# Patient Record
Sex: Female | Born: 1950
Health system: Southern US, Community
[De-identification: ages and names within clinical notes are randomized; demographics above are authoritative.]

## PROBLEM LIST (undated history)

## (undated) DIAGNOSIS — N6310 Unspecified lump in the right breast, unspecified quadrant: Secondary | ICD-10-CM

## (undated) DIAGNOSIS — E785 Hyperlipidemia, unspecified: Secondary | ICD-10-CM

## (undated) DIAGNOSIS — E559 Vitamin D deficiency, unspecified: Secondary | ICD-10-CM

## (undated) DIAGNOSIS — M810 Age-related osteoporosis without current pathological fracture: Secondary | ICD-10-CM

## (undated) HISTORY — DX: Hyperlipidemia, unspecified: E78.5

## (undated) HISTORY — PX: HERNIA REPAIR: SHX51

## (undated) HISTORY — DX: Age-related osteoporosis without current pathological fracture: M81.0

## (undated) HISTORY — PX: BREAST SURGERY: SHX581

## (undated) HISTORY — PX: COLONOSCOPY: SHX174

## (undated) HISTORY — PX: BREAST EXCISIONAL BIOPSY: SUR124

## (undated) HISTORY — DX: Vitamin D deficiency, unspecified: E55.9

---

## 1998-03-20 ENCOUNTER — Other Ambulatory Visit: Admission: RE | Admit: 1998-03-20 | Discharge: 1998-03-20 | Payer: Self-pay | Admitting: Obstetrics and Gynecology

## 1999-05-19 ENCOUNTER — Other Ambulatory Visit: Admission: RE | Admit: 1999-05-19 | Discharge: 1999-05-19 | Payer: Self-pay | Admitting: Obstetrics and Gynecology

## 2000-06-09 ENCOUNTER — Other Ambulatory Visit: Admission: RE | Admit: 2000-06-09 | Discharge: 2000-06-09 | Payer: Self-pay | Admitting: Obstetrics and Gynecology

## 2002-01-23 ENCOUNTER — Other Ambulatory Visit: Admission: RE | Admit: 2002-01-23 | Discharge: 2002-01-23 | Payer: Self-pay | Admitting: Obstetrics and Gynecology

## 2002-12-02 ENCOUNTER — Emergency Department (HOSPITAL_COMMUNITY): Admission: EM | Admit: 2002-12-02 | Discharge: 2002-12-02 | Payer: Self-pay | Admitting: *Deleted

## 2003-02-07 ENCOUNTER — Other Ambulatory Visit: Admission: RE | Admit: 2003-02-07 | Discharge: 2003-02-07 | Payer: Self-pay | Admitting: Obstetrics and Gynecology

## 2003-06-11 ENCOUNTER — Ambulatory Visit (HOSPITAL_COMMUNITY): Admission: RE | Admit: 2003-06-11 | Discharge: 2003-06-11 | Payer: Self-pay | Admitting: Gastroenterology

## 2003-06-11 ENCOUNTER — Encounter (INDEPENDENT_AMBULATORY_CARE_PROVIDER_SITE_OTHER): Payer: Self-pay | Admitting: *Deleted

## 2004-02-18 ENCOUNTER — Other Ambulatory Visit: Admission: RE | Admit: 2004-02-18 | Discharge: 2004-02-18 | Payer: Self-pay | Admitting: Obstetrics and Gynecology

## 2005-03-05 ENCOUNTER — Other Ambulatory Visit: Admission: RE | Admit: 2005-03-05 | Discharge: 2005-03-05 | Payer: Self-pay | Admitting: Obstetrics and Gynecology

## 2006-08-25 ENCOUNTER — Other Ambulatory Visit: Admission: RE | Admit: 2006-08-25 | Discharge: 2006-08-25 | Payer: Self-pay | Admitting: Obstetrics and Gynecology

## 2009-02-06 LAB — HM DEXA SCAN

## 2010-06-30 ENCOUNTER — Encounter: Payer: Self-pay | Admitting: Nurse Practitioner

## 2010-06-30 DIAGNOSIS — M8580 Other specified disorders of bone density and structure, unspecified site: Secondary | ICD-10-CM | POA: Insufficient documentation

## 2010-06-30 DIAGNOSIS — E785 Hyperlipidemia, unspecified: Secondary | ICD-10-CM | POA: Insufficient documentation

## 2010-06-30 DIAGNOSIS — E559 Vitamin D deficiency, unspecified: Secondary | ICD-10-CM | POA: Insufficient documentation

## 2010-07-25 NOTE — Op Note (Signed)
NAME:  CROWDERCherylee, Rawlinson                         ACCOUNT NO.:  [id number]   MEDICAL RECORD NO.:  [id number]                   PATIENT TYPE:  AMB   LOCATION:  ENDO                                 FACILITY:  [facility]   PHYSICIAN:  Anselmo Rod, M.D.               DATE OF BIRTH:  1950-04-11   DATE OF PROCEDURE:  06/11/2003  DATE OF DISCHARGE:                                 OPERATIVE REPORT   PROCEDURE PERFORMED:  Colonoscopy with cold biopsies times two.   ENDOSCOPIST:  Charna Elizabeth, M.D.   INSTRUMENT USED:  Olympus video colonoscope.   INDICATIONS FOR PROCEDURE:  The patient is a 60 year old white female with a  history of guaiac positive stool undergoing screening colonoscopy.  Rule out  colonic polyps, masses, etc.   PREPROCEDURE PREPARATION:  Informed consent was procured from the patient.  The patient was fasted for eight hours prior to the procedure and prepped  with a bottle of magnesium citrate and a gallon of GoLYTELY the night prior  to the procedure.   PREPROCEDURE PHYSICAL:  The patient had stable vital signs.  Neck supple.  Chest clear to auscultation.  S1 and S2 regular.  Abdomen soft with normal  bowel sounds.   DESCRIPTION OF PROCEDURE:  The patient was placed in left lateral decubitus  position and sedated with 70 mg of Demerol and 7 mg of Versed intravenously.  Once the patient was adequately sedated and maintained on low flow oxygen  and continuous cardiac monitoring, the Olympus video colonoscope was  advanced from the rectum to the cecum.  A small sessile polyp was biopsied  from the rectum.  The rest of the exam up to the cecum was normal.  The  appendicular orifice and ileocecal valve were clearly visualized and  photographed.  The patient tolerated the procedure well without immediate  complications.   IMPRESSION:  Normal colonoscopy up to the cecum except for small polyp  biopsied from the rectum.   RECOMMENDATIONS:  1. Await pathology results.  2.  Outpatient followup and repeat guaiac testing.  3. Repeat CRC screening depending on pathology results.                                               Anselmo Rod, M.D.   JNM/MEDQ  D:  06/11/2003  T:  06/12/2003  Job:  [id number]   cc:   Fayrene Fearing A. Ashley Royalty, M.D.  [address]  [place], [place] [address]  Fax: [phone]

## 2010-12-19 ENCOUNTER — Other Ambulatory Visit: Payer: Self-pay | Admitting: Obstetrics and Gynecology

## 2012-06-06 ENCOUNTER — Other Ambulatory Visit: Payer: Self-pay

## 2012-06-06 MED ORDER — EZETIMIBE 10 MG PO TABS: 10.0000 mg | ORAL_TABLET | Freq: Every day | ORAL | Status: DC

## 2012-06-20 ENCOUNTER — Other Ambulatory Visit: Payer: Self-pay

## 2012-06-20 MED ORDER — FENOFIBRATE 160 MG PO TABS: 160.0000 mg | ORAL_TABLET | Freq: Every day | ORAL | Status: DC

## 2012-07-04 ENCOUNTER — Ambulatory Visit (INDEPENDENT_AMBULATORY_CARE_PROVIDER_SITE_OTHER): Payer: BC Managed Care – PPO | Admitting: Nurse Practitioner

## 2012-07-04 ENCOUNTER — Encounter: Payer: Self-pay | Admitting: Nurse Practitioner

## 2012-07-04 VITALS — BP 127/83 | HR 78 | Temp 98.0°F | Ht 65.0 in | Wt 155.0 lb

## 2012-07-04 DIAGNOSIS — E785 Hyperlipidemia, unspecified: Secondary | ICD-10-CM

## 2012-07-04 DIAGNOSIS — E559 Vitamin D deficiency, unspecified: Secondary | ICD-10-CM

## 2012-07-04 LAB — COMPLETE METABOLIC PANEL WITH GFR
ALT: 19 U/L (ref 0–35)
AST: 18 U/L (ref 0–37)
Alkaline Phosphatase: 33 U/L — ABNORMAL LOW (ref 39–117)
BUN: 25 mg/dL — ABNORMAL HIGH (ref 6–23)
Calcium: 9.6 mg/dL (ref 8.4–10.5)
Creat: 0.95 mg/dL (ref 0.50–1.10)
Total Bilirubin: 0.4 mg/dL (ref 0.3–1.2)

## 2012-07-04 MED ORDER — EZETIMIBE 10 MG PO TABS: 10.0000 mg | ORAL_TABLET | Freq: Every day | ORAL | Status: DC

## 2012-07-04 MED ORDER — FENOFIBRATE 160 MG PO TABS: 160.0000 mg | ORAL_TABLET | Freq: Every day | ORAL | Status: DC

## 2012-07-04 NOTE — Patient Instructions (Signed)

## 2012-07-04 NOTE — Progress Notes (Signed)
  Subjective:    Patient ID: Lori Ross, female    DOB: 05/17/1950, 62 y.o.   MRN: [id number]  Hyperlipidemia This is a chronic problem. The current episode started more than 1 year ago. The problem is uncontrolled. Recent lipid tests were reviewed and are high. There are no known factors aggravating her hyperlipidemia. Pertinent negatives include no chest pain, focal sensory loss, focal weakness, leg pain or shortness of breath. Current antihyperlipidemic treatment includes ezetimibe and fibric acid derivatives (can't tolerate statin). The current treatment provides moderate improvement of lipids. Compliance problems include adherence to diet.  Risk factors for coronary artery disease include post-menopausal.  Vit D deficiency Vitamin D OTC. No c/o of fatigue.    Review of Systems  Constitutional: Negative.   Respiratory: Negative for shortness of breath.   Cardiovascular: Negative for chest pain, palpitations and leg swelling.  Gastrointestinal: Negative.   Genitourinary: Negative.   Neurological: Negative.  Negative for focal weakness.  Psychiatric/Behavioral: Negative.        Objective:   Physical Exam  Constitutional: She is oriented to person, place, and time. She appears well-developed and well-nourished.  HENT:  Head: Normocephalic.  Right Ear: Hearing, tympanic membrane, external ear and ear canal normal.  Left Ear: Hearing, tympanic membrane, external ear and ear canal normal.  Nose: Nose normal.  Mouth/Throat: Uvula is midline, oropharynx is clear and moist and mucous membranes are normal.  Eyes: Conjunctivae and EOM are normal. Pupils are equal, round, and reactive to light.  Neck: Normal range of motion. Neck supple. No JVD present. Carotid bruit is not present. No thyromegaly present.  Cardiovascular: Normal rate, normal heart sounds and intact distal pulses.   No murmur heard. Pulmonary/Chest: Effort normal and breath sounds normal. She has no wheezes. She has no  rales.  Abdominal: Soft. Bowel sounds are normal. She exhibits no mass. There is no tenderness.  Musculoskeletal: Normal range of motion.  Neurological: She is alert and oriented to person, place, and time. She has normal reflexes.  Skin: Skin is warm and dry.  Psychiatric: She has a normal mood and affect. Her behavior is normal. Judgment and thought content normal.  BP 127/83  Pulse 78  Temp(Src) 98 F (36.7 C) (Oral)  Ht 5\' 5"  (1.651 m)  Wt 155 lb (70.308 kg)  BMI 25.79 kg/m2         Assessment & Plan:  1. Hyperlipidemia Low fat diet and exercise - COMPLETE METABOLIC PANEL WITH GFR - NMR Lipoprofile with Lipids - fenofibrate 160 MG tablet; Take 1 tablet (160 mg total) by mouth daily.  Dispense: 30 tablet; Refill: 5 - ezetimibe (ZETIA) 10 MG tablet; Take 1 tablet (10 mg total) by mouth daily.  Dispense: 30 tablet; Refill: 5  2. Unspecified vitamin D deficiency Continue Vit d as directed  Mary-Margaret Daphine Deutscher, FNP

## 2012-07-05 LAB — NMR LIPOPROFILE WITH LIPIDS
Cholesterol, Total: 179 mg/dL (ref <200)
HDL Particle Number: 45.1 umol/L (ref >=30.5)
HDL-C: 49 mg/dL (ref >=40)
LDL (calc): 102 mg/dL — ABNORMAL HIGH (ref <100)
LDL Particle Number: 1761 nmol/L — ABNORMAL HIGH (ref <1000)
LP-IR Score: 83 — ABNORMAL HIGH (ref <=45)
Small LDL Particle Number: 1052 nmol/L — ABNORMAL HIGH (ref <=527)
Triglycerides: 139 mg/dL (ref <150)
VLDL Size: 54.8 nm — ABNORMAL HIGH (ref <=46.6)

## 2012-07-05 LAB — VITAMIN D 25 HYDROXY (VIT D DEFICIENCY, FRACTURES): Vit D, 25-Hydroxy: 39 ng/mL (ref 30–89)

## 2012-10-05 ENCOUNTER — Encounter: Payer: Self-pay | Admitting: Nurse Practitioner

## 2012-10-05 ENCOUNTER — Ambulatory Visit (INDEPENDENT_AMBULATORY_CARE_PROVIDER_SITE_OTHER): Payer: BC Managed Care – PPO | Admitting: Nurse Practitioner

## 2012-10-05 VITALS — BP 130/84 | HR 74 | Temp 97.5°F | Ht 65.0 in | Wt 153.5 lb

## 2012-10-05 DIAGNOSIS — E785 Hyperlipidemia, unspecified: Secondary | ICD-10-CM

## 2012-10-05 MED ORDER — EZETIMIBE 10 MG PO TABS: 10.0000 mg | ORAL_TABLET | Freq: Every day | ORAL | Status: DC

## 2012-10-05 MED ORDER — FENOFIBRATE 160 MG PO TABS: 160.0000 mg | ORAL_TABLET | Freq: Every day | ORAL | Status: DC

## 2012-10-05 NOTE — Progress Notes (Signed)
  Subjective:    Patient ID: Lori Ross, female    DOB: 02/15/1951, 62 y.o.   MRN: [id number]  Hyperlipidemia This is a chronic problem. The current episode started more than 1 year ago. The problem is uncontrolled. Recent lipid tests were reviewed and are high. She has no history of diabetes or liver disease. Pertinent negatives include no leg pain, myalgias or shortness of breath. Current antihyperlipidemic treatment includes fibric acid derivatives and ezetimibe. The current treatment provides mild improvement of lipids. Compliance problems include medication side effects.  Risk factors for coronary artery disease include family history and dyslipidemia.      Review of Systems  Respiratory: Negative for shortness of breath.   Musculoskeletal: Negative for myalgias.  All other systems reviewed and are negative.       Objective:   Physical Exam  Vitals reviewed. Constitutional: She is oriented to person, place, and time. She appears well-developed and well-nourished.  HENT:  Head: Normocephalic.  Right Ear: External ear normal.  Left Ear: External ear normal.  Mouth/Throat: Oropharynx is clear and moist.  Eyes: Pupils are equal, round, and reactive to light.  Neck: Normal range of motion. Neck supple. No thyromegaly present.  Cardiovascular: Normal rate, regular rhythm, normal heart sounds and intact distal pulses.   Pulmonary/Chest: Effort normal and breath sounds normal.  Abdominal: Soft. Bowel sounds are normal. She exhibits no distension. There is no tenderness.  Musculoskeletal: Normal range of motion. She exhibits no edema and no tenderness.  Neurological: She is alert and oriented to person, place, and time.  Skin: Skin is warm and dry.  Psychiatric: She has a normal mood and affect. Her behavior is normal. Judgment and thought content normal.    BP 130/84  Pulse 74  Temp(Src) 97.5 F (36.4 C) (Oral)  Ht 5\' 5"  (1.651 m)  Wt 153 lb 8 oz (69.627 kg)  BMI 25.54  kg/m2       Assessment & Plan:  1. Hyperlipidemia *Low fat diet and exercise - CMP14+EGFR - NMR, lipoprofile - fenofibrate 160 MG tablet; Take 1 tablet (160 mg total) by mouth daily.  Dispense: 30 tablet; Refill: 5 - ezetimibe (ZETIA) 10 MG tablet; Take 1 tablet (10 mg total) by mouth daily.  Dispense: 30 tablet; Refill: 5 Follow up in 6 months Mary-Margaret Daphine Deutscher, FNP

## 2012-10-05 NOTE — Patient Instructions (Addendum)

## 2012-10-11 LAB — NMR, LIPOPROFILE
Cholesterol: 179 mg/dL (ref <200)
HDL Cholesterol by NMR: 44 mg/dL (ref >=40)
LDL Particle Number: 1675 nmol/L — ABNORMAL HIGH (ref <1000)
LDLC SERPL CALC-MCNC: 95 mg/dL (ref <100)
LP-IR Score: 74 — ABNORMAL HIGH (ref <=45)

## 2012-10-11 LAB — CMP14+EGFR
AST: 25 IU/L (ref 0–40)
Albumin: 4.7 g/dL (ref 3.6–4.8)
Alkaline Phosphatase: 39 IU/L (ref 39–117)
BUN/Creatinine Ratio: 27 — ABNORMAL HIGH (ref 11–26)
BUN: 25 mg/dL (ref 8–27)
CO2: 26 mmol/L (ref 18–29)
Calcium: 10.3 mg/dL — ABNORMAL HIGH (ref 8.6–10.2)
Chloride: 103 mmol/L (ref 97–108)

## 2012-10-12 ENCOUNTER — Other Ambulatory Visit: Payer: Self-pay | Admitting: Nurse Practitioner

## 2012-10-12 MED ORDER — ATORVASTATIN CALCIUM 40 MG PO TABS: 40.0000 mg | ORAL_TABLET | Freq: Every day | ORAL | Status: DC

## 2012-10-17 ENCOUNTER — Other Ambulatory Visit (INDEPENDENT_AMBULATORY_CARE_PROVIDER_SITE_OTHER): Payer: BC Managed Care – PPO

## 2012-10-17 DIAGNOSIS — Z1212 Encounter for screening for malignant neoplasm of rectum: Secondary | ICD-10-CM

## 2012-10-17 NOTE — Progress Notes (Signed)
Pt dropped off FOBT  

## 2012-10-19 LAB — FECAL OCCULT BLOOD, IMMUNOCHEMICAL: Fecal Occult Bld: NEGATIVE

## 2013-01-06 ENCOUNTER — Encounter: Payer: Self-pay | Admitting: Nurse Practitioner

## 2013-01-06 ENCOUNTER — Ambulatory Visit (INDEPENDENT_AMBULATORY_CARE_PROVIDER_SITE_OTHER): Payer: BC Managed Care – PPO | Admitting: Nurse Practitioner

## 2013-01-06 VITALS — BP 128/86 | HR 77 | Temp 97.8°F | Ht 65.0 in | Wt 154.0 lb

## 2013-01-06 DIAGNOSIS — E785 Hyperlipidemia, unspecified: Secondary | ICD-10-CM

## 2013-01-06 DIAGNOSIS — E559 Vitamin D deficiency, unspecified: Secondary | ICD-10-CM

## 2013-01-06 NOTE — Patient Instructions (Signed)

## 2013-01-06 NOTE — Progress Notes (Signed)
  Subjective:    Patient ID: Lori Ross, female    DOB: [birth year], 62 y.o.   MRN: [id number]  Hyperlipidemia This is a chronic problem. The current episode started more than 1 year ago. The problem is uncontrolled. Recent lipid tests were reviewed and are high. She has no history of diabetes. There are no known factors aggravating her hyperlipidemia. Associated symptoms include leg pain and myalgias. Pertinent negatives include no chest pain, focal sensory loss, focal weakness or shortness of breath. Current antihyperlipidemic treatment includes ezetimibe, fibric acid derivatives and statins. The current treatment provides moderate improvement of lipids. Compliance problems include adherence to diet and medication side effects.  Risk factors for coronary artery disease include post-menopausal and dyslipidemia.  Vit D deficiency Vitamin D OTC. No c/o of fatigue.    Review of Systems  Constitutional: Negative.   Respiratory: Negative for shortness of breath.   Cardiovascular: Negative for chest pain, palpitations and leg swelling.  Gastrointestinal: Negative.   Genitourinary: Negative.   Musculoskeletal: Positive for myalgias.  Neurological: Negative.  Negative for focal weakness.  Psychiatric/Behavioral: Negative.   All other systems reviewed and are negative.       Objective:   Physical Exam  Constitutional: She is oriented to person, place, and time. She appears well-developed and well-nourished.  HENT:  Head: Normocephalic.  Right Ear: Hearing, tympanic membrane, external ear and ear canal normal.  Left Ear: Hearing, tympanic membrane, external ear and ear canal normal.  Nose: Nose normal.  Mouth/Throat: Uvula is midline, oropharynx is clear and moist and mucous membranes are normal.  Eyes: Conjunctivae and EOM are normal. Pupils are equal, round, and reactive to light.  Neck: Normal range of motion. Neck supple. No JVD present. Carotid bruit is not present. No thyromegaly  present.  Cardiovascular: Normal rate, normal heart sounds and intact distal pulses.   No murmur heard. Pulmonary/Chest: Effort normal and breath sounds normal. She has no wheezes. She has no rales.  Abdominal: Soft. Bowel sounds are normal. She exhibits no mass. There is no tenderness.  Musculoskeletal: Normal range of motion.  Neurological: She is alert and oriented to person, place, and time. She has normal reflexes.  Skin: Skin is warm and dry.  Psychiatric: She has a normal mood and affect. Her behavior is normal. Judgment and thought content normal.  BP 128/86  Pulse 77  Temp(Src) 97.8 F (36.6 C) (Oral)  Ht 5\' 5"  (1.651 m)  Wt 154 lb (69.854 kg)  BMI 25.63 kg/m2         Assessment & Plan:   1. Hyperlipidemia LDL goal < 100   2. Unspecified vitamin D deficiency    Orders Placed This Encounter  Procedures  . CMP14+EGFR  . NMR, lipoprofile  . Vit D  25 hydroxy (rtn osteoporosis monitoring)    Continue all meds Labs pending Diet and exercise encouraged Health maintenance reviewed Follow up in 3 months  Mary-Margaret Daphine Deutscher, FNP

## 2013-01-08 LAB — CMP14+EGFR
ALT: 27 IU/L (ref 0–32)
AST: 27 IU/L (ref 0–40)
Albumin/Globulin Ratio: 1.8 (ref 1.1–2.5)
CO2: 24 mmol/L (ref 18–29)
Calcium: 10.3 mg/dL — ABNORMAL HIGH (ref 8.6–10.2)
Creatinine, Ser: 0.78 mg/dL (ref 0.57–1.00)
GFR calc non Af Amer: 82 mL/min/{1.73_m2} (ref >59)
Globulin, Total: 2.6 g/dL (ref 1.5–4.5)
Glucose: 93 mg/dL (ref 65–99)
Potassium: 5 mmol/L (ref 3.5–5.2)
Sodium: 142 mmol/L (ref 134–144)
Total Protein: 7.4 g/dL (ref 6.0–8.5)

## 2013-01-08 LAB — VITAMIN D 25 HYDROXY (VIT D DEFICIENCY, FRACTURES): Vit D, 25-Hydroxy: 35 ng/mL (ref 30.0–100.0)

## 2013-01-08 LAB — NMR, LIPOPROFILE
HDL Cholesterol by NMR: 45 mg/dL (ref >=40)
LDLC SERPL CALC-MCNC: 62 mg/dL (ref <100)
Small LDL Particle Number: 574 nmol/L — ABNORMAL HIGH (ref <=527)
Triglycerides by NMR: 83 mg/dL (ref <150)

## 2013-01-09 ENCOUNTER — Encounter: Payer: Self-pay | Admitting: Nurse Practitioner

## 2013-01-11 ENCOUNTER — Ambulatory Visit: Payer: BC Managed Care – PPO | Admitting: Nurse Practitioner

## 2013-01-12 ENCOUNTER — Other Ambulatory Visit: Payer: Self-pay | Admitting: Obstetrics and Gynecology

## 2013-01-27 ENCOUNTER — Ambulatory Visit: Payer: BC Managed Care – PPO | Admitting: Nurse Practitioner

## 2013-04-11 ENCOUNTER — Ambulatory Visit: Payer: BC Managed Care – PPO | Admitting: Nurse Practitioner

## 2013-05-17 ENCOUNTER — Ambulatory Visit (INDEPENDENT_AMBULATORY_CARE_PROVIDER_SITE_OTHER): Payer: BC Managed Care – PPO | Admitting: Nurse Practitioner

## 2013-05-17 ENCOUNTER — Encounter: Payer: Self-pay | Admitting: Nurse Practitioner

## 2013-05-17 VITALS — BP 127/84 | HR 79 | Temp 98.0°F | Ht 65.0 in | Wt 159.8 lb

## 2013-05-17 DIAGNOSIS — Z23 Encounter for immunization: Secondary | ICD-10-CM

## 2013-05-17 DIAGNOSIS — E559 Vitamin D deficiency, unspecified: Secondary | ICD-10-CM

## 2013-05-17 DIAGNOSIS — M899 Disorder of bone, unspecified: Secondary | ICD-10-CM

## 2013-05-17 DIAGNOSIS — M8580 Other specified disorders of bone density and structure, unspecified site: Secondary | ICD-10-CM

## 2013-05-17 DIAGNOSIS — M949 Disorder of cartilage, unspecified: Secondary | ICD-10-CM

## 2013-05-17 DIAGNOSIS — E785 Hyperlipidemia, unspecified: Secondary | ICD-10-CM

## 2013-05-17 NOTE — Patient Instructions (Signed)

## 2013-05-17 NOTE — Progress Notes (Signed)
Subjective:    Patient ID: Lori Ross, female    DOB: [birth year], 63 y.o.   MRN: [id number]  Patient presents today for follow-up of chronic illnesses. Patient has concerns about "puffy" area on top of both hands. States it has been present for about 1 month. Denies any pain. No treatment tried.  Hyperlipidemia This is a chronic problem. The current episode started more than 1 year ago. The problem is uncontrolled. Recent lipid tests were reviewed and are high. She has no history of diabetes. There are no known factors aggravating her hyperlipidemia. Associated symptoms include leg pain and myalgias. Pertinent negatives include no chest pain, focal sensory loss, focal weakness or shortness of breath. Current antihyperlipidemic treatment includes ezetimibe, fibric acid derivatives and statins. The current treatment provides moderate improvement of lipids. Compliance problems include adherence to diet and medication side effects.  Risk factors for coronary artery disease include post-menopausal and dyslipidemia.  Patient has stopped Lipitor due to leg pain and feels she it affects her memory more than usual as well as a twitching eye. Patient has tried several different types of statins but feels they all affect her the same after 1 month of taming them. Vit D deficiency Vitamin D with calcium OTC. No c/o of fatigue.    Review of Systems  Constitutional: Negative.   Respiratory: Negative for shortness of breath.   Cardiovascular: Negative for chest pain, palpitations and leg swelling.  Gastrointestinal: Negative.   Genitourinary: Negative.   Musculoskeletal: Positive for myalgias.  Neurological: Negative.  Negative for focal weakness.  Psychiatric/Behavioral: Negative.  Negative for sleep disturbance.  All other systems reviewed and are negative.       Objective:   Physical Exam  Constitutional: She is oriented to person, place, and time. She appears well-developed and well-nourished.   HENT:  Head: Normocephalic.  Right Ear: Hearing, tympanic membrane, external ear and ear canal normal.  Left Ear: Hearing, tympanic membrane, external ear and ear canal normal.  Mouth/Throat: Uvula is midline, oropharynx is clear and moist and mucous membranes are normal.  Eyes: Pupils are equal, round, and reactive to light.  Neck: Trachea normal, normal range of motion and full passive range of motion without pain. Neck supple. No JVD present. Carotid bruit is not present. No thyromegaly present.  Cardiovascular: Normal rate, regular rhythm, normal heart sounds and intact distal pulses.  Exam reveals no gallop and no friction rub.   No murmur heard. Pulmonary/Chest: Effort normal and breath sounds normal. She has no wheezes. She has no rales.  Abdominal: Soft. Bowel sounds are normal. She exhibits no distension and no mass. There is no tenderness.  Musculoskeletal: Normal range of motion.  Lymphadenopathy:    She has no cervical adenopathy.  Neurological: She is alert and oriented to person, place, and time.  Skin: Skin is warm and dry.  Psychiatric: She has a normal mood and affect. Her behavior is normal. Judgment and thought content normal.   BP 127/84  Pulse 79  Temp(Src) 98 F (36.7 C) (Oral)  Ht 5' 5"  (1.651 m)  Wt 159 lb 12.8 oz (72.485 kg)  BMI 26.59 kg/m2       Assessment & Plan:   1. Osteopenia   2. Hyperlipidemia   3. Vitamin D deficiency    Orders Placed This Encounter  Procedures  . CMP14+EGFR  . NMR, lipoprofile    Labs pending Health maintenance reviewed Diet and exercise encouraged Discussed possibility of changing Liptor pending labs Follow up  In  3 months   [place], FNP

## 2013-05-19 LAB — CMP14+EGFR
ALK PHOS: 41 IU/L (ref 39–117)
ALT: 23 IU/L (ref 0–32)
AST: 26 IU/L (ref 0–40)
Albumin/Globulin Ratio: 1.7 (ref 1.1–2.5)
Albumin: 4.6 g/dL (ref 3.6–4.8)
BUN / CREAT RATIO: 27 — AB (ref 11–26)
BUN: 25 mg/dL (ref 8–27)
CHLORIDE: 100 mmol/L (ref 97–108)
CO2: 24 mmol/L (ref 18–29)
Calcium: 10.2 mg/dL (ref 8.7–10.3)
Creatinine, Ser: 0.94 mg/dL (ref 0.57–1.00)
GFR calc Af Amer: 75 mL/min/{1.73_m2} (ref >59)
GFR calc non Af Amer: 65 mL/min/{1.73_m2} (ref >59)
Globulin, Total: 2.7 g/dL (ref 1.5–4.5)
Glucose: 98 mg/dL (ref 65–99)
POTASSIUM: 5.1 mmol/L (ref 3.5–5.2)
SODIUM: 140 mmol/L (ref 134–144)
Total Bilirubin: 0.4 mg/dL (ref 0.0–1.2)
Total Protein: 7.3 g/dL (ref 6.0–8.5)

## 2013-05-19 LAB — NMR, LIPOPROFILE
Cholesterol: 184 mg/dL (ref <200)
HDL Cholesterol by NMR: 46 mg/dL (ref >=40)
HDL Particle Number: 39 umol/L (ref >=30.5)
LDL PARTICLE NUMBER: 1719 nmol/L — AB (ref <1000)
LDL Size: 21 nm (ref >20.5)
LDLC SERPL CALC-MCNC: 113 mg/dL — AB (ref <100)
LP-IR SCORE: 74 — AB (ref <=45)
Small LDL Particle Number: 681 nmol/L — ABNORMAL HIGH (ref <=527)
Triglycerides by NMR: 124 mg/dL (ref <150)

## 2013-08-24 ENCOUNTER — Encounter: Payer: Self-pay | Admitting: Nurse Practitioner

## 2013-08-24 ENCOUNTER — Ambulatory Visit (INDEPENDENT_AMBULATORY_CARE_PROVIDER_SITE_OTHER): Payer: BC Managed Care – PPO | Admitting: Nurse Practitioner

## 2013-08-24 VITALS — BP 130/78 | HR 77 | Temp 99.1°F | Ht 64.0 in | Wt 157.6 lb

## 2013-08-24 DIAGNOSIS — M8580 Other specified disorders of bone density and structure, unspecified site: Secondary | ICD-10-CM

## 2013-08-24 DIAGNOSIS — M949 Disorder of cartilage, unspecified: Secondary | ICD-10-CM

## 2013-08-24 DIAGNOSIS — M899 Disorder of bone, unspecified: Secondary | ICD-10-CM

## 2013-08-24 DIAGNOSIS — R609 Edema, unspecified: Secondary | ICD-10-CM

## 2013-08-24 DIAGNOSIS — E785 Hyperlipidemia, unspecified: Secondary | ICD-10-CM

## 2013-08-24 DIAGNOSIS — E559 Vitamin D deficiency, unspecified: Secondary | ICD-10-CM

## 2013-08-24 MED ORDER — FUROSEMIDE 20 MG PO TABS: ORAL_TABLET | ORAL | Status: DC

## 2013-08-24 MED ORDER — FENOFIBRATE 160 MG PO TABS: 160.0000 mg | ORAL_TABLET | Freq: Every day | ORAL | Status: DC

## 2013-08-24 NOTE — Patient Instructions (Signed)

## 2013-08-24 NOTE — Progress Notes (Signed)
Subjective:    Patient ID: Lori Ross, female    DOB: 1950/05/10, 63 y.o.   MRN: [id number]  Patient here today for follow up of chronic medical problems. No complaints today. She does c/o slight fluid build up mainly in her hands.   Hyperlipidemia This is a chronic problem. The current episode started more than 1 year ago. The problem is uncontrolled. Recent lipid tests were reviewed and are high. She has no history of diabetes. There are no known factors aggravating her hyperlipidemia. Associated symptoms include leg pain and myalgias. Pertinent negatives include no chest pain, focal sensory loss, focal weakness or shortness of breath. Current antihyperlipidemic treatment includes ezetimibe, fibric acid derivatives and statins. The current treatment provides moderate improvement of lipids. Compliance problems include adherence to diet and medication side effects.  Risk factors for coronary artery disease include post-menopausal and dyslipidemia.  Patient has stopped Lipitor due to leg pain and feels she it affects her memory more than usual as well as a twitching eye. Patient has tried several different types of statins but feels they all affect her the same after 1 month of taming them. Vit D deficiency Vitamin D with calcium OTC. No c/o of fatigue.    Review of Systems  Constitutional: Negative.   Respiratory: Negative for shortness of breath.   Cardiovascular: Negative for chest pain, palpitations and leg swelling.  Gastrointestinal: Negative.   Genitourinary: Negative.   Musculoskeletal: Positive for myalgias.  Neurological: Negative.  Negative for focal weakness.  Psychiatric/Behavioral: Negative.  Negative for sleep disturbance.  All other systems reviewed and are negative.      Objective:   Physical Exam  Constitutional: She is oriented to person, place, and time. She appears well-developed and well-nourished.  HENT:  Head: Normocephalic.  Right Ear: Hearing, tympanic  membrane, external ear and ear canal normal.  Left Ear: Hearing, tympanic membrane, external ear and ear canal normal.  Mouth/Throat: Uvula is midline, oropharynx is clear and moist and mucous membranes are normal.  Eyes: Pupils are equal, round, and reactive to light.  Neck: Trachea normal, normal range of motion and full passive range of motion without pain. Neck supple. No JVD present. Carotid bruit is not present. No thyromegaly present.  Cardiovascular: Normal rate, regular rhythm, normal heart sounds and intact distal pulses.  Exam reveals no gallop and no friction rub.   No murmur heard. Pulmonary/Chest: Effort normal and breath sounds normal. She has no wheezes. She has no rales.  Abdominal: Soft. Bowel sounds are normal. She exhibits no distension and no mass. There is no tenderness.  Musculoskeletal: Normal range of motion.  Lymphadenopathy:    She has no cervical adenopathy.  Neurological: She is alert and oriented to person, place, and time.  Skin: Skin is warm and dry.  Psychiatric: She has a normal mood and affect. Her behavior is normal. Judgment and thought content normal.   BP 130/78  Pulse 77  Temp(Src) 99.1 F (37.3 C) (Oral)  Ht 5' 4" (1.626 m)  Wt 157 lb 9.6 oz (71.487 kg)  BMI 27.04 kg/m2       Assessment & Plan:   1. Hyperlipidemia   2. Vitamin D deficiency   3. Osteopenia   4. Peripheral edema    Orders Placed This Encounter  Procedures  . DG Bone Density    Standing Status: Future     Number of Occurrences:      Standing Expiration Date: 10/24/2014    Order Specific Question:  Reason  for Exam (SYMPTOM  OR DIAGNOSIS REQUIRED)    Answer:  screening    Order Specific Question:  Preferred imaging location?    Answer:  Internal  . CMP14+EGFR  . NMR, lipoprofile  . Vit D  25 hydroxy (rtn osteoporosis monitoring)   Meds ordered this encounter  Medications  . fenofibrate 160 MG tablet    Sig: Take 1 tablet (160 mg total) by mouth daily.    Dispense:   30 tablet    Refill:  5    Order Specific Question:  Supervising Provider    Answer:  Chipper Herb [[id number]]  . furosemide (LASIX) 20 MG tablet    Sig: 1/2 - 1 po qd prn    Dispense:  30 tablet    Refill:  3    Order Specific Question:  Supervising Provider    Answer:  Chipper Herb [[id number]]    Labs pending Health maintenance reviewed Diet and exercise encouraged Continue all meds Follow up  In 3 months   [place], FNP

## 2013-08-25 ENCOUNTER — Telehealth: Payer: Self-pay | Admitting: Nurse Practitioner

## 2013-08-25 LAB — CMP14+EGFR
A/G RATIO: 1.8 (ref 1.1–2.5)
ALK PHOS: 40 IU/L (ref 39–117)
ALT: 32 IU/L (ref 0–32)
AST: 30 IU/L (ref 0–40)
Albumin: 4.7 g/dL (ref 3.6–4.8)
BUN/Creatinine Ratio: 23 (ref 11–26)
BUN: 25 mg/dL (ref 8–27)
CO2: 24 mmol/L (ref 18–29)
CREATININE: 1.08 mg/dL — AB (ref 0.57–1.00)
Calcium: 10.2 mg/dL (ref 8.7–10.3)
Chloride: 103 mmol/L (ref 97–108)
GFR calc Af Amer: 64 mL/min/{1.73_m2} (ref >59)
GFR, EST NON AFRICAN AMERICAN: 55 mL/min/{1.73_m2} — AB (ref >59)
GLOBULIN, TOTAL: 2.6 g/dL (ref 1.5–4.5)
Glucose: 98 mg/dL (ref 65–99)
Potassium: 5.4 mmol/L — ABNORMAL HIGH (ref 3.5–5.2)
SODIUM: 142 mmol/L (ref 134–144)
Total Bilirubin: 0.4 mg/dL (ref 0.0–1.2)
Total Protein: 7.3 g/dL (ref 6.0–8.5)

## 2013-08-25 LAB — NMR, LIPOPROFILE
Cholesterol: 207 mg/dL — ABNORMAL HIGH (ref 100–199)
HDL CHOLESTEROL BY NMR: 36 mg/dL — AB (ref >39)
HDL PARTICLE NUMBER: 33.1 umol/L (ref >=30.5)
LDL Particle Number: 1995 nmol/L — ABNORMAL HIGH (ref <1000)
LDL Size: 20.5 nm (ref >20.5)
LDLC SERPL CALC-MCNC: 134 mg/dL — ABNORMAL HIGH (ref 0–99)
LP-IR Score: 81 — ABNORMAL HIGH (ref <=45)
SMALL LDL PARTICLE NUMBER: 1133 nmol/L — AB (ref <=527)
Triglycerides by NMR: 186 mg/dL — ABNORMAL HIGH (ref 0–149)

## 2013-08-25 LAB — VITAMIN D 25 HYDROXY (VIT D DEFICIENCY, FRACTURES): Vit D, 25-Hydroxy: 31.9 ng/mL (ref 30.0–100.0)

## 2013-08-26 MED ORDER — ATORVASTATIN CALCIUM 40 MG PO TABS: 40.0000 mg | ORAL_TABLET | Freq: Every day | ORAL | Status: DC

## 2013-08-26 NOTE — Telephone Encounter (Signed)
lipitor rx sent to pharmacy 

## 2013-09-19 ENCOUNTER — Ambulatory Visit (INDEPENDENT_AMBULATORY_CARE_PROVIDER_SITE_OTHER): Payer: BC Managed Care – PPO

## 2013-09-19 ENCOUNTER — Ambulatory Visit (INDEPENDENT_AMBULATORY_CARE_PROVIDER_SITE_OTHER): Payer: BC Managed Care – PPO | Admitting: Pharmacist

## 2013-09-19 DIAGNOSIS — M949 Disorder of cartilage, unspecified: Secondary | ICD-10-CM

## 2013-09-19 DIAGNOSIS — M8580 Other specified disorders of bone density and structure, unspecified site: Secondary | ICD-10-CM

## 2013-09-19 DIAGNOSIS — M899 Disorder of bone, unspecified: Secondary | ICD-10-CM

## 2013-09-19 LAB — HM DEXA SCAN

## 2013-09-19 NOTE — Patient Instructions (Signed)

## 2013-09-19 NOTE — Progress Notes (Signed)
Patient ID: Lori Ross, female   DOB: 1951-03-04, 63 y.o.   MRN: [id number] [facility] Current Height:        Max Lifetime Height:  5' 5.5"  Current Weight:         Ethnicity:Caucasian  BP:       HR:         HPI: Does pt already have a diagnosis of:  Osteopenia?  Yes Osteoporosis?  No  Back Pain?  No       Kyphosis?  No Prior fracture?  No Med(s) for Osteoporosis/Osteopenia:  none Med(s) previously tried for Osteoporosis/Osteopenia:  none                                                             PMH: Age at menopause:  79's Hysterectomy?  No Oophorectomy?  No HRT? No Steroid Use?  No Thyroid med?  No History of cancer?  No History of digestive disorders (ie Crohn's)?  No Current or previous eating disorders?  No Last Vitamin D Result:  31.9 (08/24/13) Last GFR Result:  55 (08/24/2013)   FH/SH: Family history of osteoporosis?  Yes - mother Parent with history of hip fracture?  No Family history of breast cancer?  No Exercise?  No Smoking?  No Alcohol?  No    Calcium Assessment Calcium Intake  # of servings/day  Calcium mg  Milk (8 oz) 0.5  x  300  = 150mg   Yogurt (4 oz) 0 x  200 = 0  Cheese (1 oz) 1 x  200 = 200mg   Other Calcium sources   250mg   Ca supplement Calcium 500mg  qd = 500mg    Estimated calcium intake per day 1100mg     DEXA Results Date of Test T-Score for AP Spine L1-L4 T-Score for Total Left Hip T-Score for Total Right Hip  09/19/2013 -0.4 -1.0 -0.9  07/08/2011 -0.1 -1.1 -0.8  02/06/2009 0.1 -1.0 -0.8        FRAX 10 year estimate: Total FX risk:  11%  (consider medication if >/= 20%) Hip FX risk:  1.5%  (consider medication if >/= 3%)  Assessment: Osteopenia with stable BMD and low FRAX estimate  Recommendations: 1.  Discussed DEXA / BMD results and fracture risk 2.  recommend calcium 1200mg  daily through supplementation or diet.  3.  recommend weight bearing exercise - 30 minutes at least 4 days per week.   4.  Counseled and  educated about fall risk and prevention.  Recheck DEXA:  2 years  Time spent counseling patient:  20 minutes  Cherre Robins, PharmD, CPP

## 2013-09-26 ENCOUNTER — Ambulatory Visit (INDEPENDENT_AMBULATORY_CARE_PROVIDER_SITE_OTHER): Payer: BC Managed Care – PPO | Admitting: Family

## 2013-09-26 VITALS — BP 133/85 | HR 80 | Temp 99.3°F | Ht 64.0 in | Wt 158.0 lb

## 2013-09-26 DIAGNOSIS — L259 Unspecified contact dermatitis, unspecified cause: Secondary | ICD-10-CM

## 2013-09-26 MED ORDER — HYDROCORTISONE 1 % EX LOTN: 1.0000 "application " | TOPICAL_LOTION | Freq: Two times a day (BID) | CUTANEOUS | Status: DC

## 2013-09-26 MED ORDER — TRIAMCINOLONE ACETONIDE 0.5 % EX OINT: 1.0000 "application " | TOPICAL_OINTMENT | Freq: Two times a day (BID) | CUTANEOUS | Status: DC

## 2013-09-26 MED ORDER — METHYLPREDNISOLONE (PAK) 4 MG PO TABS: ORAL_TABLET | ORAL | Status: DC

## 2013-09-26 NOTE — Progress Notes (Signed)
   Subjective:    Patient ID: Lori Ross, female    DOB: 01-20-1951, 63 y.o.   MRN: [id number]  Rash This is a new problem. The current episode started 1 to 4 weeks ago (10 days ago). The problem has been gradually worsening since onset. The affected locations include the abdomen, left arm, right buttock, right upper leg, left upper leg, back, torso and chest. The rash is characterized by burning, itchiness and redness. Pertinent negatives include no congestion, cough, diarrhea, fever, joint pain, rhinorrhea, shortness of breath or sore throat. Past treatments include anti-itch cream, antihistamine and topical steroids. The treatment provided mild relief.      Review of Systems  Constitutional: Negative.  Negative for fever.  HENT: Negative.  Negative for congestion, rhinorrhea and sore throat.   Eyes: Negative.   Respiratory: Negative.  Negative for cough and shortness of breath.   Cardiovascular: Negative.  Negative for palpitations.  Gastrointestinal: Negative.  Negative for diarrhea.  Endocrine: Negative.   Genitourinary: Negative.   Musculoskeletal: Negative.  Negative for joint pain.  Skin: Positive for rash.  Neurological: Negative.  Negative for headaches.  Hematological: Negative.   Psychiatric/Behavioral: Negative.   All other systems reviewed and are negative.      Objective:   Physical Exam  Vitals reviewed. Constitutional: She is oriented to person, place, and time. She appears well-developed and well-nourished. No distress.  Cardiovascular: Normal rate, regular rhythm, normal heart sounds and intact distal pulses.   No murmur heard. Pulmonary/Chest: Effort normal and breath sounds normal. No respiratory distress. She has no wheezes.  Abdominal: Soft. Bowel sounds are normal. She exhibits no distension. There is no tenderness.  Musculoskeletal: Normal range of motion. She exhibits no edema and no tenderness.  Neurological: She is alert and oriented to person,  place, and time. She has normal reflexes. No cranial nerve deficit.  Skin: Skin is warm and dry. Rash noted. There is erythema.  Generalized rash on abdomen, chest, upper legs, upper arms, and back  Psychiatric: She has a normal mood and affect. Her behavior is normal. Judgment and thought content normal.    BP 133/85  Pulse 80  Temp(Src) 99.3 F (37.4 C) (Oral)  Ht 5\' 4"  (1.626 m)  Wt 158 lb (71.668 kg)  BMI 27.11 kg/m2       Assessment & Plan:  1. Contact dermatitis -Do not scratch -Good hand hygiene  -Benadryl prn - methylPREDNIsolone (MEDROL DOSPACK) 4 MG tablet; follow package directions  Dispense: 21 tablet; Refill: 0 - triamcinolone ointment (KENALOG) 0.5 %; Apply 1 application topically 2 (two) times daily.  Dispense: 30 g; Refill: 0 - hydrocortisone 1 % lotion; Apply 1 application topically 2 (two) times daily.  Dispense: 118 mL; Refill: 0  Evelina Dun, FNP

## 2013-09-26 NOTE — Patient Instructions (Signed)

## 2013-10-09 ENCOUNTER — Encounter: Payer: Self-pay | Admitting: Nurse Practitioner

## 2013-10-09 ENCOUNTER — Ambulatory Visit (INDEPENDENT_AMBULATORY_CARE_PROVIDER_SITE_OTHER): Payer: BC Managed Care – PPO | Admitting: Nurse Practitioner

## 2013-10-09 VITALS — BP 127/79 | HR 87 | Temp 98.5°F | Ht 64.0 in | Wt 157.0 lb

## 2013-10-09 DIAGNOSIS — W57XXXA Bitten or stung by nonvenomous insect and other nonvenomous arthropods, initial encounter: Secondary | ICD-10-CM

## 2013-10-09 DIAGNOSIS — T148 Other injury of unspecified body region: Secondary | ICD-10-CM

## 2013-10-09 MED ORDER — HYDROXYZINE HCL 25 MG PO TABS: 25.0000 mg | ORAL_TABLET | Freq: Three times a day (TID) | ORAL | Status: DC | PRN

## 2013-10-09 MED ORDER — METHYLPREDNISOLONE ACETATE 80 MG/ML IJ SUSP
80.0000 mg | Freq: Once | INTRAMUSCULAR | Status: AC
Start: 2013-10-09 — End: 2013-10-09
  Administered 2013-10-09: 80 mg via INTRAMUSCULAR

## 2013-10-09 NOTE — Progress Notes (Signed)
   Subjective:    Patient ID: AISLING EMIGH, female    DOB: 01-24-1951, 63 y.o.   MRN: [id number]  HPI Patient in today c/o rash that started over a  Month ago after picking blackberrys- never has completely gone away- main area is around waist line. Very itchy. No new detergents or soaps    Review of Systems  All other systems reviewed and are negative.      Objective:   Physical Exam  Constitutional: She is oriented to person, place, and time. She appears well-developed and well-nourished.  Cardiovascular: Normal rate, regular rhythm and normal heart sounds.   Pulmonary/Chest: Effort normal and breath sounds normal.  Neurological: She is alert and oriented to person, place, and time.  Skin: Skin is warm and dry.  Psychiatric: She has a normal mood and affect. Her behavior is normal. Judgment and thought content normal.    BP 127/79  Pulse 87  Temp(Src) 98.5 F (36.9 C) (Oral)  Ht 5\' 4"  (1.626 m)  Wt 157 lb (71.215 kg)  BMI 26.94 kg/m2       Assessment & Plan:   1. Bug bites    Meds ordered this encounter  Medications  . methylPREDNISolone acetate (DEPO-MEDROL) injection 80 mg    Sig:   . hydrOXYzine (ATARAX/VISTARIL) 25 MG tablet    Sig: Take 1 tablet (25 mg total) by mouth 3 (three) times daily as needed.    Dispense:  30 tablet    Refill:  0    Order Specific Question:  Supervising Provider    Answer:  Joycelyn Man    Avoid scratching Oatmeal bath Food diary to see if eating or drinking something that is making it worse RTO prn  [name], FNP

## 2013-10-09 NOTE — Patient Instructions (Signed)

## 2013-11-29 ENCOUNTER — Encounter: Payer: Self-pay | Admitting: Nurse Practitioner

## 2013-11-29 ENCOUNTER — Ambulatory Visit (INDEPENDENT_AMBULATORY_CARE_PROVIDER_SITE_OTHER): Payer: BC Managed Care – PPO | Admitting: Nurse Practitioner

## 2013-11-29 VITALS — BP 131/86 | HR 77 | Temp 99.1°F | Ht 64.0 in | Wt 157.2 lb

## 2013-11-29 DIAGNOSIS — M949 Disorder of cartilage, unspecified: Secondary | ICD-10-CM

## 2013-11-29 DIAGNOSIS — E785 Hyperlipidemia, unspecified: Secondary | ICD-10-CM

## 2013-11-29 DIAGNOSIS — M8580 Other specified disorders of bone density and structure, unspecified site: Secondary | ICD-10-CM

## 2013-11-29 DIAGNOSIS — M899 Disorder of bone, unspecified: Secondary | ICD-10-CM

## 2013-11-29 DIAGNOSIS — E559 Vitamin D deficiency, unspecified: Secondary | ICD-10-CM

## 2013-11-29 NOTE — Progress Notes (Signed)
  Subjective:    Patient ID: Lori Ross, female    DOB: 1950/04/07, 63 y.o.   MRN: [id number]  Patient here today for follow up of chronic medical problems. No complaints today.    Hyperlipidemia This is a chronic problem. The current episode started more than 1 year ago. The problem is uncontrolled. Recent lipid tests were reviewed and are high. She has no history of diabetes. There are no known factors aggravating her hyperlipidemia. Associated symptoms include leg pain and myalgias. Pertinent negatives include no chest pain, focal sensory loss, focal weakness or shortness of breath. Current antihyperlipidemic treatment includes ezetimibe, fibric acid derivatives and statins. The current treatment provides moderate improvement of lipids. Compliance problems include adherence to diet and medication side effects.  Risk factors for coronary artery disease include post-menopausal and dyslipidemia.   Vit D deficiency Vitamin D with calcium OTC. No c/o of fatigue.   *patient has an appointment with her GI doctor next month. She has colonoscopy schedule.    Review of Systems  Constitutional: Negative.   Respiratory: Negative for shortness of breath.   Cardiovascular: Negative for chest pain, palpitations and leg swelling.  Gastrointestinal: Negative.   Genitourinary: Negative.   Musculoskeletal: Positive for myalgias.  Neurological: Negative.  Negative for focal weakness.  Psychiatric/Behavioral: Negative.  Negative for sleep disturbance.  All other systems reviewed and are negative.      Objective:   Physical Exam  Constitutional: She is oriented to person, place, and time. She appears well-developed and well-nourished.  HENT:  Head: Normocephalic.  Right Ear: Hearing, tympanic membrane, external ear and ear canal normal.  Left Ear: Hearing, tympanic membrane, external ear and ear canal normal.  Mouth/Throat: Uvula is midline, oropharynx is clear and moist and mucous membranes are  normal.  Eyes: Pupils are equal, round, and reactive to light.  Neck: Trachea normal, normal range of motion and full passive range of motion without pain. Neck supple. No JVD present. Carotid bruit is not present. No thyromegaly present.  Cardiovascular: Normal rate, regular rhythm, normal heart sounds and intact distal pulses.  Exam reveals no gallop and no friction rub.   No murmur heard. Pulmonary/Chest: Effort normal and breath sounds normal. She has no wheezes. She has no rales.  Abdominal: Soft. Bowel sounds are normal. She exhibits no distension and no mass. There is no tenderness.  Musculoskeletal: Normal range of motion.  Lymphadenopathy:    She has no cervical adenopathy.  Neurological: She is alert and oriented to person, place, and time.  Skin: Skin is warm and dry.  Psychiatric: She has a normal mood and affect. Her behavior is normal. Judgment and thought content normal.   BP 131/86  Pulse 77  Temp(Src) 99.1 F (37.3 C) (Oral)  Ht _0  (1.626 m)  Wt 157 lb 3.2 oz (71.305 kg)  BMI 26.97 kg/m2       Assessment & Plan:  1. Hyperlipidemia -Pt taking lipitor 33m daily - CMP14+EGFR - NMR, lipoprofile  2. Osteopenia Taking calcium supplement  3. Vitamin D deficiency Taking vit D supplement.    Pt will schedule mammogram Labs pending Health maintenance reviewed Diet and exercise encouraged Continue all meds Follow up  In 3 months PRN   Mary-Margaret MHassell Done FNP

## 2013-11-29 NOTE — Patient Instructions (Signed)

## 2013-11-30 LAB — NMR, LIPOPROFILE
Cholesterol: 143 mg/dL (ref 100–199)
HDL Cholesterol by NMR: 48 mg/dL (ref >39)
HDL Particle Number: 43.2 umol/L (ref >=30.5)
LDL Particle Number: 1084 nmol/L — ABNORMAL HIGH (ref <1000)
LDL Size: 20.2 nm (ref >20.5)
LDLC SERPL CALC-MCNC: 77 mg/dL (ref 0–99)
LP-IR Score: 72 — ABNORMAL HIGH (ref <=45)
SMALL LDL PARTICLE NUMBER: 599 nmol/L — AB (ref <=527)
Triglycerides by NMR: 92 mg/dL (ref 0–149)

## 2013-11-30 LAB — CMP14+EGFR
ALT: 24 IU/L (ref 0–32)
AST: 25 IU/L (ref 0–40)
Albumin/Globulin Ratio: 1.7 (ref 1.1–2.5)
Albumin: 4.5 g/dL (ref 3.6–4.8)
Alkaline Phosphatase: 35 IU/L — ABNORMAL LOW (ref 39–117)
BUN / CREAT RATIO: 24 (ref 11–26)
BUN: 22 mg/dL (ref 8–27)
CALCIUM: 9.9 mg/dL (ref 8.7–10.3)
CO2: 23 mmol/L (ref 18–29)
CREATININE: 0.92 mg/dL (ref 0.57–1.00)
Chloride: 104 mmol/L (ref 97–108)
GFR calc Af Amer: 77 mL/min/{1.73_m2} (ref >59)
GFR calc non Af Amer: 67 mL/min/{1.73_m2} (ref >59)
Globulin, Total: 2.7 g/dL (ref 1.5–4.5)
Glucose: 94 mg/dL (ref 65–99)
POTASSIUM: 5.2 mmol/L (ref 3.5–5.2)
SODIUM: 143 mmol/L (ref 134–144)
Total Bilirubin: 0.4 mg/dL (ref 0.0–1.2)
Total Protein: 7.2 g/dL (ref 6.0–8.5)

## 2013-12-12 ENCOUNTER — Ambulatory Visit (INDEPENDENT_AMBULATORY_CARE_PROVIDER_SITE_OTHER): Payer: BC Managed Care – PPO

## 2013-12-12 DIAGNOSIS — Z23 Encounter for immunization: Secondary | ICD-10-CM

## 2014-02-08 ENCOUNTER — Other Ambulatory Visit: Payer: Self-pay | Admitting: Obstetrics and Gynecology

## 2014-02-09 LAB — CYTOLOGY - PAP

## 2014-03-07 ENCOUNTER — Encounter: Payer: Self-pay | Admitting: Nurse Practitioner

## 2014-03-07 ENCOUNTER — Ambulatory Visit (INDEPENDENT_AMBULATORY_CARE_PROVIDER_SITE_OTHER): Payer: BC Managed Care – PPO | Admitting: Nurse Practitioner

## 2014-03-07 VITALS — BP 124/85 | HR 80 | Temp 97.3°F | Ht 64.0 in | Wt 157.0 lb

## 2014-03-07 DIAGNOSIS — M8580 Other specified disorders of bone density and structure, unspecified site: Secondary | ICD-10-CM

## 2014-03-07 DIAGNOSIS — E785 Hyperlipidemia, unspecified: Secondary | ICD-10-CM

## 2014-03-07 DIAGNOSIS — E559 Vitamin D deficiency, unspecified: Secondary | ICD-10-CM

## 2014-03-07 MED ORDER — FENOFIBRATE 160 MG PO TABS: 160.0000 mg | ORAL_TABLET | Freq: Every day | ORAL | Status: DC

## 2014-03-07 MED ORDER — ATORVASTATIN CALCIUM 40 MG PO TABS: 40.0000 mg | ORAL_TABLET | Freq: Every day | ORAL | Status: DC

## 2014-03-07 NOTE — Patient Instructions (Signed)

## 2014-03-07 NOTE — Progress Notes (Signed)
  Subjective:    Patient ID: Lori Ross, female    DOB: May 24, 1950, 63 y.o.   MRN: [id number]  Patient here today for follow up of chronic medical problems. No complaints today.    Hyperlipidemia This is a chronic problem. The current episode started more than 1 year ago. The problem is controlled. Recent lipid tests were reviewed and are normal. Associated symptoms include myalgias. Pertinent negatives include no chest pain or shortness of breath. Current antihyperlipidemic treatment includes statins. The current treatment provides moderate improvement of lipids. Compliance problems include adherence to diet and adherence to exercise.  Risk factors for coronary artery disease include dyslipidemia.   Vit D deficiency Vitamin D with calcium OTC. No c/o of fatigue.      Review of Systems  Constitutional: Negative.   Respiratory: Negative for shortness of breath.   Cardiovascular: Negative for chest pain, palpitations and leg swelling.  Gastrointestinal: Negative.   Genitourinary: Negative.   Musculoskeletal: Positive for myalgias.  Neurological: Negative.   Psychiatric/Behavioral: Negative.  Negative for sleep disturbance.  All other systems reviewed and are negative.      Objective:   Physical Exam  Constitutional: She is oriented to person, place, and time. She appears well-developed and well-nourished.  HENT:  Nose: Nose normal.  Mouth/Throat: Oropharynx is clear and moist.  Eyes: EOM are normal.  Neck: Trachea normal, normal range of motion and full passive range of motion without pain. Neck supple. No JVD present. Carotid bruit is not present. No thyromegaly present.  Cardiovascular: Normal rate, regular rhythm, normal heart sounds and intact distal pulses.  Exam reveals no gallop and no friction rub.   No murmur heard. Pulmonary/Chest: Effort normal and breath sounds normal.  Abdominal: Soft. Bowel sounds are normal. She exhibits no distension and no mass. There is no  tenderness.  Musculoskeletal: Normal range of motion.  Lymphadenopathy:    She has no cervical adenopathy.  Neurological: She is alert and oriented to person, place, and time. She has normal reflexes.  Skin: Skin is warm and dry.  Psychiatric: She has a normal mood and affect. Her behavior is normal. Judgment and thought content normal.   BP 124/85 mmHg  Pulse 80  Temp(Src) 97.3 F (36.3 C) (Oral)  Ht $R'5\' 4"'JB$  (1.626 m)  Wt 157 lb (71.215 kg)  BMI 26.94 kg/m2       Assessment & Plan:    1. Hyperlipidemia Low fat diet - fenofibrate 160 MG tablet; Take 1 tablet (160 mg total) by mouth daily.  Dispense: 30 tablet; Refill: 5 - atorvastatin (LIPITOR) 40 MG tablet; Take 1 tablet (40 mg total) by mouth daily.  Dispense: 30 tablet; Refill: 5 - CMP14+EGFR - NMR, lipoprofile  2. Osteopenia Weight bearing exercises  3. Vitamin D deficiency   Labs pending Health maintenance reviewed Diet and exercise encouraged Continue all meds Follow up  In 6 months   [place], FNP

## 2014-03-08 LAB — CMP14+EGFR
ALT: 19 IU/L (ref 0–32)
AST: 19 IU/L (ref 0–40)
Albumin/Globulin Ratio: 1.5 (ref 1.1–2.5)
Albumin: 4.4 g/dL (ref 3.6–4.8)
Alkaline Phosphatase: 36 IU/L — ABNORMAL LOW (ref 39–117)
BUN/Creatinine Ratio: 22 (ref 11–26)
BUN: 20 mg/dL (ref 8–27)
CO2: 23 mmol/L (ref 18–29)
Calcium: 9.8 mg/dL (ref 8.7–10.3)
Chloride: 101 mmol/L (ref 97–108)
Creatinine, Ser: 0.89 mg/dL (ref 0.57–1.00)
GFR calc Af Amer: 80 mL/min/{1.73_m2} (ref >59)
GFR calc non Af Amer: 70 mL/min/{1.73_m2} (ref >59)
GLUCOSE: 87 mg/dL (ref 65–99)
Globulin, Total: 2.9 g/dL (ref 1.5–4.5)
Potassium: 4.8 mmol/L (ref 3.5–5.2)
SODIUM: 142 mmol/L (ref 134–144)
TOTAL PROTEIN: 7.3 g/dL (ref 6.0–8.5)
Total Bilirubin: 0.4 mg/dL (ref 0.0–1.2)

## 2014-03-08 LAB — NMR, LIPOPROFILE
Cholesterol: 147 mg/dL (ref 100–199)
HDL CHOLESTEROL BY NMR: 51 mg/dL (ref >39)
HDL Particle Number: 45.7 umol/L (ref >=30.5)
LDL Particle Number: 1129 nmol/L — ABNORMAL HIGH (ref <1000)
LDL Size: 20.5 nm (ref >20.5)
LDL-C: 75 mg/dL (ref 0–99)
LP-IR Score: 71 — ABNORMAL HIGH (ref <=45)
Small LDL Particle Number: 668 nmol/L — ABNORMAL HIGH (ref <=527)
TRIGLYCERIDES BY NMR: 107 mg/dL (ref 0–149)

## 2014-09-06 ENCOUNTER — Ambulatory Visit (INDEPENDENT_AMBULATORY_CARE_PROVIDER_SITE_OTHER): Payer: BLUE CROSS/BLUE SHIELD | Admitting: Nurse Practitioner

## 2014-09-06 ENCOUNTER — Encounter: Payer: Self-pay | Admitting: Nurse Practitioner

## 2014-09-06 ENCOUNTER — Ambulatory Visit (INDEPENDENT_AMBULATORY_CARE_PROVIDER_SITE_OTHER): Payer: BLUE CROSS/BLUE SHIELD

## 2014-09-06 VITALS — BP 128/86 | HR 71 | Temp 99.0°F | Ht 64.0 in | Wt 156.0 lb

## 2014-09-06 DIAGNOSIS — E559 Vitamin D deficiency, unspecified: Secondary | ICD-10-CM

## 2014-09-06 DIAGNOSIS — R609 Edema, unspecified: Secondary | ICD-10-CM

## 2014-09-06 DIAGNOSIS — M8580 Other specified disorders of bone density and structure, unspecified site: Secondary | ICD-10-CM | POA: Diagnosis not present

## 2014-09-06 DIAGNOSIS — E785 Hyperlipidemia, unspecified: Secondary | ICD-10-CM

## 2014-09-06 DIAGNOSIS — R600 Localized edema: Secondary | ICD-10-CM

## 2014-09-06 MED ORDER — ATORVASTATIN CALCIUM 40 MG PO TABS: 40.0000 mg | ORAL_TABLET | Freq: Every day | ORAL | Status: DC

## 2014-09-06 MED ORDER — FENOFIBRATE 160 MG PO TABS: 160.0000 mg | ORAL_TABLET | Freq: Every day | ORAL | Status: DC

## 2014-09-06 NOTE — Patient Instructions (Signed)
Exercise to Stay Healthy Exercise helps you become and stay healthy. EXERCISE IDEAS AND TIPS Choose exercises that:  You enjoy.  Fit into your day. You do not need to exercise really hard to be healthy. You can do exercises at a slow or medium level and stay healthy. You can:  Stretch before and after working out.  Try yoga, Pilates, or tai chi.  Lift weights.  Walk fast, swim, jog, run, climb stairs, bicycle, dance, or rollerskate.  Take aerobic classes. Exercises that burn about 150 calories:  Running 1  miles in 15 minutes.  Playing volleyball for 45 to 60 minutes.  Washing and waxing a car for 45 to 60 minutes.  Playing touch football for 45 minutes.  Walking 1  miles in 35 minutes.  Pushing a stroller 1  miles in 30 minutes.  Playing basketball for 30 minutes.  Raking leaves for 30 minutes.  Bicycling 5 miles in 30 minutes.  Walking 2 miles in 30 minutes.  Dancing for 30 minutes.  Shoveling snow for 15 minutes.  Swimming laps for 20 minutes.  Walking up stairs for 15 minutes.  Bicycling 4 miles in 15 minutes.  Gardening for 30 to 45 minutes.  Jumping rope for 15 minutes.  Washing windows or floors for 45 to 60 minutes. Document Released: 03/28/2010 Document Revised: 05/18/2011 Document Reviewed: 03/28/2010 [employer] Patient Information 2015 [employer]. This information is not intended to replace advice given to you by your health care provider. Make sure you discuss any questions you have with your health care provider.  

## 2014-09-06 NOTE — Progress Notes (Signed)
  Subjective:    Patient ID: Lori Ross, female    DOB: 09-Oct-1950, 64 y.o.   MRN: [id number]  Patient here today for follow up of chronic medical problems. No complaints today.    Hyperlipidemia This is a chronic problem. The current episode started more than 1 year ago. The problem is controlled. Recent lipid tests were reviewed and are normal. Associated symptoms include myalgias. Pertinent negatives include no chest pain or shortness of breath. Current antihyperlipidemic treatment includes statins. The current treatment provides moderate improvement of lipids. Compliance problems include adherence to diet and adherence to exercise.  Risk factors for coronary artery disease include dyslipidemia.  Vit D deficiency Vitamin D with calcium OTC. No c/o of fatigue.  osteopenia Weight bearing exercises 2-3 x a week   Review of Systems  Constitutional: Negative.   Respiratory: Negative for shortness of breath.   Cardiovascular: Negative for chest pain, palpitations and leg swelling.  Gastrointestinal: Negative.   Genitourinary: Negative.   Musculoskeletal: Positive for myalgias.  Neurological: Negative.   Psychiatric/Behavioral: Negative.  Negative for sleep disturbance.  All other systems reviewed and are negative.      Objective:   Physical Exam  Constitutional: She is oriented to person, place, and time. She appears well-developed and well-nourished.  HENT:  Nose: Nose normal.  Mouth/Throat: Oropharynx is clear and moist.  Eyes: EOM are normal.  Neck: Trachea normal, normal range of motion and full passive range of motion without pain. Neck supple. No JVD present. Carotid bruit is not present. No thyromegaly present.  Cardiovascular: Normal rate, regular rhythm, normal heart sounds and intact distal pulses.  Exam reveals no gallop and no friction rub.   No murmur heard. Pulmonary/Chest: Effort normal and breath sounds normal.  Abdominal: Soft. Bowel sounds are normal. She  exhibits no distension and no mass. There is no tenderness.  Musculoskeletal: Normal range of motion.  Lymphadenopathy:    She has no cervical adenopathy.  Neurological: She is alert and oriented to person, place, and time. She has normal reflexes.  Skin: Skin is warm and dry.  Psychiatric: She has a normal mood and affect. Her behavior is normal. Judgment and thought content normal.   BP 128/86 mmHg  Pulse 71  Temp(Src) 99 F (37.2 C) (Oral)  Ht $R'5\' 4"'kG$  (1.626 m)  Wt 156 lb (70.761 kg)  BMI 26.76 kg/m2  Chest xray- no acute or chronic findings-Preliminary reading by Ronnald Collum, FNP  [facility] Adella Nissen, FNP      Assessment & Plan:   1. Osteopenia Weight bearing exercises  2. Vitamin D deficiency  3. Hyperlipidemia Low fta diet - CMP14+EGFR - Lipid panel - DG Chest 2 View; Future - EKG 12-Lead - fenofibrate 160 MG tablet; Take 1 tablet (160 mg total) by mouth daily.  Dispense: 30 tablet; Refill: 5 - atorvastatin (LIPITOR) 40 MG tablet; Take 1 tablet (40 mg total) by mouth daily.  Dispense: 30 tablet; Refill: 5  4. Peripheral edema ekevate legs when sitting    Labs pending Health maintenance reviewed Diet and exercise encouraged Continue all meds Follow up  In 6 months   [place], FNP

## 2014-09-07 LAB — CMP14+EGFR
A/G RATIO: 1.7 (ref 1.1–2.5)
ALT: 26 IU/L (ref 0–32)
AST: 27 IU/L (ref 0–40)
Albumin: 4.4 g/dL (ref 3.6–4.8)
Alkaline Phosphatase: 41 IU/L (ref 39–117)
BUN/Creatinine Ratio: 24 (ref 11–26)
BUN: 20 mg/dL (ref 8–27)
Bilirubin Total: 0.5 mg/dL (ref 0.0–1.2)
CHLORIDE: 103 mmol/L (ref 97–108)
CO2: 22 mmol/L (ref 18–29)
Calcium: 9.5 mg/dL (ref 8.7–10.3)
Creatinine, Ser: 0.83 mg/dL (ref 0.57–1.00)
GFR calc Af Amer: 87 mL/min/{1.73_m2} (ref >59)
GFR calc non Af Amer: 75 mL/min/{1.73_m2} (ref >59)
GLUCOSE: 90 mg/dL (ref 65–99)
Globulin, Total: 2.6 g/dL (ref 1.5–4.5)
Potassium: 4.7 mmol/L (ref 3.5–5.2)
Sodium: 142 mmol/L (ref 134–144)
TOTAL PROTEIN: 7 g/dL (ref 6.0–8.5)

## 2014-09-07 LAB — LIPID PANEL
CHOL/HDL RATIO: 3.3 ratio (ref 0.0–4.4)
Cholesterol, Total: 149 mg/dL (ref 100–199)
HDL: 45 mg/dL (ref >39)
LDL Calculated: 78 mg/dL (ref 0–99)
Triglycerides: 132 mg/dL (ref 0–149)
VLDL Cholesterol Cal: 26 mg/dL (ref 5–40)

## 2014-09-27 ENCOUNTER — Encounter: Payer: Self-pay | Admitting: *Deleted

## 2014-12-10 ENCOUNTER — Ambulatory Visit: Payer: BLUE CROSS/BLUE SHIELD | Admitting: Nurse Practitioner

## 2014-12-14 ENCOUNTER — Ambulatory Visit (INDEPENDENT_AMBULATORY_CARE_PROVIDER_SITE_OTHER): Payer: BLUE CROSS/BLUE SHIELD | Admitting: Nurse Practitioner

## 2014-12-14 ENCOUNTER — Encounter: Payer: Self-pay | Admitting: Nurse Practitioner

## 2014-12-14 VITALS — BP 137/88 | HR 71 | Temp 97.6°F | Ht 64.0 in | Wt 157.0 lb

## 2014-12-14 DIAGNOSIS — R609 Edema, unspecified: Secondary | ICD-10-CM

## 2014-12-14 DIAGNOSIS — M8580 Other specified disorders of bone density and structure, unspecified site: Secondary | ICD-10-CM | POA: Diagnosis not present

## 2014-12-14 DIAGNOSIS — E785 Hyperlipidemia, unspecified: Secondary | ICD-10-CM

## 2014-12-14 DIAGNOSIS — Z23 Encounter for immunization: Secondary | ICD-10-CM

## 2014-12-14 DIAGNOSIS — Z6826 Body mass index (BMI) 26.0-26.9, adult: Secondary | ICD-10-CM

## 2014-12-14 DIAGNOSIS — E559 Vitamin D deficiency, unspecified: Secondary | ICD-10-CM

## 2014-12-14 MED ORDER — ATORVASTATIN CALCIUM 40 MG PO TABS: 40.0000 mg | ORAL_TABLET | Freq: Every day | ORAL | Status: DC

## 2014-12-14 MED ORDER — FENOFIBRATE 160 MG PO TABS: 160.0000 mg | ORAL_TABLET | Freq: Every day | ORAL | Status: DC

## 2014-12-14 MED ORDER — FUROSEMIDE 20 MG PO TABS: ORAL_TABLET | ORAL | Status: DC

## 2014-12-14 NOTE — Progress Notes (Signed)
  Subjective:    Patient ID: Lori Ross, female    DOB: 1950/07/30, 64 y.o.   MRN: [id number]  Patient here today for follow up of chronic medical problems. No complaints today.    Hyperlipidemia This is a chronic problem. The current episode started more than 1 year ago. The problem is controlled. Recent lipid tests were reviewed and are normal. Associated symptoms include myalgias. Pertinent negatives include no chest pain or shortness of breath. Current antihyperlipidemic treatment includes statins. The current treatment provides moderate improvement of lipids. Compliance problems include adherence to diet and adherence to exercise.  Risk factors for coronary artery disease include dyslipidemia.  Vit D deficiency Vitamin D with calcium OTC. No c/o of fatigue.  osteopenia Weight bearing exercises 2-3 x a week Peripheral edema Takes lasix which helps    Review of Systems  Constitutional: Negative.   Respiratory: Negative for shortness of breath.   Cardiovascular: Negative for chest pain, palpitations and leg swelling.  Gastrointestinal: Negative.   Genitourinary: Negative.   Musculoskeletal: Positive for myalgias.  Neurological: Negative.   Psychiatric/Behavioral: Negative.  Negative for sleep disturbance.  All other systems reviewed and are negative.      Objective:   Physical Exam  Constitutional: She is oriented to person, place, and time. She appears well-developed and well-nourished.  HENT:  Nose: Nose normal.  Mouth/Throat: Oropharynx is clear and moist.  Eyes: EOM are normal.  Neck: Trachea normal, normal range of motion and full passive range of motion without pain. Neck supple. No JVD present. Carotid bruit is not present. No thyromegaly present.  Cardiovascular: Normal rate, regular rhythm, normal heart sounds and intact distal pulses.  Exam reveals no gallop and no friction rub.   No murmur heard. Pulmonary/Chest: Effort normal and breath sounds normal.   Abdominal: Soft. Bowel sounds are normal. She exhibits no distension and no mass. There is no tenderness.  Musculoskeletal: Normal range of motion.  Lymphadenopathy:    She has no cervical adenopathy.  Neurological: She is alert and oriented to person, place, and time. She has normal reflexes.  Skin: Skin is warm and dry.  Psychiatric: She has a normal mood and affect. Her behavior is normal. Judgment and thought content normal.   BP 137/88 mmHg  Pulse 71  Temp(Src) 97.6 F (36.4 C) (Oral)  Ht _0  (1.626 m)  Wt 157 lb (71.215 kg)  BMI 26.94 kg/m2       Assessment & Plan:  1. Osteopenia Weight bearing exercises  2. Hyperlipidemia Low fat diet - atorvastatin (LIPITOR) 40 MG tablet; Take 1 tablet (40 mg total) by mouth daily.  Dispense: 30 tablet; Refill: 5 - CMP14+EGFR - Lipid panel - fenofibrate 160 MG tablet; Take 1 tablet (160 mg total) by mouth daily.  Dispense: 30 tablet; Refill: 5  3. Vitamin D deficiency  4. Peripheral edema Elevate legs when sitting - furosemide (LASIX) 20 MG tablet; 1/2 - 1 po qd prn  Dispense: 30 tablet; Refill: 5  5. BMI 26.0-26.9,adult Discussed diet and exercise for person with BMI >25 Will recheck weight in 3-6 months     Labs pending Health maintenance reviewed Diet and exercise encouraged Continue all meds Follow up  In 3 months    [place], FNP

## 2014-12-14 NOTE — Patient Instructions (Signed)

## 2014-12-15 LAB — CMP14+EGFR
ALT: 17 IU/L (ref 0–32)
AST: 21 IU/L (ref 0–40)
Albumin/Globulin Ratio: 1.7 (ref 1.1–2.5)
Albumin: 4.4 g/dL (ref 3.6–4.8)
Alkaline Phosphatase: 34 IU/L — ABNORMAL LOW (ref 39–117)
BUN/Creatinine Ratio: 27 — ABNORMAL HIGH (ref 11–26)
BUN: 22 mg/dL (ref 8–27)
Bilirubin Total: 0.4 mg/dL (ref 0.0–1.2)
CO2: 23 mmol/L (ref 18–29)
Calcium: 9.7 mg/dL (ref 8.7–10.3)
Chloride: 104 mmol/L (ref 97–108)
Creatinine, Ser: 0.82 mg/dL (ref 0.57–1.00)
GFR calc Af Amer: 88 mL/min/{1.73_m2} (ref >59)
GFR, EST NON AFRICAN AMERICAN: 76 mL/min/{1.73_m2} (ref >59)
GLOBULIN, TOTAL: 2.6 g/dL (ref 1.5–4.5)
Glucose: 94 mg/dL (ref 65–99)
Potassium: 4.8 mmol/L (ref 3.5–5.2)
Sodium: 143 mmol/L (ref 134–144)
Total Protein: 7 g/dL (ref 6.0–8.5)

## 2014-12-15 LAB — LIPID PANEL
CHOL/HDL RATIO: 3.8 ratio (ref 0.0–4.4)
Cholesterol, Total: 155 mg/dL (ref 100–199)
HDL: 41 mg/dL (ref >39)
LDL CALC: 90 mg/dL (ref 0–99)
TRIGLYCERIDES: 118 mg/dL (ref 0–149)
VLDL CHOLESTEROL CAL: 24 mg/dL (ref 5–40)

## 2015-03-13 ENCOUNTER — Ambulatory Visit: Payer: BLUE CROSS/BLUE SHIELD | Admitting: Nurse Practitioner

## 2015-03-26 ENCOUNTER — Ambulatory Visit (INDEPENDENT_AMBULATORY_CARE_PROVIDER_SITE_OTHER): Payer: BLUE CROSS/BLUE SHIELD | Admitting: Nurse Practitioner

## 2015-03-26 ENCOUNTER — Encounter: Payer: Self-pay | Admitting: Nurse Practitioner

## 2015-03-26 VITALS — BP 125/91 | HR 78 | Temp 97.4°F | Ht 64.0 in | Wt 156.0 lb

## 2015-03-26 DIAGNOSIS — Z6826 Body mass index (BMI) 26.0-26.9, adult: Secondary | ICD-10-CM

## 2015-03-26 DIAGNOSIS — Z1212 Encounter for screening for malignant neoplasm of rectum: Secondary | ICD-10-CM | POA: Diagnosis not present

## 2015-03-26 DIAGNOSIS — Z1159 Encounter for screening for other viral diseases: Secondary | ICD-10-CM | POA: Diagnosis not present

## 2015-03-26 DIAGNOSIS — E559 Vitamin D deficiency, unspecified: Secondary | ICD-10-CM

## 2015-03-26 DIAGNOSIS — E785 Hyperlipidemia, unspecified: Secondary | ICD-10-CM | POA: Diagnosis not present

## 2015-03-26 DIAGNOSIS — M8580 Other specified disorders of bone density and structure, unspecified site: Secondary | ICD-10-CM

## 2015-03-26 DIAGNOSIS — R609 Edema, unspecified: Secondary | ICD-10-CM | POA: Diagnosis not present

## 2015-03-26 MED ORDER — ATORVASTATIN CALCIUM 40 MG PO TABS: 40.0000 mg | ORAL_TABLET | Freq: Every day | ORAL | Status: DC

## 2015-03-26 MED ORDER — FUROSEMIDE 20 MG PO TABS: ORAL_TABLET | ORAL | Status: DC

## 2015-03-26 MED ORDER — FENOFIBRATE 160 MG PO TABS: 160.0000 mg | ORAL_TABLET | Freq: Every day | ORAL | Status: DC

## 2015-03-26 NOTE — Patient Instructions (Signed)

## 2015-03-26 NOTE — Progress Notes (Signed)
Subjective:    Patient ID: Lori Ross, female    DOB: November 05, 1950, 65 y.o.   MRN: [id number]  Patient here today for follow up of chronic medical problems.  Outpatient Encounter Prescriptions as of 03/26/2015  Medication Sig  . atorvastatin (LIPITOR) 40 MG tablet Take 1 tablet (40 mg total) by mouth daily.  . calcium-vitamin D (OSCAL WITH D) 500-200 MG-UNIT per tablet Take 1 tablet by mouth daily.  . Cholecalciferol (VITAMIN D3) 2000 UNITS TABS Take 1 tablet by mouth daily.    Marland Kitchen co-enzyme Q-10 50 MG capsule Take 50 mg by mouth daily.  . fenofibrate 160 MG tablet Take 1 tablet (160 mg total) by mouth daily.  . fish oil-omega-3 fatty acids 1000 MG capsule Take 2 g by mouth daily.  . [DISCONTINUED] atorvastatin (LIPITOR) 40 MG tablet Take 1 tablet (40 mg total) by mouth daily.  . [DISCONTINUED] fenofibrate 160 MG tablet Take 1 tablet (160 mg total) by mouth daily.  . furosemide (LASIX) 20 MG tablet 1/2 - 1 po qd prn  . [DISCONTINUED] furosemide (LASIX) 20 MG tablet 1/2 - 1 po qd prn (Patient not taking: Reported on 03/26/2015)   No facility-administered encounter medications on file as of 03/26/2015.      Hyperlipidemia This is a chronic problem. The current episode started more than 1 year ago. The problem is controlled. Recent lipid tests were reviewed and are normal. Associated symptoms include myalgias. Pertinent negatives include no chest pain or shortness of breath. Current antihyperlipidemic treatment includes statins. The current treatment provides moderate improvement of lipids. Compliance problems include adherence to diet and adherence to exercise.  Risk factors for coronary artery disease include dyslipidemia.  Vit D deficiency Vitamin D with calcium OTC. No c/o of fatigue.  osteopenia Weight bearing exercises 2-3 x a week Peripheral edema Takes lasix which helps    Review of Systems  Constitutional: Negative.   Respiratory: Negative for shortness of breath.    Cardiovascular: Negative for chest pain, palpitations and leg swelling.  Gastrointestinal: Negative.   Genitourinary: Negative.   Musculoskeletal: Positive for myalgias.  Neurological: Negative.   Psychiatric/Behavioral: Negative.  Negative for sleep disturbance.  All other systems reviewed and are negative.      Objective:   Physical Exam  Constitutional: She is oriented to person, place, and time. She appears well-developed and well-nourished.  HENT:  Nose: Nose normal.  Mouth/Throat: Oropharynx is clear and moist.  Eyes: EOM are normal.  Neck: Trachea normal, normal range of motion and full passive range of motion without pain. Neck supple. No JVD present. Carotid bruit is not present. No thyromegaly present.  Cardiovascular: Normal rate, regular rhythm, normal heart sounds and intact distal pulses.  Exam reveals no gallop and no friction rub.   No murmur heard. Pulmonary/Chest: Effort normal and breath sounds normal.  Abdominal: Soft. Bowel sounds are normal. She exhibits no distension and no mass. There is no tenderness.  Musculoskeletal: Normal range of motion.  Lymphadenopathy:    She has no cervical adenopathy.  Neurological: She is alert and oriented to person, place, and time. She has normal reflexes.  Skin: Skin is warm and dry.  Psychiatric: She has a normal mood and affect. Her behavior is normal. Judgment and thought content normal.   BP 125/91 mmHg  Pulse 78  Temp(Src) 97.4 F (36.3 C) (Oral)  Ht 5' 4"  (1.626 m)  Wt 156 lb (70.761 kg)  BMI 26.76 kg/m2        Assessment &  Plan:  1. Osteopenia Weight bearing exercises  2. Hyperlipidemia Low fat diet - atorvastatin (LIPITOR) 40 MG tablet; Take 1 tablet (40 mg total) by mouth daily.  Dispense: 30 tablet; Refill: 5 - CMP14+EGFR - Lipid panel - fenofibrate 160 MG tablet; Take 1 tablet (160 mg total) by mouth daily.  Dispense: 30 tablet; Refill: 5  3. Vitamin D deficiency  4. Peripheral edema Elevate  legs when sitting - furosemide (LASIX) 20 MG tablet; 1/2 - 1 po qd prn  Dispense: 30 tablet; Refill: 5  5. BMI 26.0-26.9,adult Discussed diet and exercise for person with BMI >25 Will recheck weight in 3-6 months    Patient will schedule mammmogram Labs pending Health maintenance reviewed Diet and exercise encouraged Continue all meds Follow up  In 3 months    [place], FNP

## 2015-03-27 LAB — CMP14+EGFR
A/G RATIO: 1.8 (ref 1.1–2.5)
ALT: 19 IU/L (ref 0–32)
AST: 22 IU/L (ref 0–40)
Albumin: 4.5 g/dL (ref 3.6–4.8)
Alkaline Phosphatase: 39 IU/L (ref 39–117)
BUN/Creatinine Ratio: 26 (ref 11–26)
BUN: 20 mg/dL (ref 8–27)
Bilirubin Total: 0.3 mg/dL (ref 0.0–1.2)
CALCIUM: 9.8 mg/dL (ref 8.7–10.3)
CO2: 24 mmol/L (ref 18–29)
CREATININE: 0.78 mg/dL (ref 0.57–1.00)
Chloride: 103 mmol/L (ref 96–106)
GFR, EST AFRICAN AMERICAN: 93 mL/min/{1.73_m2} (ref >59)
GFR, EST NON AFRICAN AMERICAN: 81 mL/min/{1.73_m2} (ref >59)
Globulin, Total: 2.5 g/dL (ref 1.5–4.5)
Glucose: 94 mg/dL (ref 65–99)
POTASSIUM: 4.6 mmol/L (ref 3.5–5.2)
Sodium: 145 mmol/L — ABNORMAL HIGH (ref 134–144)
TOTAL PROTEIN: 7 g/dL (ref 6.0–8.5)

## 2015-03-27 LAB — LIPID PANEL
Chol/HDL Ratio: 2.7 ratio units (ref 0.0–4.4)
Cholesterol, Total: 126 mg/dL (ref 100–199)
HDL: 46 mg/dL (ref >39)
LDL CALC: 63 mg/dL (ref 0–99)
Triglycerides: 83 mg/dL (ref 0–149)
VLDL CHOLESTEROL CAL: 17 mg/dL (ref 5–40)

## 2015-03-27 LAB — HEPATITIS C ANTIBODY

## 2015-04-30 ENCOUNTER — Other Ambulatory Visit: Payer: BLUE CROSS/BLUE SHIELD

## 2015-04-30 DIAGNOSIS — Z1212 Encounter for screening for malignant neoplasm of rectum: Secondary | ICD-10-CM

## 2015-05-02 LAB — FECAL OCCULT BLOOD, IMMUNOCHEMICAL: FECAL OCCULT BLD: NEGATIVE

## 2015-07-17 ENCOUNTER — Other Ambulatory Visit: Payer: Self-pay | Admitting: Nurse Practitioner

## 2015-07-17 DIAGNOSIS — Z1231 Encounter for screening mammogram for malignant neoplasm of breast: Secondary | ICD-10-CM

## 2015-07-25 ENCOUNTER — Ambulatory Visit (HOSPITAL_COMMUNITY)
Admission: RE | Admit: 2015-07-25 | Discharge: 2015-07-25 | Disposition: A | Discharge status: Home / Self Care | Payer: BLUE CROSS/BLUE SHIELD | Source: Ambulatory Visit | Attending: Nurse Practitioner | Admitting: Nurse Practitioner

## 2015-07-25 DIAGNOSIS — Z1231 Encounter for screening mammogram for malignant neoplasm of breast: Secondary | ICD-10-CM

## 2015-09-25 ENCOUNTER — Encounter: Payer: Self-pay | Admitting: Nurse Practitioner

## 2015-09-25 ENCOUNTER — Ambulatory Visit (INDEPENDENT_AMBULATORY_CARE_PROVIDER_SITE_OTHER): Payer: BLUE CROSS/BLUE SHIELD | Admitting: Nurse Practitioner

## 2015-09-25 VITALS — BP 128/87 | HR 75 | Temp 97.3°F | Ht 64.0 in | Wt 161.0 lb

## 2015-09-25 DIAGNOSIS — Z13820 Encounter for screening for osteoporosis: Secondary | ICD-10-CM

## 2015-09-25 DIAGNOSIS — Z6826 Body mass index (BMI) 26.0-26.9, adult: Secondary | ICD-10-CM | POA: Diagnosis not present

## 2015-09-25 DIAGNOSIS — E785 Hyperlipidemia, unspecified: Secondary | ICD-10-CM

## 2015-09-25 DIAGNOSIS — M8580 Other specified disorders of bone density and structure, unspecified site: Secondary | ICD-10-CM | POA: Diagnosis not present

## 2015-09-25 DIAGNOSIS — R609 Edema, unspecified: Secondary | ICD-10-CM

## 2015-09-25 DIAGNOSIS — E559 Vitamin D deficiency, unspecified: Secondary | ICD-10-CM | POA: Diagnosis not present

## 2015-09-25 LAB — CMP14+EGFR
ALBUMIN: 4.5 g/dL (ref 3.6–4.8)
ALK PHOS: 39 IU/L (ref 39–117)
ALT: 26 IU/L (ref 0–32)
AST: 25 IU/L (ref 0–40)
Albumin/Globulin Ratio: 1.7 (ref 1.2–2.2)
BUN / CREAT RATIO: 24 (ref 12–28)
BUN: 22 mg/dL (ref 8–27)
Bilirubin Total: 0.4 mg/dL (ref 0.0–1.2)
CHLORIDE: 102 mmol/L (ref 96–106)
CO2: 25 mmol/L (ref 18–29)
CREATININE: 0.92 mg/dL (ref 0.57–1.00)
Calcium: 9.6 mg/dL (ref 8.7–10.3)
GFR calc non Af Amer: 66 mL/min/{1.73_m2} (ref >59)
GFR, EST AFRICAN AMERICAN: 76 mL/min/{1.73_m2} (ref >59)
GLUCOSE: 94 mg/dL (ref 65–99)
Globulin, Total: 2.7 g/dL (ref 1.5–4.5)
Potassium: 4.8 mmol/L (ref 3.5–5.2)
Sodium: 142 mmol/L (ref 134–144)
TOTAL PROTEIN: 7.2 g/dL (ref 6.0–8.5)

## 2015-09-25 LAB — LIPID PANEL
CHOLESTEROL TOTAL: 152 mg/dL (ref 100–199)
Chol/HDL Ratio: 3.5 ratio units (ref 0.0–4.4)
HDL: 44 mg/dL (ref >39)
LDL CALC: 81 mg/dL (ref 0–99)
Triglycerides: 137 mg/dL (ref 0–149)
VLDL CHOLESTEROL CAL: 27 mg/dL (ref 5–40)

## 2015-09-25 MED ORDER — ATORVASTATIN CALCIUM 40 MG PO TABS: 40.0000 mg | ORAL_TABLET | Freq: Every day | ORAL | Status: DC

## 2015-09-25 MED ORDER — FUROSEMIDE 20 MG PO TABS: ORAL_TABLET | ORAL | Status: DC

## 2015-09-25 MED ORDER — FENOFIBRATE 160 MG PO TABS: 160.0000 mg | ORAL_TABLET | Freq: Every day | ORAL | Status: DC

## 2015-09-25 NOTE — Patient Instructions (Signed)
Bone Health Bones protect organs, store calcium, and anchor muscles. Good health habits, such as eating nutritious foods and exercising regularly, are important for maintaining healthy bones. They can also help to prevent a condition that causes bones to lose density and become weak and brittle (osteoporosis). WHY IS BONE MASS IMPORTANT? Bone mass refers to the amount of bone tissue that you have. The higher your bone mass, the stronger your bones. An important step toward having healthy bones throughout life is to have strong and dense bones during childhood. A young adult who has a high bone mass is more likely to have a high bone mass later in life. Bone mass at its greatest it is called peak bone mass. A large decline in bone mass occurs in older adults. In women, it occurs about the time of menopause. During this time, it is important to practice good health habits, because if more bone is lost than what is replaced, the bones will become less healthy and more likely to break (fracture). If you find that you have a low bone mass, you may be able to prevent osteoporosis or further bone loss by changing your diet and lifestyle. HOW CAN I FIND OUT IF MY BONE MASS IS LOW? Bone mass can be measured with an X-ray test that is called a bone mineral density (BMD) test. This test is recommended for all women who are age 65 or older. It may also be recommended for men who are age 70 or older, or for people who are more likely to develop osteoporosis due to:  Having bones that break easily.  Having a long-term disease that weakens bones, such as kidney disease or rheumatoid arthritis.  Having menopause earlier than normal.  Taking medicine that weakens bones, such as steroids, thyroid hormones, or hormone treatment for breast cancer or prostate cancer.  Smoking.  Drinking three or more alcoholic drinks each day. WHAT ARE THE NUTRITIONAL RECOMMENDATIONS FOR HEALTHY BONES? To have healthy bones, you need  to get enough of the right minerals and vitamins. Most nutrition experts recommend getting these nutrients from the foods that you eat. Nutritional recommendations vary from person to person. Ask your health care provider what is healthy for you. Here are some general guidelines. Calcium Recommendations Calcium is the most important (essential) mineral for bone health. Most people can get enough calcium from their diet, but supplements may be recommended for people who are at risk for osteoporosis. Good sources of calcium include:  Dairy products, such as low-fat or nonfat milk, cheese, and yogurt.  Dark green leafy vegetables, such as bok choy and broccoli.  Calcium-fortified foods, such as orange juice, cereal, bread, soy beverages, and tofu products.  Nuts, such as almonds. Follow these recommended amounts for daily calcium intake:  Children, age 1-3: 700 mg.  Children, age 4-8: 1,000 mg.  Children, age 9-13: 1,300 mg.  Teens, age 14-18: 1,300 mg.  Adults, age 19-50: 1,000 mg.  Adults, age 51-70:  Men: 1,000 mg.  Women: 1,200 mg.  Adults, age 71 or older: 1,200 mg.  Pregnant and breastfeeding females:  Teens: 1,300 mg.  Adults: 1,000 mg. Vitamin D Recommendations Vitamin D is the most essential vitamin for bone health. It helps the body to absorb calcium. Sunlight stimulates the skin to make vitamin D, so be sure to get enough sunlight. If you live in a cold climate or you do not get outside often, your health care provider may recommend that you take vitamin D supplements. Good   sources of vitamin D in your diet include:  Egg yolks.  Saltwater fish.  Milk and cereal fortified with vitamin D. Follow these recommended amounts for daily vitamin D intake:  Children and teens, age 1-18: 600 international units.  Adults, age 50 or younger: 400-800 international units.  Adults, age 51 or older: 800-1,000 international units. Other Nutrients Other nutrients for bone  health include:  Phosphorus. This mineral is found in meat, poultry, dairy foods, nuts, and legumes. The recommended daily intake for adult men and adult women is 700 mg.  Magnesium. This mineral is found in seeds, nuts, dark green vegetables, and legumes. The recommended daily intake for adult men is 400-420 mg. For adult women, it is 310-320 mg.  Vitamin K. This vitamin is found in green leafy vegetables. The recommended daily intake is 120 mg for adult men and 90 mg for adult women. WHAT TYPE OF PHYSICAL ACTIVITY IS BEST FOR BUILDING AND MAINTAINING HEALTHY BONES? Weight-bearing and strength-building activities are important for building and maintaining peak bone mass. Weight-bearing activities cause muscles and bones to work against gravity. Strength-building activities increases muscle strength that supports bones. Weight-bearing and muscle-building activities include:  Walking and hiking.  Jogging and running.  Dancing.  Gym exercises.  Lifting weights.  Tennis and racquetball.  Climbing stairs.  Aerobics. Adults should get at least 30 minutes of moderate physical activity on most days. Children should get at least 60 minutes of moderate physical activity on most days. Ask your health care provide what type of exercise is best for you. WHERE CAN I FIND MORE INFORMATION? For more information, check out the following websites:  National Osteoporosis Foundation: http://nof.org/learn/basics  National Institutes of Health: http://www.niams.nih.gov/Health_Info/Bone/Bone_Health/bone_health_for_life.asp   This information is not intended to replace advice given to you by your health care provider. Make sure you discuss any questions you have with your health care provider.   Document Released: 05/16/2003 Document Revised: 07/10/2014 Document Reviewed: 02/28/2014 Elsevier Interactive Patient Education 2016 [employer]  

## 2015-09-25 NOTE — Progress Notes (Signed)
Subjective:    Patient ID: Lori Ross, female    DOB: 1950/04/07, 65 y.o.   MRN: [id number]  Patient here today for follow up of chronic medical problems.  Outpatient Encounter Prescriptions as of 09/25/2015  Medication Sig  . atorvastatin (LIPITOR) 40 MG tablet Take 1 tablet (40 mg total) by mouth daily.  . calcium-vitamin D (OSCAL WITH D) 500-200 MG-UNIT per tablet Take 1 tablet by mouth daily.  . Cholecalciferol (VITAMIN D3) 2000 UNITS TABS Take 1 tablet by mouth daily.    Marland Kitchen co-enzyme Q-10 50 MG capsule Take 50 mg by mouth daily.  . fenofibrate 160 MG tablet Take 1 tablet (160 mg total) by mouth daily.  . fish oil-omega-3 fatty acids 1000 MG capsule Take 2 g by mouth daily.  . furosemide (LASIX) 20 MG tablet 1/2 - 1 po qd prn   No facility-administered encounter medications on file as of 09/25/2015.      Hyperlipidemia This is a chronic problem. The current episode started more than 1 year ago. The problem is controlled. Recent lipid tests were reviewed and are normal. Pertinent negatives include no chest pain, myalgias or shortness of breath. Current antihyperlipidemic treatment includes statins. The current treatment provides moderate improvement of lipids. Compliance problems include adherence to diet and adherence to exercise.  Risk factors for coronary artery disease include dyslipidemia.  Vit D deficiency Vitamin D with calcium OTC. No c/o of fatigue.  osteopenia Weight bearing exercises 2-3 x a week Peripheral edema Takes lasix which helps    Review of Systems  Constitutional: Negative.   Respiratory: Negative for shortness of breath.   Cardiovascular: Negative for chest pain, palpitations and leg swelling.  Gastrointestinal: Negative.   Genitourinary: Negative.   Musculoskeletal: Negative for myalgias.  Neurological: Negative.   Psychiatric/Behavioral: Negative.  Negative for sleep disturbance.  All other systems reviewed and are negative.      Objective:   Physical Exam  Constitutional: She is oriented to person, place, and time. She appears well-developed and well-nourished.  HENT:  Nose: Nose normal.  Mouth/Throat: Oropharynx is clear and moist.  Eyes: EOM are normal.  Neck: Trachea normal, normal range of motion and full passive range of motion without pain. Neck supple. No JVD present. Carotid bruit is not present. No thyromegaly present.  Cardiovascular: Normal rate, regular rhythm, normal heart sounds and intact distal pulses.  Exam reveals no gallop and no friction rub.   No murmur heard. Pulmonary/Chest: Effort normal and breath sounds normal.  Abdominal: Soft. Bowel sounds are normal. She exhibits no distension and no mass. There is no tenderness.  Musculoskeletal: Normal range of motion.  Lymphadenopathy:    She has no cervical adenopathy.  Neurological: She is alert and oriented to person, place, and time. She has normal reflexes.  Skin: Skin is warm and dry.  Psychiatric: She has a normal mood and affect. Her behavior is normal. Judgment and thought content normal.   BP 128/87 mmHg  Pulse 75  Temp(Src) 97.3 F (36.3 C) (Oral)  Ht 5' 4"  (1.626 m)  Wt 161 lb (73.029 kg)  BMI 27.62 kg/m2        Assessment & Plan:  1. Hyperlipidemia Low fat diet - fenofibrate 160 MG tablet; Take 1 tablet (160 mg total) by mouth daily.  Dispense: 30 tablet; Refill: 5 - atorvastatin (LIPITOR) 40 MG tablet; Take 1 tablet (40 mg total) by mouth daily.  Dispense: 30 tablet; Refill: 5 - CMP14+EGFR - Lipid panel  2. Vitamin D  deficiency  3. BMI 26.0-26.9,adult Discussed diet and exercise for person with BMI >25 Will recheck weight in 3-6 months  4. Peripheral edema elevte legs when sitting - furosemide (LASIX) 20 MG tablet; 1/2 - 1 po qd prn  Dispense: 30 tablet; Refill: 5  5. Osteopenia Weight bearing exercises  6. Screening for osteoporosis - DG [facility] DEXA    Labs pending Health maintenance reviewed Diet and exercise  encouraged Continue all meds Follow up  In 6 month   [place], FNP

## 2015-10-16 ENCOUNTER — Ambulatory Visit (INDEPENDENT_AMBULATORY_CARE_PROVIDER_SITE_OTHER): Payer: BLUE CROSS/BLUE SHIELD

## 2015-10-16 DIAGNOSIS — Z780 Asymptomatic menopausal state: Secondary | ICD-10-CM

## 2015-11-18 ENCOUNTER — Encounter: Payer: Self-pay | Admitting: Nurse Practitioner

## 2016-03-30 ENCOUNTER — Ambulatory Visit (INDEPENDENT_AMBULATORY_CARE_PROVIDER_SITE_OTHER): Payer: Medicare Other | Admitting: Nurse Practitioner

## 2016-03-30 ENCOUNTER — Encounter: Payer: Self-pay | Admitting: Nurse Practitioner

## 2016-03-30 VITALS — BP 132/84 | HR 70 | Temp 97.2°F | Ht 64.0 in | Wt 154.0 lb

## 2016-03-30 DIAGNOSIS — M8580 Other specified disorders of bone density and structure, unspecified site: Secondary | ICD-10-CM

## 2016-03-30 DIAGNOSIS — E782 Mixed hyperlipidemia: Secondary | ICD-10-CM

## 2016-03-30 DIAGNOSIS — R609 Edema, unspecified: Secondary | ICD-10-CM

## 2016-03-30 DIAGNOSIS — Z23 Encounter for immunization: Secondary | ICD-10-CM

## 2016-03-30 DIAGNOSIS — E559 Vitamin D deficiency, unspecified: Secondary | ICD-10-CM | POA: Diagnosis not present

## 2016-03-30 DIAGNOSIS — Z6826 Body mass index (BMI) 26.0-26.9, adult: Secondary | ICD-10-CM

## 2016-03-30 MED ORDER — FENOFIBRATE 160 MG PO TABS: 160.0000 mg | ORAL_TABLET | Freq: Every day | ORAL | 5 refills | Status: DC

## 2016-03-30 MED ORDER — ATORVASTATIN CALCIUM 40 MG PO TABS: 40.0000 mg | ORAL_TABLET | Freq: Every day | ORAL | 5 refills | Status: DC

## 2016-03-30 MED ORDER — FUROSEMIDE 20 MG PO TABS: ORAL_TABLET | ORAL | 5 refills | Status: DC

## 2016-03-30 NOTE — Addendum Note (Signed)
Addended by: Chevis Pretty on: 03/30/2016 08:42 AM   Modules accepted: Orders

## 2016-03-30 NOTE — Patient Instructions (Signed)
Bone Health Introduction Bones protect organs, store calcium, and anchor muscles. Good health habits, such as eating nutritious foods and exercising regularly, are important for maintaining healthy bones. They can also help to prevent a condition that causes bones to lose density and become weak and brittle (osteoporosis). Why is bone mass important? Bone mass refers to the amount of bone tissue that you have. The higher your bone mass, the stronger your bones. An important step toward having healthy bones throughout life is to have strong and dense bones during childhood. A young adult who has a high bone mass is more likely to have a high bone mass later in life. Bone mass at its greatest it is called peak bone mass. A large decline in bone mass occurs in older adults. In women, it occurs about the time of menopause. During this time, it is important to practice good health habits, because if more bone is lost than what is replaced, the bones will become less healthy and more likely to break (fracture). If you find that you have a low bone mass, you may be able to prevent osteoporosis or further bone loss by changing your diet and lifestyle. How can I find out if my bone mass is low? Bone mass can be measured with an X-ray test that is called a bone mineral density (BMD) test. This test is recommended for all women who are age 65 or older. It may also be recommended for men who are age 70 or older, or for people who are more likely to develop osteoporosis due to:  Having bones that break easily.  Having a long-term disease that weakens bones, such as kidney disease or rheumatoid arthritis.  Having menopause earlier than normal.  Taking medicine that weakens bones, such as steroids, thyroid hormones, or hormone treatment for breast cancer or prostate cancer.  Smoking.  Drinking three or more alcoholic drinks each day. What are the nutritional recommendations for healthy bones? To have healthy  bones, you need to get enough of the right minerals and vitamins. Most nutrition experts recommend getting these nutrients from the foods that you eat. Nutritional recommendations vary from person to person. Ask your health care provider what is healthy for you. Here are some general guidelines. Calcium Recommendations  Calcium is the most important (essential) mineral for bone health. Most people can get enough calcium from their diet, but supplements may be recommended for people who are at risk for osteoporosis. Good sources of calcium include:  Dairy products, such as low-fat or nonfat milk, cheese, and yogurt.  Dark green leafy vegetables, such as bok choy and broccoli.  Calcium-fortified foods, such as orange juice, cereal, bread, soy beverages, and tofu products.  Nuts, such as almonds. Follow these recommended amounts for daily calcium intake:  Children, age 1?3: 700 mg.  Children, age 4?8: 1,000 mg.  Children, age 9?13: 1,300 mg.  Teens, age 14?18: 1,300 mg.  Adults, age 19?50: 1,000 mg.  Adults, age 51?70:  Men: 1,000 mg.  Women: 1,200 mg.  Adults, age 71 or older: 1,200 mg.  Pregnant and breastfeeding females:  Teens: 1,300 mg.  Adults: 1,000 mg. Vitamin D Recommendations  Vitamin D is the most essential vitamin for bone health. It helps the body to absorb calcium. Sunlight stimulates the skin to make vitamin D, so be sure to get enough sunlight. If you live in a cold climate or you do not get outside often, your health care provider may recommend that you take vitamin   D supplements. Good sources of vitamin D in your diet include:  Egg yolks.  Saltwater fish.  Milk and cereal fortified with vitamin D. Follow these recommended amounts for daily vitamin D intake:  Children and teens, age 1?18: 600 international units.  Adults, age 50 or younger: 400-800 international units.  Adults, age 51 or older: 800-1,000 international units. Other Nutrients  Other  nutrients for bone health include:  Phosphorus. This mineral is found in meat, poultry, dairy foods, nuts, and legumes. The recommended daily intake for adult men and adult women is 700 mg.  Magnesium. This mineral is found in seeds, nuts, dark green vegetables, and legumes. The recommended daily intake for adult men is 400?420 mg. For adult women, it is 310?320 mg.  Vitamin K. This vitamin is found in green leafy vegetables. The recommended daily intake is 120 mg for adult men and 90 mg for adult women. What type of physical activity is best for building and maintaining healthy bones? Weight-bearing and strength-building activities are important for building and maintaining peak bone mass. Weight-bearing activities cause muscles and bones to work against gravity. Strength-building activities increases muscle strength that supports bones. Weight-bearing and muscle-building activities include:  Walking and hiking.  Jogging and running.  Dancing.  Gym exercises.  Lifting weights.  Tennis and racquetball.  Climbing stairs.  Aerobics. Adults should get at least 30 minutes of moderate physical activity on most days. Children should get at least 60 minutes of moderate physical activity on most days. Ask your health care provide what type of exercise is best for you. Where can I find more information? For more information, check out the following websites:  National Osteoporosis Foundation: http://nof.org/learn/basics  National Institutes of Health: http://www.niams.nih.gov/Health_Info/Bone/Bone_Health/bone_health_for_life.asp This information is not intended to replace advice given to you by your health care provider. Make sure you discuss any questions you have with your health care provider. Document Released: 05/16/2003 Document Revised: 09/13/2015 Document Reviewed: 02/28/2014  2017 Elsevier  

## 2016-03-30 NOTE — Addendum Note (Signed)
Addended by: Rolena Infante on: 03/30/2016 09:18 AM   Modules accepted: Orders

## 2016-03-30 NOTE — Progress Notes (Addendum)
Subjective:    Patient ID: Lori Ross, female    DOB: 1950/04/03, 66 y.o.   MRN: [id number]  Patient here today for follow up of chronic medical problems. No changes since last visit.   Outpatient Encounter Prescriptions as of 03/30/2016  Medication Sig  . atorvastatin (LIPITOR) 40 MG tablet Take 1 tablet (40 mg total) by mouth daily.  . calcium-vitamin D (OSCAL WITH D) 500-200 MG-UNIT per tablet Take 1 tablet by mouth daily.  . Cholecalciferol (VITAMIN D3) 2000 UNITS TABS Take 1 tablet by mouth daily.    Marland Kitchen co-enzyme Q-10 50 MG capsule Take 50 mg by mouth daily.  . fenofibrate 160 MG tablet Take 1 tablet (160 mg total) by mouth daily.  . fish oil-omega-3 fatty acids 1000 MG capsule Take 2 g by mouth daily.  . furosemide (LASIX) 20 MG tablet 1/2 - 1 po qd prn   No facility-administered encounter medications on file as of 03/30/2016.       Hyperlipidemia  This is a chronic problem. The current episode started more than 1 year ago. The problem is controlled. Recent lipid tests were reviewed and are normal. Pertinent negatives include no chest pain, myalgias or shortness of breath. Current antihyperlipidemic treatment includes statins. The current treatment provides moderate improvement of lipids. Compliance problems include adherence to diet and adherence to exercise.  Risk factors for coronary artery disease include dyslipidemia.  Vit D deficiency Vitamin D with calcium OTC. No c/o of fatigue.  osteopenia Weight bearing exercises 2-3 x a week Peripheral edema Takes lasix which helps    Review of Systems  Constitutional: Negative.   Respiratory: Negative for shortness of breath.   Cardiovascular: Negative for chest pain, palpitations and leg swelling.  Gastrointestinal: Negative.   Genitourinary: Negative.   Musculoskeletal: Negative for myalgias.  Neurological: Negative.   Psychiatric/Behavioral: Negative.  Negative for sleep disturbance.  All other systems reviewed and are  negative.      Objective:   Physical Exam  Constitutional: She is oriented to person, place, and time. She appears well-developed and well-nourished.  HENT:  Nose: Nose normal.  Mouth/Throat: Oropharynx is clear and moist.  Eyes: EOM are normal.  Neck: Trachea normal, normal range of motion and full passive range of motion without pain. Neck supple. No JVD present. Carotid bruit is not present. No thyromegaly present.  Cardiovascular: Normal rate, regular rhythm, normal heart sounds and intact distal pulses.  Exam reveals no gallop and no friction rub.   No murmur heard. Pulmonary/Chest: Effort normal and breath sounds normal.  Abdominal: Soft. Bowel sounds are normal. She exhibits no distension and no mass. There is no tenderness.  Musculoskeletal: Normal range of motion.  Lymphadenopathy:    She has no cervical adenopathy.  Neurological: She is alert and oriented to person, place, and time. She has normal reflexes.  Skin: Skin is warm and dry.  Psychiatric: She has a normal mood and affect. Her behavior is normal. Judgment and thought content normal.   BP 132/84 (BP Location: Left Arm, Cuff Size: Normal)   Pulse 70   Temp 97.2 F (36.2 C) (Oral)   Ht _0  (1.626 m)   Wt 154 lb (69.9 kg)   BMI 26.43 kg/m          Assessment & Plan:  1. Osteopenia, unspecified location Weight bearing exercises  2. BMI 26.0-26.9,adult Discussed diet and exercise for person with BMI >25 Will recheck weight in 3-6 months  3. Mixed hyperlipidemia Low fta diet -  CMP14+EGFR - Lipid panel - atorvastatin (LIPITOR) 40 MG tablet; Take 1 tablet (40 mg total) by mouth daily.  Dispense: 30 tablet; Refill: 5 - fenofibrate 160 MG tablet; Take 1 tablet (160 mg total) by mouth daily.  Dispense: 30 tablet; Refill: 5  4. Peripheral edema Elevate legs when sitting - furosemide (LASIX) 20 MG tablet; 1/2 - 1 po qd prn  Dispense: 30 tablet; Refill: 5  5. Vitamin D deficiency - VITAMIN D 25 Hydroxy  (Vit-D Deficiency, Fractures)    Labs pending Health maintenance reviewed Diet and exercise encouraged Continue all meds Follow up  In 6 months   [place], FNP

## 2016-03-31 LAB — CMP14+EGFR
A/G RATIO: 1.4 (ref 1.2–2.2)
ALT: 28 IU/L (ref 0–32)
AST: 22 IU/L (ref 0–40)
Albumin: 4.2 g/dL (ref 3.6–4.8)
Alkaline Phosphatase: 47 IU/L (ref 39–117)
BILIRUBIN TOTAL: 0.4 mg/dL (ref 0.0–1.2)
BUN/Creatinine Ratio: 20 (ref 12–28)
BUN: 15 mg/dL (ref 8–27)
CALCIUM: 9.4 mg/dL (ref 8.7–10.3)
CHLORIDE: 102 mmol/L (ref 96–106)
CO2: 24 mmol/L (ref 18–29)
Creatinine, Ser: 0.76 mg/dL (ref 0.57–1.00)
GFR calc Af Amer: 95 mL/min/{1.73_m2} (ref >59)
GFR, EST NON AFRICAN AMERICAN: 83 mL/min/{1.73_m2} (ref >59)
GLOBULIN, TOTAL: 3 g/dL (ref 1.5–4.5)
Glucose: 95 mg/dL (ref 65–99)
POTASSIUM: 4.3 mmol/L (ref 3.5–5.2)
SODIUM: 142 mmol/L (ref 134–144)
Total Protein: 7.2 g/dL (ref 6.0–8.5)

## 2016-03-31 LAB — LIPID PANEL
CHOL/HDL RATIO: 4.5 ratio — AB (ref 0.0–4.4)
Cholesterol, Total: 177 mg/dL (ref 100–199)
HDL: 39 mg/dL — ABNORMAL LOW (ref >39)
LDL Calculated: 93 mg/dL (ref 0–99)
TRIGLYCERIDES: 227 mg/dL — AB (ref 0–149)
VLDL Cholesterol Cal: 45 mg/dL — ABNORMAL HIGH (ref 5–40)

## 2016-03-31 LAB — VITAMIN D 25 HYDROXY (VIT D DEFICIENCY, FRACTURES): VIT D 25 HYDROXY: 30.4 ng/mL (ref 30.0–100.0)

## 2016-06-16 ENCOUNTER — Other Ambulatory Visit: Payer: Self-pay | Admitting: Nurse Practitioner

## 2016-06-16 DIAGNOSIS — Z1231 Encounter for screening mammogram for malignant neoplasm of breast: Secondary | ICD-10-CM

## 2016-07-06 ENCOUNTER — Ambulatory Visit (INDEPENDENT_AMBULATORY_CARE_PROVIDER_SITE_OTHER): Payer: Medicare Other | Admitting: Nurse Practitioner

## 2016-07-06 ENCOUNTER — Encounter: Payer: Self-pay | Admitting: Nurse Practitioner

## 2016-07-06 VITALS — BP 118/83 | HR 68 | Temp 97.6°F | Ht 64.0 in | Wt 155.0 lb

## 2016-07-06 DIAGNOSIS — E559 Vitamin D deficiency, unspecified: Secondary | ICD-10-CM

## 2016-07-06 DIAGNOSIS — E782 Mixed hyperlipidemia: Secondary | ICD-10-CM

## 2016-07-06 DIAGNOSIS — Z6826 Body mass index (BMI) 26.0-26.9, adult: Secondary | ICD-10-CM

## 2016-07-06 DIAGNOSIS — M8580 Other specified disorders of bone density and structure, unspecified site: Secondary | ICD-10-CM | POA: Diagnosis not present

## 2016-07-06 DIAGNOSIS — R609 Edema, unspecified: Secondary | ICD-10-CM

## 2016-07-06 MED ORDER — FUROSEMIDE 20 MG PO TABS: ORAL_TABLET | ORAL | 5 refills | Status: DC

## 2016-07-06 MED ORDER — ATORVASTATIN CALCIUM 40 MG PO TABS: 40.0000 mg | ORAL_TABLET | Freq: Every day | ORAL | 5 refills | Status: DC

## 2016-07-06 MED ORDER — FENOFIBRATE 160 MG PO TABS: 160.0000 mg | ORAL_TABLET | Freq: Every day | ORAL | 5 refills | Status: DC

## 2016-07-06 NOTE — Patient Instructions (Signed)
Calcium; Vitamin D oral tablets What is this medicine? CALCIUM; VITAMIN D (KAL see um; VYE ta min D) is a vitamin supplement. It is used to prevent conditions of low calcium and vitamin D. This medicine may be used for other purposes; ask your health care provider or pharmacist if you have questions. COMMON BRAND NAME(S): Calcarb 600 with Vitamin D, Calcet Plus Vitamin D, Calcitrate + D, Calcium Citrate + D3 Maximum, Calcium Citrate Maximum with D, Caltrate, Caltrate 600+D, Citracal + D, Citracal MAXIMUM + D, Citracal Petites with Vitamin D, Citrus Calcium Plus D, OSCAL 500 + D, OSCAL Calcium + D3, OSCAL Extra D3, Osteo-Poretical, Oysco 500 + D, Oysco D, Oystercal-D Calcium What should I tell my health care provider before I take this medicine? They need to know if you have any of these conditions: -constipation -dehydration -heart disease -high level of calcium or vitamin D in the blood -high level of phosphate in the blood -kidney disease -kidney stones -liver disease -parathyroid disease -sarcoidosis -stomach ulcer or obstruction -an unusual or allergic reaction to calcium, vitamin D, tartrazine dye, other medicines, foods, dyes, or preservatives -pregnant or trying to get pregnant -breast-feeding How should I use this medicine? Take this medicine by mouth with a glass of water. Follow the directions on the label. Take with food or within 1 hour after a meal. Take your medicine at regular intervals. Do not take your medicine more often than directed. Talk to your pediatrician regarding the use of this medicine in children. While this medicine may be used in children for selected conditions, precautions do apply. Overdosage: If you think you have taken too much of this medicine contact a poison control center or emergency room at once. NOTE: This medicine is only for you. Do not share this medicine with others. What if I miss a dose? If you miss a dose, take it as soon as you can. If it  is almost time for your next dose, take only that dose. Do not take double or extra doses. What may interact with this medicine? Do not take this medicine with any of the following medications: -ammonium chloride -methenamine This medicine may also interact with the following medications: -antibiotics like ciprofloxacin, gatifloxacin, tetracycline -captopril -delavirdine -diuretics -gabapentin -iron supplements -medicines for fungal infections like ketoconazole and itraconazole -medicines for seizures like ethotoin and phenytoin -mineral oil -mycophenolate -other vitamins with calcium, vitamin D, or minerals -quinidine -rosuvastatin -sucralfate -thyroid medicine This list may not describe all possible interactions. Give your health care provider a list of all the medicines, herbs, non-prescription drugs, or dietary supplements you use. Also tell them if you smoke, drink alcohol, or use illegal drugs. Some items may interact with your medicine. What should I watch for while using this medicine? Taking this medicine is not a substitute for a well-balanced diet and exercise. Talk with your doctor or health care provider and follow a healthy lifestyle. Do not take this medicine with high-fiber foods, large amounts of alcohol, or drinks containing caffeine. Do not take this medicine within 2 hours of any other medicines. What side effects may I notice from receiving this medicine? Side effects that you should report to your doctor or health care professional as soon as possible: -allergic reactions like skin rash, itching or hives, swelling of the face, lips, or tongue -confusion -dry mouth -high blood pressure -increased hunger or thirst -increased urination -irregular heartbeat -metallic taste -muscle or bone pain -pain when urinating -seizure -unusually weak or tired -weight loss  Side effects that usually do not require medical attention (report to your doctor or health care  professional if they continue or are bothersome): -constipation -diarrhea -headache -loss of appetite -nausea, vomiting -stomach upset This list may not describe all possible side effects. Call your doctor for medical advice about side effects. You may report side effects to FDA at 1-800-FDA-1088. Where should I keep my medicine? Keep out of the reach of children. Store at room temperature between 15 and 30 degrees C (59 and 86 degrees F). Protect from light. Keep container tightly closed. Throw away any unused medicine after the expiration date. NOTE: This sheet is a summary. It may not cover all possible information. If you have questions about this medicine, talk to your doctor, pharmacist, or health care provider.  2018 Elsevier/Gold Standard (2007-06-08 17:56:23)  

## 2016-07-06 NOTE — Progress Notes (Signed)
Subjective:    Patient ID: Lori Ross, female    DOB: 22-Feb-1951, 66 y.o.   MRN: [id number]  HPI   Lori Ross is here today for follow up of chronic medical problem.  Outpatient Encounter Prescriptions as of 07/06/2016  Medication Sig  . atorvastatin (LIPITOR) 40 MG tablet Take 1 tablet (40 mg total) by mouth daily.  . calcium-vitamin D (OSCAL WITH D) 500-200 MG-UNIT per tablet Take 1 tablet by mouth daily.  . Cholecalciferol (VITAMIN D3) 2000 UNITS TABS Take 1 tablet by mouth daily.    Marland Kitchen co-enzyme Q-10 50 MG capsule Take 50 mg by mouth daily.  . fenofibrate 160 MG tablet Take 1 tablet (160 mg total) by mouth daily.  . fish oil-omega-3 fatty acids 1000 MG capsule Take 2 g by mouth daily.  . furosemide (LASIX) 20 MG tablet 1/2 - 1 po qd prn   No facility-administered encounter medications on file as of 07/06/2016.     1. Osteopenia, unspecified location   does weight bearing exercise 2-3 x a week- no c/o back pain  2. BMI 26.0-26.9,adult   no recent weight gain or weight loss  3. Mixed hyperlipidemia  Tries to watch diet at home  4. Peripheral edema  Only has on occasion- not daily  5. Vitamin D deficiency  No problems- takes Vitamin d OTC daily    New complaints: None today    Review of Systems  Constitutional: Negative for diaphoresis.  Eyes: Negative for pain.  Respiratory: Negative for shortness of breath.   Cardiovascular: Negative for chest pain, palpitations and leg swelling.  Gastrointestinal: Negative for abdominal pain.  Endocrine: Negative for polydipsia.  Skin: Negative for rash.  Neurological: Negative for dizziness, weakness and headaches.  Hematological: Does not bruise/bleed easily.       Objective:   Physical Exam  Constitutional: She is oriented to person, place, and time. She appears well-developed and well-nourished.  HENT:  Nose: Nose normal.  Mouth/Throat: Oropharynx is clear and moist.  Eyes: EOM are normal.  Neck: Trachea  normal, normal range of motion and full passive range of motion without pain. Neck supple. No JVD present. Carotid bruit is not present. No thyromegaly present.  Cardiovascular: Normal rate, regular rhythm, normal heart sounds and intact distal pulses.  Exam reveals no gallop and no friction rub.   No murmur heard. Pulmonary/Chest: Effort normal and breath sounds normal.  Abdominal: Soft. Bowel sounds are normal. She exhibits no distension and no mass. There is no tenderness.  Musculoskeletal: Normal range of motion.  Lymphadenopathy:    She has no cervical adenopathy.  Neurological: She is alert and oriented to person, place, and time. She has normal reflexes.  Skin: Skin is warm and dry.  Psychiatric: She has a normal mood and affect. Her behavior is normal. Judgment and thought content normal.   BP 118/83   Pulse 68   Temp 97.6 F (36.4 C) (Oral)   Ht 5' 4"  (1.626 m)   Wt 155 lb (70.3 kg)   BMI 26.61 kg/m         Assessment & Plan:  1. Osteopenia, unspecified location Weight bearing exercises  2. BMI 26.0-26.9,adult Discussed diet and exercise for person with BMI >25 Will recheck weight in 3-6 months  3. Mixed hyperlipidemia Low fat diet - atorvastatin (LIPITOR) 40 MG tablet; Take 1 tablet (40 mg total) by mouth daily.  Dispense: 30 tablet; Refill: 5 - fenofibrate 160 MG tablet; Take 1 tablet (160 mg  total) by mouth daily.  Dispense: 30 tablet; Refill: 5 - CMP14+EGFR - Lipid panel  4. Peripheral edema Elevate legs when occure - furosemide (LASIX) 20 MG tablet; 1/2 - 1 po qd prn  Dispense: 30 tablet; Refill: 5  5. Vitamin D deficiency - VITAMIN D 25 Hydroxy (Vit-D Deficiency, Fractures)    Labs pending Health maintenance reviewed Diet and exercise encouraged Continue all meds Follow up  In 6 months   [place], FNP

## 2016-07-07 LAB — CMP14+EGFR
ALBUMIN: 4.2 g/dL (ref 3.6–4.8)
ALT: 19 IU/L (ref 0–32)
AST: 25 IU/L (ref 0–40)
Albumin/Globulin Ratio: 1.4 (ref 1.2–2.2)
Alkaline Phosphatase: 38 IU/L — ABNORMAL LOW (ref 39–117)
BUN / CREAT RATIO: 23 (ref 12–28)
BUN: 20 mg/dL (ref 8–27)
Bilirubin Total: 0.4 mg/dL (ref 0.0–1.2)
CALCIUM: 9.6 mg/dL (ref 8.7–10.3)
CO2: 24 mmol/L (ref 18–29)
CREATININE: 0.86 mg/dL (ref 0.57–1.00)
Chloride: 101 mmol/L (ref 96–106)
GFR calc Af Amer: 82 mL/min/{1.73_m2} (ref >59)
GFR, EST NON AFRICAN AMERICAN: 71 mL/min/{1.73_m2} (ref >59)
GLOBULIN, TOTAL: 3 g/dL (ref 1.5–4.5)
GLUCOSE: 96 mg/dL (ref 65–99)
Potassium: 5.5 mmol/L — ABNORMAL HIGH (ref 3.5–5.2)
SODIUM: 140 mmol/L (ref 134–144)
Total Protein: 7.2 g/dL (ref 6.0–8.5)

## 2016-07-07 LAB — LIPID PANEL
CHOL/HDL RATIO: 3.8 ratio (ref 0.0–4.4)
Cholesterol, Total: 153 mg/dL (ref 100–199)
HDL: 40 mg/dL (ref >39)
LDL CALC: 89 mg/dL (ref 0–99)
TRIGLYCERIDES: 121 mg/dL (ref 0–149)
VLDL CHOLESTEROL CAL: 24 mg/dL (ref 5–40)

## 2016-07-07 LAB — VITAMIN D 25 HYDROXY (VIT D DEFICIENCY, FRACTURES): Vit D, 25-Hydroxy: 30.4 ng/mL (ref 30.0–100.0)

## 2016-07-09 ENCOUNTER — Telehealth: Payer: Self-pay | Admitting: Nurse Practitioner

## 2016-07-09 NOTE — Telephone Encounter (Signed)
Needed 90 day supply on rx's. Rx's were filled for 6 months just a 30 month at a time.Verbal given.

## 2016-07-09 NOTE — Telephone Encounter (Signed)
[email or web address]

## 2016-07-27 ENCOUNTER — Ambulatory Visit (HOSPITAL_COMMUNITY)
Admission: RE | Admit: 2016-07-27 | Discharge: 2016-07-27 | Disposition: A | Discharge status: Home / Self Care | Payer: Medicare Other | Source: Ambulatory Visit | Attending: Nurse Practitioner | Admitting: Nurse Practitioner

## 2016-07-27 DIAGNOSIS — R928 Other abnormal and inconclusive findings on diagnostic imaging of breast: Secondary | ICD-10-CM | POA: Insufficient documentation

## 2016-07-27 DIAGNOSIS — Z1231 Encounter for screening mammogram for malignant neoplasm of breast: Secondary | ICD-10-CM | POA: Diagnosis not present

## 2016-07-29 ENCOUNTER — Other Ambulatory Visit: Payer: Self-pay | Admitting: Nurse Practitioner

## 2016-07-29 DIAGNOSIS — R921 Mammographic calcification found on diagnostic imaging of breast: Secondary | ICD-10-CM

## 2016-07-29 DIAGNOSIS — N6489 Other specified disorders of breast: Secondary | ICD-10-CM

## 2016-08-04 ENCOUNTER — Ambulatory Visit (HOSPITAL_COMMUNITY)
Admission: RE | Admit: 2016-08-04 | Discharge: 2016-08-04 | Disposition: A | Discharge status: Home / Self Care | Payer: Medicare Other | Source: Ambulatory Visit | Attending: Nurse Practitioner | Admitting: Nurse Practitioner

## 2016-08-04 DIAGNOSIS — N6489 Other specified disorders of breast: Secondary | ICD-10-CM | POA: Insufficient documentation

## 2016-08-04 DIAGNOSIS — R921 Mammographic calcification found on diagnostic imaging of breast: Secondary | ICD-10-CM

## 2016-08-04 DIAGNOSIS — R928 Other abnormal and inconclusive findings on diagnostic imaging of breast: Secondary | ICD-10-CM | POA: Diagnosis not present

## 2016-08-05 ENCOUNTER — Other Ambulatory Visit: Payer: Self-pay | Admitting: Nurse Practitioner

## 2016-08-05 ENCOUNTER — Encounter: Payer: Self-pay | Admitting: *Deleted

## 2016-08-05 DIAGNOSIS — R928 Other abnormal and inconclusive findings on diagnostic imaging of breast: Secondary | ICD-10-CM

## 2016-08-07 ENCOUNTER — Ambulatory Visit
Admission: RE | Admit: 2016-08-07 | Discharge: 2016-08-07 | Disposition: A | Discharge status: Home / Self Care | Payer: Medicare Other | Source: Ambulatory Visit | Attending: Nurse Practitioner | Admitting: Nurse Practitioner

## 2016-08-07 DIAGNOSIS — N6311 Unspecified lump in the right breast, upper outer quadrant: Secondary | ICD-10-CM | POA: Diagnosis not present

## 2016-08-07 DIAGNOSIS — R928 Other abnormal and inconclusive findings on diagnostic imaging of breast: Secondary | ICD-10-CM

## 2016-08-07 DIAGNOSIS — N6323 Unspecified lump in the left breast, lower outer quadrant: Secondary | ICD-10-CM | POA: Diagnosis not present

## 2016-08-07 DIAGNOSIS — N6021 Fibroadenosis of right breast: Secondary | ICD-10-CM | POA: Diagnosis not present

## 2016-08-07 DIAGNOSIS — R921 Mammographic calcification found on diagnostic imaging of breast: Secondary | ICD-10-CM | POA: Diagnosis not present

## 2016-08-07 DIAGNOSIS — N6489 Other specified disorders of breast: Secondary | ICD-10-CM | POA: Diagnosis not present

## 2016-08-31 ENCOUNTER — Other Ambulatory Visit: Payer: Self-pay | Admitting: General Surgery

## 2016-08-31 DIAGNOSIS — R928 Other abnormal and inconclusive findings on diagnostic imaging of breast: Secondary | ICD-10-CM | POA: Diagnosis not present

## 2016-08-31 DIAGNOSIS — E785 Hyperlipidemia, unspecified: Secondary | ICD-10-CM | POA: Diagnosis not present

## 2016-09-04 ENCOUNTER — Other Ambulatory Visit: Payer: Self-pay | Admitting: General Surgery

## 2016-09-04 DIAGNOSIS — R928 Other abnormal and inconclusive findings on diagnostic imaging of breast: Secondary | ICD-10-CM

## 2016-09-16 ENCOUNTER — Encounter (HOSPITAL_BASED_OUTPATIENT_CLINIC_OR_DEPARTMENT_OTHER): Payer: Self-pay | Admitting: *Deleted

## 2016-09-18 NOTE — H&P (Signed)
[name] Location: [facility] Patient #: [id number] DOB: Sep 20, 1950 Widowed / Language: Cleophus Molt / Race: White Female       History of Present Illness  The patient is a 66 year old female who presents with a breast mass. This is a 65 year old female from [place]. She is referred to me by Dr. Autumn Patty at the [facility] for evaluation of complex sclerosing lesion right breast, upper, slightly outer right breast. Chevis Pretty, FNP at [facility] family practice is her primary care provider. Yasmin served as my Producer, television/film/video throughout the encounter.  She has no prior breast problems. Recent screening mammograms revealed an area of distortion in the right breast, upper slightly outer. That was biopsied include showed complex sclerosing lesion and usual ductal hyperplasia. She also had focal calcifications in the lower inner quadrant of the left breast. 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  She is generally healthy. Takes fluid pills occasionally. Hyperlipidemia. Family history is negative for breast or ovarian cancer Social history reveals she is a widow. Lives alone in [place]. 2 children. 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 Denies tobacco or alcohol.  We had a long tall. I explained complex sclerosing lesion and hyperplasia to her. I told her there was a 4-9% chance that this will be upgraded to a malignancy on excisional biopsy. She would like to go ahead and have this area excised. She'll be scheduled for right breast lumpectomy with radioactive seed localization. I discussed the indications, details, techniques, numerous risk of the surgery with her. She is aware of the risk of bleeding, infection, cosmetic deformity, nerve damage with chronic pain or numbness, reoperation if cancer. She understands all these issues. All her questions are answered. She agrees with this plan.   Past  Surgical History  Breast Biopsy  Bilateral. Colon Polyp Removal - Colonoscopy  Oral Surgery   Diagnostic Studies History  Colonoscopy  1-5 years ago Mammogram  within last year Pap Smear  1-5 years ago  Allergies) No Known Allergies   Medication History  Atorvastatin Calcium (40MG  Tablet, Oral) Active. Fenofibrate (160MG  Tablet, Oral) Active. Furosemide (20MG  Tablet, Oral) Active. Calcium-Vitamin D (500-200MG -UNIT Tablet, Oral) Active. Vitamin D3 (2000UNIT Tablet, Oral) Active. Fish Oil + D3 (1000-1000MG -UNIT Capsule, Oral) Active. Medications Reconciled  Social History  Caffeine use  Carbonated beverages, Coffee, Tea. No alcohol use  No drug use  Tobacco use  Never smoker.  Family History  Alcohol Abuse  Daughter. Depression  Daughter. Heart disease in female family member before age 65  Hypertension  Sister. Migraine Headache  Sister.  Pregnancy / Birth History Age at menarche  70 years. Age of menopause  51-56 Contraceptive History  Oral contraceptives. Gravida  2 Maternal age  49-20 Para  2  Other Problems  Gastroesophageal Reflux Disease  Hypercholesterolemia     Review of Systems  General Not Present- Appetite Loss, Chills, Fatigue, Fever, Night Sweats, Weight Gain and Weight Loss. Skin Not Present- Change in Wart/Mole, Dryness, Hives, Jaundice, New Lesions, Non-Healing Wounds, Rash and Ulcer. HEENT Present- Wears glasses/contact lenses. Not Present- Earache, Hearing Loss, Hoarseness, Nose Bleed, Oral Ulcers, Ringing in the Ears, Seasonal Allergies, Sinus Pain, Sore Throat, Visual Disturbances and Yellow Eyes. Respiratory Not Present- Bloody sputum, Chronic Cough, Difficulty Breathing, Snoring and Wheezing. Breast Present- Breast Mass. Not Present- Breast Pain, Nipple Discharge and Skin Changes. Cardiovascular Present- Swelling of Extremities. Not Present- Chest Pain, Difficulty Breathing Lying Down, Leg Cramps, Palpitations,  Rapid Heart  Rate and Shortness of Breath. Gastrointestinal Not Present- Abdominal Pain, Bloating, Bloody Stool, Change in Bowel Habits, Chronic diarrhea, Constipation, Difficulty Swallowing, Excessive gas, Gets full quickly at meals, Hemorrhoids, Indigestion, Nausea, Rectal Pain and Vomiting. Female Genitourinary Not Present- Frequency, Nocturia, Painful Urination, Pelvic Pain and Urgency. Musculoskeletal Not Present- Back Pain, Joint Pain, Joint Stiffness, Muscle Pain, Muscle Weakness and Swelling of Extremities. Neurological Not Present- Decreased Memory, Fainting, Headaches, Numbness, Seizures, Tingling, Tremor, Trouble walking and Weakness. Psychiatric Not Present- Anxiety, Bipolar, Change in Sleep Pattern, Depression, Fearful and Frequent crying. Endocrine Not Present- Cold Intolerance, Excessive Hunger, Hair Changes, Heat Intolerance, Hot flashes and New Diabetes. Hematology Not Present- Blood Thinners, Easy Bruising, Excessive bleeding, Gland problems, HIV and Persistent Infections.  Vitals  Weight: 149.6 lb Height: 65in Body Surface Area: 1.75 m Body Mass Index: 24.89 kg/m  Temp.: 97.100F  Pulse: 93 (Regular)  BP: 136/86 (Sitting, Left Arm, Standard)       Physical Exam  General Mental Status-Alert. General Appearance-Consistent with stated age. Hydration-Well hydrated. Voice-Normal. Note: Pleasant. Intelligent. Good insight.   Head and Neck Head-normocephalic, atraumatic with no lesions or palpable masses. Trachea-midline. Thyroid Gland Characteristics - normal size and consistency.  Eye Eyeball - Bilateral-Extraocular movements intact. Sclera/Conjunctiva - Bilateral-No scleral icterus.  Chest and Lung Exam Chest and lung exam reveals -quiet, even and easy respiratory effort with no use of accessory muscles and on auscultation, normal breath sounds, no adventitious sounds and normal vocal resonance. Inspection Chest Wall - Normal.  Back - normal.  Breast Note: Both breasts are medium sized. No significant skin changes. Nipple and areola complexes looked normal. In the right breast upper slightly outer there is a little bit of lumpiness which may be resolving hematoma. No dominant mass in either breast. No axillary adenopathy.   Cardiovascular Cardiovascular examination reveals -normal heart sounds, regular rate and rhythm with no murmurs and normal pedal pulses bilaterally.  Abdomen Inspection Inspection of the abdomen reveals - No Hernias. Skin - Scar - no surgical scars. Palpation/Percussion Palpation and Percussion of the abdomen reveal - Soft, Non Tender, No Rebound tenderness, No Rigidity (guarding) and No hepatosplenomegaly. Auscultation Auscultation of the abdomen reveals - Bowel sounds normal.  Neurologic Neurologic evaluation reveals -alert and oriented x 3 with no impairment of recent or remote memory. Mental Status-Normal.  Musculoskeletal Normal Exam - Left-Upper Extremity Strength Normal and Lower Extremity Strength Normal. Normal Exam - Right-Upper Extremity Strength Normal and Lower Extremity Strength Normal.  Lymphatic Head & Neck  General Head & Neck Lymphatics: Bilateral - Description - Normal. Axillary  General Axillary Region: Bilateral - Description - Normal. Tenderness - Non Tender. Femoral & Inguinal  Generalized Femoral & Inguinal Lymphatics: Bilateral - Description - Normal. Tenderness - Non Tender.    Assessment & Plan  ABNORMALITY OF RIGHT BREAST ON SCREENING MAMMOGRAM (R92.8)   Your recent mammograms showed an area of distortion on the right side and an area of calcifications on the left side. The biopsy on the left side is a fibroadenoma which is completely benign On the right side you had a condition called complex sclerosing lesion You probably do not have cancer, but there is somewhere between 5-10% chance that this would be upgraded to a malignancy on the  excisional biopsy  You do not seem to have any other risk factors Nevertheless, you have been advised to consider right breast lumpectomy with radioactive seed localization We have discussed the indications, techniques, and risks of this surgery in detail You state that  you want to go ahead with this surgery  Please read the patient information booklet that I gave you.  HYPERLIPIDEMIA, ACQUIRED (E78.5)   Edsel Petrin. Dalbert Batman, M.D., [facility] General and Minimally invasive Surgery Breast and Colorectal Surgery Office:   [phone] Pager:   [phone]

## 2016-09-22 ENCOUNTER — Inpatient Hospital Stay: Admission: RE | Admit: 2016-09-22 | Payer: Medicare Other | Source: Ambulatory Visit

## 2016-09-28 ENCOUNTER — Ambulatory Visit: Payer: Medicare Other | Admitting: Nurse Practitioner

## 2016-10-09 ENCOUNTER — Ambulatory Visit: Payer: Medicare Other | Admitting: Nurse Practitioner

## 2016-10-13 ENCOUNTER — Ambulatory Visit (INDEPENDENT_AMBULATORY_CARE_PROVIDER_SITE_OTHER): Payer: Medicare Other | Admitting: Nurse Practitioner

## 2016-10-13 ENCOUNTER — Encounter: Payer: Self-pay | Admitting: Nurse Practitioner

## 2016-10-13 VITALS — BP 126/83 | HR 68 | Temp 97.1°F | Ht 64.0 in | Wt 147.0 lb

## 2016-10-13 DIAGNOSIS — R600 Localized edema: Secondary | ICD-10-CM

## 2016-10-13 DIAGNOSIS — R609 Edema, unspecified: Secondary | ICD-10-CM | POA: Diagnosis not present

## 2016-10-13 DIAGNOSIS — M8580 Other specified disorders of bone density and structure, unspecified site: Secondary | ICD-10-CM

## 2016-10-13 DIAGNOSIS — E782 Mixed hyperlipidemia: Secondary | ICD-10-CM | POA: Diagnosis not present

## 2016-10-13 DIAGNOSIS — E559 Vitamin D deficiency, unspecified: Secondary | ICD-10-CM | POA: Diagnosis not present

## 2016-10-13 DIAGNOSIS — Z6826 Body mass index (BMI) 26.0-26.9, adult: Secondary | ICD-10-CM | POA: Diagnosis not present

## 2016-10-13 MED ORDER — ATORVASTATIN CALCIUM 40 MG PO TABS: 40.0000 mg | ORAL_TABLET | Freq: Every day | ORAL | 5 refills | Status: DC

## 2016-10-13 MED ORDER — FENOFIBRATE 160 MG PO TABS: 160.0000 mg | ORAL_TABLET | Freq: Every day | ORAL | 5 refills | Status: DC

## 2016-10-13 NOTE — Addendum Note (Signed)
Addended by: Rolena Infante on: 10/13/2016 09:48 AM   Modules accepted: Orders

## 2016-10-13 NOTE — Progress Notes (Signed)
Subjective:    Patient ID: Lori Ross, female    DOB: 11-23-1950, 66 y.o.   MRN: [id number]  HPI   Lori Ross is here today for follow up of chronic medical problem.  Outpatient Encounter Prescriptions as of 10/13/2016  Medication Sig  . atorvastatin (LIPITOR) 40 MG tablet Take 1 tablet (40 mg total) by mouth daily.  . calcium-vitamin D (OSCAL WITH D) 500-200 MG-UNIT per tablet Take 1 tablet by mouth daily.  . Cholecalciferol (VITAMIN D3) 2000 UNITS TABS Take 1 tablet by mouth daily.    . fenofibrate 160 MG tablet Take 1 tablet (160 mg total) by mouth daily.  . fish oil-omega-3 fatty acids 1000 MG capsule Take 2 g by mouth daily.  . furosemide (LASIX) 20 MG tablet 1/2 - 1 po qd prn   No facility-administered encounter medications on file as of 10/13/2016.     1. Osteopenia, unspecified location  Does exercise 2-3 x a week  2. Vitamin D deficiency  Vitamin d OTC daily  3. Peripheral edema  Only takes lasix prn  4. Mixed hyperlipidemia  Tries to watch diet  5. BMI 26.0-26.9,adult  Weight down 8lbs since last visit    New complaints: None today  Social history: Having breast surgery on 10/27/16   Review of Systems  Constitutional: Negative for activity change and appetite change.  HENT: Negative.   Eyes: Negative for pain.  Respiratory: Negative for shortness of breath.   Cardiovascular: Negative for chest pain, palpitations and leg swelling.  Gastrointestinal: Negative for abdominal pain.  Endocrine: Negative for polydipsia.  Genitourinary: Negative.   Skin: Negative for rash.  Neurological: Negative for dizziness, weakness and headaches.  Hematological: Does not bruise/bleed easily.  Psychiatric/Behavioral: Negative.   All other systems reviewed and are negative.      Objective:   Physical Exam  Constitutional: She is oriented to person, place, and time. She appears well-developed and well-nourished.  HENT:  Nose: Nose normal.  Mouth/Throat: Oropharynx  is clear and moist.  Eyes: EOM are normal.  Neck: Trachea normal, normal range of motion and full passive range of motion without pain. Neck supple. No JVD present. Carotid bruit is not present. No thyromegaly present.  Cardiovascular: Normal rate, regular rhythm, normal heart sounds and intact distal pulses.  Exam reveals no gallop and no friction rub.   No murmur heard. Pulmonary/Chest: Effort normal and breath sounds normal.  Abdominal: Soft. Bowel sounds are normal. She exhibits no distension and no mass. There is no tenderness.  Musculoskeletal: Normal range of motion.  Lymphadenopathy:    She has no cervical adenopathy.  Neurological: She is alert and oriented to person, place, and time. She has normal reflexes.  Skin: Skin is warm and dry.  Psychiatric: She has a normal mood and affect. Her behavior is normal. Judgment and thought content normal.   BP 126/83   Pulse 68   Temp (!) 97.1 F (36.2 C) (Oral)   Ht _0  (1.626 m)   Wt 147 lb (66.7 kg)   BMI 25.23 kg/m       Assessment & Plan:  1. Osteopenia, unspecified location Weight bearing exercise encouraged  2. Vitamin D deficiency  3. Peripheral edema Lasix as needed  4. Mixed hyperlipidemia Low fat diet - atorvastatin (LIPITOR) 40 MG tablet; Take 1 tablet (40 mg total) by mouth daily.  Dispense: 30 tablet; Refill: 5 - fenofibrate 160 MG tablet; Take 1 tablet (160 mg total) by mouth daily.  Dispense:  30 tablet; Refill: 5 - CMP14+EGFR - Lipid panel  5. BMI 26.0-26.9,adult Discussed diet and exercise for person with BMI >25 Will recheck weight in 3-6 months     Labs pending Health maintenance reviewed Diet and exercise encouraged Continue all meds Follow up  In 6 month   [place], FNP

## 2016-10-13 NOTE — Patient Instructions (Signed)

## 2016-10-14 LAB — LIPID PANEL
CHOLESTEROL TOTAL: 141 mg/dL (ref 100–199)
Chol/HDL Ratio: 3.4 ratio (ref 0.0–4.4)
HDL: 41 mg/dL (ref >39)
LDL Calculated: 76 mg/dL (ref 0–99)
TRIGLYCERIDES: 121 mg/dL (ref 0–149)
VLDL CHOLESTEROL CAL: 24 mg/dL (ref 5–40)

## 2016-10-14 LAB — CMP14+EGFR
A/G RATIO: 1.8 (ref 1.2–2.2)
ALK PHOS: 34 IU/L — AB (ref 39–117)
ALT: 19 IU/L (ref 0–32)
AST: 22 IU/L (ref 0–40)
Albumin: 4.6 g/dL (ref 3.6–4.8)
BILIRUBIN TOTAL: 0.5 mg/dL (ref 0.0–1.2)
BUN/Creatinine Ratio: 27 (ref 12–28)
BUN: 21 mg/dL (ref 8–27)
CHLORIDE: 106 mmol/L (ref 96–106)
CO2: 23 mmol/L (ref 20–29)
Calcium: 9.7 mg/dL (ref 8.7–10.3)
Creatinine, Ser: 0.79 mg/dL (ref 0.57–1.00)
GFR calc non Af Amer: 79 mL/min/{1.73_m2} (ref >59)
GFR, EST AFRICAN AMERICAN: 91 mL/min/{1.73_m2} (ref >59)
Globulin, Total: 2.6 g/dL (ref 1.5–4.5)
Glucose: 84 mg/dL (ref 65–99)
POTASSIUM: 5 mmol/L (ref 3.5–5.2)
Sodium: 145 mmol/L — ABNORMAL HIGH (ref 134–144)
TOTAL PROTEIN: 7.2 g/dL (ref 6.0–8.5)

## 2016-10-19 ENCOUNTER — Encounter (HOSPITAL_BASED_OUTPATIENT_CLINIC_OR_DEPARTMENT_OTHER): Payer: Self-pay | Admitting: *Deleted

## 2016-10-19 ENCOUNTER — Telehealth: Payer: Self-pay | Admitting: *Deleted

## 2016-10-26 ENCOUNTER — Ambulatory Visit
Admission: RE | Admit: 2016-10-26 | Discharge: 2016-10-26 | Disposition: A | Discharge status: Home / Self Care | Payer: Medicare Other | Source: Ambulatory Visit | Attending: General Surgery | Admitting: General Surgery

## 2016-10-26 DIAGNOSIS — R928 Other abnormal and inconclusive findings on diagnostic imaging of breast: Secondary | ICD-10-CM | POA: Diagnosis not present

## 2016-10-26 NOTE — Progress Notes (Signed)
Ensure pre surgery drink given with instructions to complete by 0445 dos, pt verbalized understanding. 

## 2016-10-26 NOTE — H&P (Signed)
Lori Ross Location: [facility] Patient #: [id number] DOB: 1950-11-29 Widowed / Language: Cleophus Molt / Race: White Female       History of Present Illness       This is a 66 year old female from [place]. She is referred to me by Dr. Autumn Patty at the [facility] for evaluation of complex sclerosing lesion right breast, upper, slightly outer right breast. Chevis Pretty, FNP at [facility] practice is her primary care provider. Yasmin served as my Producer, television/film/video throughout the encounter.     She has no prior breast problems. Recent screening mammograms revealed an area of distortion in the right breast, upper slightly outer. That was biopsied include showed complex sclerosing lesion and usual ductal hyperplasia. She also had focal calcifications in the lower inner quadrant of the left breast. 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      She is generally healthy. Takes fluid pills occasionally. Hyperlipidemia. Family history is negative for breast or ovarian cancer Social history reveals she is a widow. Lives alone in [place]. 2 children. 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 Denies tobacco or alcohol.       We had a long talk. I explained complex sclerosing lesion and hyperplasia to her. I told her there was a 4-9% chance that this will be upgraded to a malignancy on excisional biopsy. She would like to go ahead and have this area excised.       She'll be scheduled for right breast lumpectomy with radioactive seed localization. I discussed the indications, details, techniques, numerous risk of the surgery with her. She is aware of the risk of bleeding, infection, cosmetic deformity, nerve damage with chronic pain or numbness, reoperation if cancer. She understands all these issues. All her questions are answered. She agrees with this plan.   Past Surgical History  Breast Biopsy  Bilateral. Colon  Polyp Removal - Colonoscopy  Oral Surgery   Diagnostic Studies History  Colonoscopy  1-5 years ago Mammogram  within last year Pap Smear  1-5 years ago  Allergies  No Known Allergies   Medication History  Atorvastatin Calcium (40MG  Tablet, Oral) Active. Fenofibrate (160MG  Tablet, Oral) Active. Furosemide (20MG  Tablet, Oral) Active. Calcium-Vitamin D (500-200MG -UNIT Tablet, Oral) Active. Vitamin D3 (2000UNIT Tablet, Oral) Active. Fish Oil + D3 (1000-1000MG -UNIT Capsule, Oral) Active. Medications Reconciled  Social History  Caffeine use  Carbonated beverages, Coffee, Tea. No alcohol use  No drug use  Tobacco use  Never smoker.  Family History  Alcohol Abuse  Daughter. Depression  Daughter. Heart disease in female family member before age 44  Hypertension  Sister. Migraine Headache  Sister.  Pregnancy / Birth History  Age at menarche  19 years. Age of menopause  [age] Contraceptive History  Oral contraceptives. Gravida  2 Maternal age  60-20 Para  2  Other Problems Gastroesophageal Reflux Disease  Hypercholesterolemia     Review of Systems  General Not Present- Appetite Loss, Chills, Fatigue, Fever, Night Sweats, Weight Gain and Weight Loss. Skin Not Present- Change in Wart/Mole, Dryness, Hives, Jaundice, New Lesions, Non-Healing Wounds, Rash and Ulcer. HEENT Present- Wears glasses/contact lenses. Not Present- Earache, Hearing Loss, Hoarseness, Nose Bleed, Oral Ulcers, Ringing in the Ears, Seasonal Allergies, Sinus Pain, Sore Throat, Visual Disturbances and Yellow Eyes. Respiratory Not Present- Bloody sputum, Chronic Cough, Difficulty Breathing, Snoring and Wheezing. Breast Present- Breast Mass. Not Present- Breast Pain, Nipple Discharge and Skin Changes. Cardiovascular Present- Swelling of Extremities. Not Present- Chest Pain,  Difficulty Breathing Lying Down, Leg Cramps, Palpitations, Rapid Heart Rate and Shortness of  Breath. Gastrointestinal Not Present- Abdominal Pain, Bloating, Bloody Stool, Change in Bowel Habits, Chronic diarrhea, Constipation, Difficulty Swallowing, Excessive gas, Gets full quickly at meals, Hemorrhoids, Indigestion, Nausea, Rectal Pain and Vomiting. Female Genitourinary Not Present- Frequency, Nocturia, Painful Urination, Pelvic Pain and Urgency. Musculoskeletal Not Present- Back Pain, Joint Pain, Joint Stiffness, Muscle Pain, Muscle Weakness and Swelling of Extremities. Neurological Not Present- Decreased Memory, Fainting, Headaches, Numbness, Seizures, Tingling, Tremor, Trouble walking and Weakness. Psychiatric Not Present- Anxiety, Bipolar, Change in Sleep Pattern, Depression, Fearful and Frequent crying. Endocrine Not Present- Cold Intolerance, Excessive Hunger, Hair Changes, Heat Intolerance, Hot flashes and New Diabetes. Hematology Not Present- Blood Thinners, Easy Bruising, Excessive bleeding, Gland problems, HIV and Persistent Infections.  Vitals Weight: 149.6 lb Height: 65in Body Surface Area: 1.75 m Body Mass Index: 24.89 kg/m  Temp.: 97.74F  Pulse: 93 (Regular)  BP: 136/86 (Sitting, Left Arm, Standard)    Physical Exam  General Mental Status-Alert. General Appearance-Consistent with stated age. Hydration-Well hydrated. Voice-Normal. Note: Pleasant. Intelligent. Good insight.   Head and Neck Head-normocephalic, atraumatic with no lesions or palpable masses. Trachea-midline. Thyroid Gland Characteristics - normal size and consistency.  Eye Eyeball - Bilateral-Extraocular movements intact. Sclera/Conjunctiva - Bilateral-No scleral icterus.  Chest and Lung Exam Chest and lung exam reveals -quiet, even and easy respiratory effort with no use of accessory muscles and on auscultation, normal breath sounds, no adventitious sounds and normal vocal resonance. Inspection Chest Wall - Normal. Back - normal.  Breast Note: Both  breasts are medium sized. No significant skin changes. Nipple and areola complexes looked normal. In the right breast upper slightly outer there is a little bit of lumpiness which may be resolving hematoma. No dominant mass in either breast. No axillary adenopathy.   Cardiovascular Cardiovascular examination reveals -normal heart sounds, regular rate and rhythm with no murmurs and normal pedal pulses bilaterally.  Abdomen Inspection Inspection of the abdomen reveals - No Hernias. Skin - Scar - no surgical scars. Palpation/Percussion Palpation and Percussion of the abdomen reveal - Soft, Non Tender, No Rebound tenderness, No Rigidity (guarding) and No hepatosplenomegaly. Auscultation Auscultation of the abdomen reveals - Bowel sounds normal.  Neurologic Neurologic evaluation reveals -alert and oriented x 3 with no impairment of recent or remote memory. Mental Status-Normal.  Musculoskeletal Normal Exam - Left-Upper Extremity Strength Normal and Lower Extremity Strength Normal. Normal Exam - Right-Upper Extremity Strength Normal and Lower Extremity Strength Normal.  Lymphatic Head & Neck  General Head & Neck Lymphatics: Bilateral - Description - Normal. Axillary  General Axillary Region: Bilateral - Description - Normal. Tenderness - Non Tender. Femoral & Inguinal  Generalized Femoral & Inguinal Lymphatics: Bilateral - Description - Normal. Tenderness - Non Tender.    Assessment & Plan  ABNORMALITY OF RIGHT BREAST ON SCREENING MAMMOGRAM (R92.8)    Your recent mammograms showed an area of distortion on the right side and an area of calcifications on the left side. The biopsy on the left side is a fibroadenoma which is completely benign On the right side you had a condition called complex sclerosing lesion You probably do not have cancer, but there is somewhere between 5-10% chance that this would be upgraded to a malignancy on the excisional biopsy  You do not seem  to have any other risk factors Nevertheless, you have been advised to consider right breast lumpectomy with radioactive seed localization We have discussed the indications, techniques, and risks of  this surgery in detail You state that you want to go ahead with this surgery  Please read the patient information booklet that I gave you.  HYPERLIPIDEMIA, ACQUIRED (E78.5)    Edsel Petrin. Dalbert Batman, M.D., [facility] General and Minimally invasive Surgery Breast and Colorectal Surgery Office:   [phone] Pager:   [phone]

## 2016-10-27 ENCOUNTER — Ambulatory Visit (HOSPITAL_BASED_OUTPATIENT_CLINIC_OR_DEPARTMENT_OTHER)
Admission: RE | Admit: 2016-10-27 | Discharge: 2016-10-27 | Disposition: A | Discharge status: Home / Self Care | Payer: Medicare Other | Source: Ambulatory Visit | Attending: General Surgery | Admitting: General Surgery

## 2016-10-27 ENCOUNTER — Ambulatory Visit (HOSPITAL_BASED_OUTPATIENT_CLINIC_OR_DEPARTMENT_OTHER): Payer: Medicare Other | Admitting: Anesthesiology

## 2016-10-27 ENCOUNTER — Ambulatory Visit
Admission: RE | Admit: 2016-10-27 | Discharge: 2016-10-27 | Disposition: A | Discharge status: Home / Self Care | Payer: Medicare Other | Source: Ambulatory Visit | Attending: General Surgery | Admitting: General Surgery

## 2016-10-27 ENCOUNTER — Encounter (HOSPITAL_BASED_OUTPATIENT_CLINIC_OR_DEPARTMENT_OTHER)
Admission: RE | Disposition: A | Discharge status: Home / Self Care | Payer: Self-pay | Source: Ambulatory Visit | Attending: General Surgery

## 2016-10-27 ENCOUNTER — Encounter (HOSPITAL_BASED_OUTPATIENT_CLINIC_OR_DEPARTMENT_OTHER): Payer: Self-pay | Admitting: *Deleted

## 2016-10-27 DIAGNOSIS — R928 Other abnormal and inconclusive findings on diagnostic imaging of breast: Secondary | ICD-10-CM

## 2016-10-27 DIAGNOSIS — Z86010 Personal history of colonic polyps: Secondary | ICD-10-CM | POA: Insufficient documentation

## 2016-10-27 DIAGNOSIS — Z79899 Other long term (current) drug therapy: Secondary | ICD-10-CM | POA: Diagnosis not present

## 2016-10-27 DIAGNOSIS — D241 Benign neoplasm of right breast: Secondary | ICD-10-CM | POA: Diagnosis not present

## 2016-10-27 DIAGNOSIS — E785 Hyperlipidemia, unspecified: Secondary | ICD-10-CM | POA: Diagnosis not present

## 2016-10-27 DIAGNOSIS — K219 Gastro-esophageal reflux disease without esophagitis: Secondary | ICD-10-CM | POA: Insufficient documentation

## 2016-10-27 DIAGNOSIS — N6021 Fibroadenosis of right breast: Secondary | ICD-10-CM | POA: Diagnosis not present

## 2016-10-27 DIAGNOSIS — N6011 Diffuse cystic mastopathy of right breast: Secondary | ICD-10-CM | POA: Diagnosis not present

## 2016-10-27 DIAGNOSIS — E7800 Pure hypercholesterolemia, unspecified: Secondary | ICD-10-CM | POA: Diagnosis not present

## 2016-10-27 DIAGNOSIS — R921 Mammographic calcification found on diagnostic imaging of breast: Secondary | ICD-10-CM | POA: Diagnosis not present

## 2016-10-27 DIAGNOSIS — M8580 Other specified disorders of bone density and structure, unspecified site: Secondary | ICD-10-CM | POA: Diagnosis not present

## 2016-10-27 DIAGNOSIS — N6310 Unspecified lump in the right breast, unspecified quadrant: Secondary | ICD-10-CM | POA: Diagnosis present

## 2016-10-27 DIAGNOSIS — N6489 Other specified disorders of breast: Secondary | ICD-10-CM | POA: Diagnosis not present

## 2016-10-27 HISTORY — DX: Unspecified lump in the right breast, unspecified quadrant: N63.10

## 2016-10-27 HISTORY — PX: BREAST LUMPECTOMY WITH RADIOACTIVE SEED LOCALIZATION: SHX6424

## 2016-10-27 SURGERY — BREAST LUMPECTOMY WITH RADIOACTIVE SEED LOCALIZATION
Anesthesia: General | Site: Breast | Laterality: Right

## 2016-10-27 MED ORDER — EPHEDRINE 5 MG/ML INJ
INTRAVENOUS | Status: AC
  Filled 2016-10-27: qty 10

## 2016-10-27 MED ORDER — SUCCINYLCHOLINE CHLORIDE 200 MG/10ML IV SOSY
PREFILLED_SYRINGE | INTRAVENOUS | Status: AC
  Filled 2016-10-27: qty 10

## 2016-10-27 MED ORDER — CHLORHEXIDINE GLUCONATE CLOTH 2 % EX PADS: 6.0000 | MEDICATED_PAD | Freq: Once | CUTANEOUS | Status: DC

## 2016-10-27 MED ORDER — KETOROLAC TROMETHAMINE 30 MG/ML IJ SOLN: 30.0000 mg | Freq: Once | INTRAMUSCULAR | Status: DC | PRN

## 2016-10-27 MED ORDER — BUPIVACAINE-EPINEPHRINE (PF) 0.5% -1:200000 IJ SOLN
INTRAMUSCULAR | Status: AC
  Filled 2016-10-27: qty 30

## 2016-10-27 MED ORDER — HYDROCODONE-ACETAMINOPHEN 5-325 MG PO TABS: 1.0000 | ORAL_TABLET | Freq: Four times a day (QID) | ORAL | 0 refills | Status: DC | PRN

## 2016-10-27 MED ORDER — SCOPOLAMINE 1 MG/3DAYS TD PT72: 1.0000 | MEDICATED_PATCH | Freq: Once | TRANSDERMAL | Status: DC | PRN

## 2016-10-27 MED ORDER — ACETAMINOPHEN 500 MG PO TABS
1000.0000 mg | ORAL_TABLET | ORAL | Status: AC
  Administered 2016-10-27: 1000 mg via ORAL

## 2016-10-27 MED ORDER — ONDANSETRON HCL 4 MG/2ML IJ SOLN
INTRAMUSCULAR | Status: DC | PRN
  Administered 2016-10-27: 4 mg via INTRAVENOUS

## 2016-10-27 MED ORDER — MIDAZOLAM HCL 2 MG/2ML IJ SOLN: 1.0000 mg | INTRAMUSCULAR | Status: DC | PRN

## 2016-10-27 MED ORDER — ONDANSETRON HCL 4 MG/2ML IJ SOLN
INTRAMUSCULAR | Status: AC
  Filled 2016-10-27: qty 2

## 2016-10-27 MED ORDER — ACETAMINOPHEN 325 MG PO TABS: 325.0000 mg | ORAL_TABLET | ORAL | Status: DC | PRN

## 2016-10-27 MED ORDER — PHENYLEPHRINE 40 MCG/ML (10ML) SYRINGE FOR IV PUSH (FOR BLOOD PRESSURE SUPPORT)
PREFILLED_SYRINGE | INTRAVENOUS | Status: AC
  Filled 2016-10-27: qty 10

## 2016-10-27 MED ORDER — MIDAZOLAM HCL 2 MG/2ML IJ SOLN
INTRAMUSCULAR | Status: AC
  Filled 2016-10-27: qty 2

## 2016-10-27 MED ORDER — LIDOCAINE HCL (CARDIAC) 20 MG/ML IV SOLN
INTRAVENOUS | Status: DC | PRN
  Administered 2016-10-27: 100 mg via INTRAVENOUS

## 2016-10-27 MED ORDER — CELECOXIB 200 MG PO CAPS
ORAL_CAPSULE | ORAL | Status: AC
  Filled 2016-10-27: qty 2

## 2016-10-27 MED ORDER — CEFAZOLIN SODIUM-DEXTROSE 2-4 GM/100ML-% IV SOLN
INTRAVENOUS | Status: AC
  Filled 2016-10-27: qty 100

## 2016-10-27 MED ORDER — LACTATED RINGERS IV SOLN
INTRAVENOUS | Status: DC
  Administered 2016-10-27 (×2): via INTRAVENOUS

## 2016-10-27 MED ORDER — GABAPENTIN 300 MG PO CAPS
300.0000 mg | ORAL_CAPSULE | ORAL | Status: AC
  Administered 2016-10-27: 300 mg via ORAL

## 2016-10-27 MED ORDER — ACETAMINOPHEN 160 MG/5ML PO SOLN: 325.0000 mg | ORAL | Status: DC | PRN

## 2016-10-27 MED ORDER — DEXAMETHASONE SODIUM PHOSPHATE 4 MG/ML IJ SOLN
INTRAMUSCULAR | Status: DC | PRN
  Administered 2016-10-27: 10 mg via INTRAVENOUS

## 2016-10-27 MED ORDER — FENTANYL CITRATE (PF) 100 MCG/2ML IJ SOLN
50.0000 ug | INTRAMUSCULAR | Status: DC | PRN
  Administered 2016-10-27 (×2): 50 ug via INTRAVENOUS

## 2016-10-27 MED ORDER — FENTANYL CITRATE (PF) 100 MCG/2ML IJ SOLN
INTRAMUSCULAR | Status: AC
  Filled 2016-10-27: qty 2

## 2016-10-27 MED ORDER — FENTANYL CITRATE (PF) 100 MCG/2ML IJ SOLN: 25.0000 ug | INTRAMUSCULAR | Status: DC | PRN

## 2016-10-27 MED ORDER — GABAPENTIN 300 MG PO CAPS
ORAL_CAPSULE | ORAL | Status: AC
  Filled 2016-10-27: qty 1

## 2016-10-27 MED ORDER — MIDAZOLAM HCL 2 MG/2ML IJ SOLN
1.0000 mg | INTRAMUSCULAR | Status: DC | PRN
  Administered 2016-10-27 (×2): 1 mg via INTRAVENOUS

## 2016-10-27 MED ORDER — MEPERIDINE HCL 25 MG/ML IJ SOLN: 6.2500 mg | INTRAMUSCULAR | Status: DC | PRN

## 2016-10-27 MED ORDER — CEFAZOLIN SODIUM-DEXTROSE 2-4 GM/100ML-% IV SOLN
2.0000 g | INTRAVENOUS | Status: AC
  Administered 2016-10-27: 2 g via INTRAVENOUS

## 2016-10-27 MED ORDER — OXYCODONE HCL 5 MG/5ML PO SOLN: 5.0000 mg | Freq: Once | ORAL | Status: DC | PRN

## 2016-10-27 MED ORDER — BUPIVACAINE-EPINEPHRINE (PF) 0.5% -1:200000 IJ SOLN
INTRAMUSCULAR | Status: DC | PRN
  Administered 2016-10-27: 10 mL via PERINEURAL

## 2016-10-27 MED ORDER — PROPOFOL 10 MG/ML IV BOLUS
INTRAVENOUS | Status: DC | PRN
  Administered 2016-10-27: 150 mg via INTRAVENOUS

## 2016-10-27 MED ORDER — PROPOFOL 500 MG/50ML IV EMUL
INTRAVENOUS | Status: AC
  Filled 2016-10-27: qty 50

## 2016-10-27 MED ORDER — OXYCODONE HCL 5 MG PO TABS: 5.0000 mg | ORAL_TABLET | Freq: Once | ORAL | Status: DC | PRN

## 2016-10-27 MED ORDER — ONDANSETRON HCL 4 MG/2ML IJ SOLN: 4.0000 mg | Freq: Once | INTRAMUSCULAR | Status: DC | PRN

## 2016-10-27 MED ORDER — LIDOCAINE 2% (20 MG/ML) 5 ML SYRINGE
INTRAMUSCULAR | Status: AC
  Filled 2016-10-27: qty 5

## 2016-10-27 MED ORDER — DEXAMETHASONE SODIUM PHOSPHATE 10 MG/ML IJ SOLN
INTRAMUSCULAR | Status: AC
  Filled 2016-10-27: qty 1

## 2016-10-27 MED ORDER — CELECOXIB 400 MG PO CAPS
400.0000 mg | ORAL_CAPSULE | ORAL | Status: AC
  Administered 2016-10-27: 400 mg via ORAL

## 2016-10-27 MED ORDER — ACETAMINOPHEN 500 MG PO TABS
ORAL_TABLET | ORAL | Status: AC
  Filled 2016-10-27: qty 2

## 2016-10-27 SURGICAL SUPPLY — 63 items
ADH SKN CLS APL DERMABOND .7 (GAUZE/BANDAGES/DRESSINGS) ×1
APL SKNCLS STERI-STRIP NONHPOA (GAUZE/BANDAGES/DRESSINGS)
APPLIER CLIP 9.375 MED OPEN (MISCELLANEOUS)
APR CLP MED 9.3 20 MLT OPN (MISCELLANEOUS)
BENZOIN TINCTURE PRP APPL 2/3 (GAUZE/BANDAGES/DRESSINGS) IMPLANT
BINDER BREAST LRG (GAUZE/BANDAGES/DRESSINGS) IMPLANT
BINDER BREAST MEDIUM (GAUZE/BANDAGES/DRESSINGS) ×2 IMPLANT
BINDER BREAST XLRG (GAUZE/BANDAGES/DRESSINGS) IMPLANT
BINDER BREAST XXLRG (GAUZE/BANDAGES/DRESSINGS) IMPLANT
BLADE HEX COATED 2.75 (ELECTRODE) ×3 IMPLANT
BLADE SURG 10 STRL SS (BLADE) IMPLANT
BLADE SURG 15 STRL LF DISP TIS (BLADE) ×1 IMPLANT
BLADE SURG 15 STRL SS (BLADE) ×3
CANISTER SUC SOCK COL 7IN (MISCELLANEOUS) IMPLANT
CANISTER SUCT 1200ML W/VALVE (MISCELLANEOUS) ×3 IMPLANT
CHLORAPREP W/TINT 26ML (MISCELLANEOUS) ×3 IMPLANT
CLIP APPLIE 9.375 MED OPEN (MISCELLANEOUS) IMPLANT
CLOSURE WOUND 1/2 X4 (GAUZE/BANDAGES/DRESSINGS)
COVER BACK TABLE 60X90IN (DRAPES) ×3 IMPLANT
COVER MAYO STAND STRL (DRAPES) ×3 IMPLANT
COVER PROBE W GEL 5X96 (DRAPES) ×3 IMPLANT
DECANTER SPIKE VIAL GLASS SM (MISCELLANEOUS) IMPLANT
DERMABOND ADVANCED (GAUZE/BANDAGES/DRESSINGS) ×2
DERMABOND ADVANCED .7 DNX12 (GAUZE/BANDAGES/DRESSINGS) ×1 IMPLANT
DEVICE DUBIN W/COMP PLATE 8390 (MISCELLANEOUS) ×3 IMPLANT
DRAPE LAPAROSCOPIC ABDOMINAL (DRAPES) ×3 IMPLANT
DRAPE UTILITY XL STRL (DRAPES) ×3 IMPLANT
DRSG PAD ABDOMINAL 8X10 ST (GAUZE/BANDAGES/DRESSINGS) IMPLANT
ELECT REM PT RETURN 9FT ADLT (ELECTROSURGICAL) ×3
ELECTRODE REM PT RTRN 9FT ADLT (ELECTROSURGICAL) ×1 IMPLANT
GAUZE SPONGE 4X4 12PLY STRL LF (GAUZE/BANDAGES/DRESSINGS) IMPLANT
GLOVE EUDERMIC 7 POWDERFREE (GLOVE) ×3 IMPLANT
GOWN STRL REUS W/ TWL LRG LVL3 (GOWN DISPOSABLE) ×1 IMPLANT
GOWN STRL REUS W/ TWL XL LVL3 (GOWN DISPOSABLE) ×1 IMPLANT
GOWN STRL REUS W/TWL LRG LVL3 (GOWN DISPOSABLE) ×3
GOWN STRL REUS W/TWL XL LVL3 (GOWN DISPOSABLE) ×3
ILLUMINATOR WAVEGUIDE N/F (MISCELLANEOUS) IMPLANT
KIT MARKER MARGIN INK (KITS) ×3 IMPLANT
LIGHT WAVEGUIDE WIDE FLAT (MISCELLANEOUS) IMPLANT
NDL HYPO 25X1 1.5 SAFETY (NEEDLE) ×1 IMPLANT
NEEDLE HYPO 25X1 1.5 SAFETY (NEEDLE) ×3 IMPLANT
NS IRRIG 1000ML POUR BTL (IV SOLUTION) ×3 IMPLANT
PACK BASIN DAY SURGERY FS (CUSTOM PROCEDURE TRAY) ×3 IMPLANT
PENCIL BUTTON HOLSTER BLD 10FT (ELECTRODE) ×3 IMPLANT
SHEET MEDIUM DRAPE 40X70 STRL (DRAPES) IMPLANT
SLEEVE SCD COMPRESS KNEE MED (MISCELLANEOUS) ×3 IMPLANT
SPONGE LAP 18X18 X RAY DECT (DISPOSABLE) IMPLANT
SPONGE LAP 4X18 X RAY DECT (DISPOSABLE) ×3 IMPLANT
STRIP CLOSURE SKIN 1/2X4 (GAUZE/BANDAGES/DRESSINGS) IMPLANT
SUT ETHILON 3 0 FSL (SUTURE) IMPLANT
SUT MNCRL AB 4-0 PS2 18 (SUTURE) ×3 IMPLANT
SUT SILK 2 0 SH (SUTURE) ×3 IMPLANT
SUT VIC AB 2-0 CT1 27 (SUTURE)
SUT VIC AB 2-0 CT1 TAPERPNT 27 (SUTURE) IMPLANT
SUT VIC AB 3-0 SH 27 (SUTURE)
SUT VIC AB 3-0 SH 27X BRD (SUTURE) IMPLANT
SUT VICRYL 3-0 CR8 SH (SUTURE) ×3 IMPLANT
SYR 10ML LL (SYRINGE) ×3 IMPLANT
TOWEL OR 17X24 6PK STRL BLUE (TOWEL DISPOSABLE) ×3 IMPLANT
TOWEL OR NON WOVEN STRL DISP B (DISPOSABLE) IMPLANT
TUBE CONNECTING 20'X1/4 (TUBING) ×1
TUBE CONNECTING 20X1/4 (TUBING) ×2 IMPLANT
YANKAUER SUCT BULB TIP NO VENT (SUCTIONS) ×3 IMPLANT

## 2016-10-27 NOTE — Discharge Instructions (Signed)
[facility] °Office Phone Number [phone] ° °BREAST BIOPSY/ PARTIAL MASTECTOMY: POST OP INSTRUCTIONS ° °Always review your discharge instruction sheet given to you by the facility where your surgery was performed. ° °IF YOU HAVE DISABILITY OR FAMILY LEAVE FORMS, YOU MUST BRING THEM TO THE OFFICE FOR PROCESSING.  DO NOT GIVE THEM TO YOUR DOCTOR. ° °1. A prescription for pain medication may be given to you upon discharge.  Take your pain medication as prescribed, if needed.  If narcotic pain medicine is not needed, then you may take acetaminophen (Tylenol) or ibuprofen (Advil) as needed. °2. Take your usually prescribed medications unless otherwise directed °3. If you need a refill on your pain medication, please contact your pharmacy.  They will contact our office to request authorization.  Prescriptions will not be filled after 5pm or on week-ends. °4. You should eat very light the first 24 hours after surgery, such as soup, crackers, pudding, etc.  Resume your normal diet the day after surgery. °5. Most patients will experience some swelling and bruising in the breast.  Ice packs and a good support bra will help.  Swelling and bruising can take several days to resolve.  °6. It is common to experience some constipation if taking pain medication after surgery.  Increasing fluid intake and taking a stool softener will usually help or prevent this problem from occurring.  A mild laxative (Milk of Magnesia or Miralax) should be taken according to package directions if there are no bowel movements after 48 hours. °7. Unless discharge instructions indicate otherwise, you may remove your bandages 24-48 hours after surgery, and you may shower at that time.  You may have steri-strips (small skin tapes) in place directly over the incision.  These strips should be left on the skin for 7-10 days.  If your surgeon used skin glue on the incision, you may shower in 24 hours.  The glue will flake off over the  next 2-3 weeks.  Any sutures or staples will be removed at the office during your follow-up visit. °8. ACTIVITIES:  You may resume regular daily activities (gradually increasing) beginning the next day.  Wearing a good support bra or sports bra minimizes pain and swelling.  You may have sexual intercourse when it is comfortable. °a. You may drive when you no longer are taking prescription pain medication, you can comfortably wear a seatbelt, and you can safely maneuver your car and apply brakes. °b. RETURN TO WORK:  ______________________________________________________________________________________ °9. You should see your doctor in the office for a follow-up appointment approximately two weeks after your surgery.  Your doctor’s nurse will typically make your follow-up appointment when she calls you with your pathology report.  Expect your pathology report 2-3 business days after your surgery.  You may call to check if you do not hear from us after three days. °10. OTHER INSTRUCTIONS: _______________________________________________________________________________________________ _____________________________________________________________________________________________________________________________________ °_____________________________________________________________________________________________________________________________________ °_____________________________________________________________________________________________________________________________________ ° °WHEN TO CALL YOUR DOCTOR: °1. Fever over 101.0 °2. Nausea and/or vomiting. °3. Extreme swelling or bruising. °4. Continued bleeding from incision. °5. Increased pain, redness, or drainage from the incision. ° °The clinic staff is available to answer your questions during regular business hours.  Please don’t hesitate to call and ask to speak to one of the nurses for clinical concerns.  If you have a medical emergency, go to the nearest  emergency room or call 911.  A surgeon from [facility] is always on call at the hospital. ° °For further questions, please visit [email or web address]  ° ° ° ° °  Post Anesthesia Home Care Instructions ° °Activity: °Get plenty of rest for the remainder of the day. A responsible individual must stay with you for 24 hours following the procedure.  °For the next 24 hours, DO NOT: °-Drive a car °-Operate machinery °-Drink alcoholic beverages °-Take any medication unless instructed by your physician °-Make any legal decisions or sign important papers. ° °Meals: °Start with liquid foods such as gelatin or soup. Progress to regular foods as tolerated. Avoid greasy, spicy, heavy foods. If nausea and/or vomiting occur, drink only clear liquids until the nausea and/or vomiting subsides. Call your physician if vomiting continues. ° °Special Instructions/Symptoms: °Your throat may feel dry or sore from the anesthesia or the breathing tube placed in your throat during surgery. If this causes discomfort, gargle with warm salt water. The discomfort should disappear within 24 hours. ° °If you had a scopolamine patch placed behind your ear for the management of post- operative nausea and/or vomiting: ° °1. The medication in the patch is effective for 72 hours, after which it should be removed.  Wrap patch in a tissue and discard in the trash. Wash hands thoroughly with soap and water. °2. You may remove the patch earlier than 72 hours if you experience unpleasant side effects which may include dry mouth, dizziness or visual disturbances. °3. Avoid touching the patch. Wash your hands with soap and water after contact with the patch. °  ° °

## 2016-10-27 NOTE — Op Note (Signed)
Patient Name:           Lori Ross   Date of Surgery:        10/27/2016  Pre op Diagnosis:      Complex sclerosing lesion right breast, central  Post op Diagnosis:    Same   Procedure:                 Right breast lumpectomy with radioactive seed localization  Surgeon:                     Edsel Petrin. Dalbert Batman, M.D., FACS  Assistant:                      OR staff   Indication for Assistant: N/A  Operative Indications:     This is a 66 year old female from [place]. She is referred to me by Dr. Autumn Patty at the [facility] for evaluation of complex sclerosing lesion right breast, upper, slightly outer right breast. .     She has no prior breast problems. Recent screening mammograms revealed an area of distortion in the right breast, central, upper slightly outer. That was biopsied include showed complex sclerosing lesion and usual ductal hyperplasia. She also had focal calcifications in the lower inner quadrant of the left breast. 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 Family history is negative for breast or ovarian cancer       . I explained complex sclerosing lesion and hyperplasia to her. I told her there was a 4-9% chance that this will be upgraded to a malignancy on excisional biopsy. She would like to go ahead and have this area excised.       She'll be scheduled for right breast lumpectomy with radioactive seed localization.   Operative Findings:       The area of distortion was relatively central in the right breast.  Basically behind the areolar margin at the 11:00 position.  This allowed a circumareolar, hidden scar technique.  Specimen mammogram looked very good with the original biopsy clip and radioactive seed in the relative center of the specimen.  Procedure in Detail:          Following the induction of general LMA anesthesia the patient's right breast was prepped and draped in a sterile fashion, intravenous antibiotics were given,  surgical timeout was performed, and the area anesthetized with 0.5% Marcaine with epinephrine.    I used the neoprobe to plan the incision and the neoprobe was used frequently to direct the lumpectomy.  Circumareolar incision was made.  The lumpectomy was performed using the neoprobe and electrocautery.  The specimen was removed and marked with silk sutures and a 6 color ink kit to orient the pathologist.  Specimen mammogram looked good in the specimen was sent to pathology lab.  Hemostasis was excellent.  The wound was irrigated.  5 metal clips were placed in the walls of the lumpectomy cavity according to our protocol.  The breast tissues were reapproximated with interrupted 3-0 Vicryl sutures and the skin closed with a running subcuticular 4-0 Monocryl and Dermabond.  Dry bandages and a breast binder were placed and patient taken to PACU in stable condition.  EBL 10 mL.  Counts correct.  Complications none.     Edsel Petrin. Dalbert Batman, M.D., FACS General and Minimally Invasive Surgery Breast and Colorectal Surgery   Addendum: I logged onto the [place]  website and reviewed her prescription medication history.  10/27/2016 8:31 AM

## 2016-10-27 NOTE — Transfer of Care (Signed)
Immediate Anesthesia Transfer of Care Note  Patient: VIKTORIYA GLASPY  Procedure(s) Performed: Procedure(s): RIGHT BREAST LUMPECTOMY WITH RADIOACTIVE SEED LOCALIZATION (Right)  Patient Location: PACU  Anesthesia Type:General  Level of Consciousness: awake, alert , oriented and drowsy  Airway & Oxygen Therapy: Patient Spontanous Breathing and Patient connected to face mask oxygen  Post-op Assessment: Report given to RN and Post -op Vital signs reviewed and stable  Post vital signs: Reviewed and stable  Last Vitals:  Vitals:   10/27/16 0655 10/27/16 0835  BP: 129/86 101/72  Pulse: 89 72  Resp: 18   Temp: 36.8 C   SpO2: 100% (!) 83%    Last Pain:  Vitals:   10/27/16 0655  TempSrc: Oral  PainSc: 0-No pain      Patients Stated Pain Goal: 3 (44/58/48 3507)  Complications: No apparent anesthesia complications

## 2016-10-27 NOTE — Interval H&P Note (Signed)
History and Physical Interval Note:  10/27/2016 7:12 AM  Lori Ross  has presented today for surgery, with the diagnosis of RIGHT BREAST CSL  The various methods of treatment have been discussed with the patient and family. After consideration of risks, benefits and other options for treatment, the patient has consented to  Procedure(s): RIGHT BREAST LUMPECTOMY WITH RADIOACTIVE SEED LOCALIZATION (Right) as a surgical intervention .  The patient's history has been reviewed, patient examined, no change in status, stable for surgery.  I have reviewed the patient's chart and labs.  Questions were answered to the patient's satisfaction.     Adin Hector

## 2016-10-27 NOTE — Anesthesia Procedure Notes (Addendum)
Procedure Name: LMA Insertion Date/Time: 10/27/2016 7:41 AM Performed by: Melynda Ripple D Pre-anesthesia Checklist: Patient identified, Emergency Drugs available, Suction available and Patient being monitored Patient Re-evaluated:Patient Re-evaluated prior to induction Oxygen Delivery Method: Circle system utilized Preoxygenation: Pre-oxygenation with 100% oxygen Induction Type: IV induction Ventilation: Mask ventilation without difficulty LMA: LMA inserted LMA Size: 3.0 Number of attempts: 1 Airway Equipment and Method: Bite block Placement Confirmation: positive ETCO2 Tube secured with: Tape Dental Injury: Teeth and Oropharynx as per pre-operative assessment

## 2016-10-27 NOTE — Anesthesia Preprocedure Evaluation (Signed)
Anesthesia Evaluation  Patient identified by MRN, date of birth, ID band Patient awake    Reviewed: Allergy & Precautions, NPO status , Patient's Chart, lab work & pertinent test results  Airway Mallampati: I       Dental no notable dental hx. (+) Teeth Intact   Pulmonary neg pulmonary ROS,    Pulmonary exam normal breath sounds clear to auscultation       Cardiovascular Normal cardiovascular exam Rhythm:Regular Rate:Normal     Neuro/Psych negative neurological ROS  negative psych ROS   GI/Hepatic negative GI ROS, Neg liver ROS,   Endo/Other  negative endocrine ROS  Renal/GU negative Renal ROS  negative genitourinary   Musculoskeletal negative musculoskeletal ROS (+)   Abdominal Normal abdominal exam  (+)   Peds  Hematology negative hematology ROS (+)   Anesthesia Other Findings   Reproductive/Obstetrics                             Anesthesia Physical Anesthesia Plan  ASA: II  Anesthesia Plan: General   Post-op Pain Management:    Induction:   PONV Risk Score and Plan: 4 or greater and Ondansetron, Dexamethasone, Midazolam, Scopolamine patch - Pre-op, Propofol infusion and Treatment may vary due to age or medical condition  Airway Management Planned: LMA  Additional Equipment:   Intra-op Plan:   Post-operative Plan:   Informed Consent: I have reviewed the patients History and Physical, chart, labs and discussed the procedure including the risks, benefits and alternatives for the proposed anesthesia with the patient or authorized representative who has indicated his/her understanding and acceptance.     Plan Discussed with: CRNA and Surgeon  Anesthesia Plan Comments:         Anesthesia Quick Evaluation

## 2016-10-27 NOTE — Anesthesia Postprocedure Evaluation (Signed)
Anesthesia Post Note  Patient: Lori Ross  Procedure(s) Performed: Procedure(s) (LRB): RIGHT BREAST LUMPECTOMY WITH RADIOACTIVE SEED LOCALIZATION (Right)     Patient location during evaluation: PACU Anesthesia Type: General Level of consciousness: awake Pain management: pain level controlled Vital Signs Assessment: post-procedure vital signs reviewed and stable Respiratory status: spontaneous breathing Cardiovascular status: stable Postop Assessment: no signs of nausea or vomiting Anesthetic complications: no    Last Vitals:  Vitals:   10/27/16 0900 10/27/16 0918  BP: 108/72 122/75  Pulse: 90 89  Resp: 12 18  Temp:  36.8 C  SpO2: 95% 98%    Last Pain:  Vitals:   10/27/16 0918  TempSrc:   PainSc: 2    Pain Goal: Patients Stated Pain Goal: 3 (10/27/16 0655)               Kiara Keep JR,JOHN Mateo Flow

## 2016-10-28 ENCOUNTER — Encounter (HOSPITAL_BASED_OUTPATIENT_CLINIC_OR_DEPARTMENT_OTHER): Payer: Self-pay | Admitting: General Surgery

## 2016-10-28 NOTE — Progress Notes (Signed)
Inform patient of Pathology report,.  Tell her that her breast lumpectomy pathology shows no cancer.  Only intraductal papilloma and fibrocystic changes.  Obviously this is excellent news  I will discuss this with her in detail at her next office visit.Let me know that you reached her  hmi

## 2016-12-04 NOTE — Telephone Encounter (Signed)
90 d supply sent to pharmacy

## 2016-12-08 DIAGNOSIS — D3132 Benign neoplasm of left choroid: Secondary | ICD-10-CM | POA: Diagnosis not present

## 2016-12-08 DIAGNOSIS — H25813 Combined forms of age-related cataract, bilateral: Secondary | ICD-10-CM | POA: Diagnosis not present

## 2016-12-08 DIAGNOSIS — H43813 Vitreous degeneration, bilateral: Secondary | ICD-10-CM | POA: Diagnosis not present

## 2016-12-08 DIAGNOSIS — H1851 Endothelial corneal dystrophy: Secondary | ICD-10-CM | POA: Diagnosis not present

## 2016-12-10 ENCOUNTER — Ambulatory Visit (INDEPENDENT_AMBULATORY_CARE_PROVIDER_SITE_OTHER): Payer: Medicare Other

## 2016-12-10 DIAGNOSIS — Z23 Encounter for immunization: Secondary | ICD-10-CM

## 2016-12-15 ENCOUNTER — Other Ambulatory Visit: Payer: Medicare Other | Admitting: Nurse Practitioner

## 2017-02-09 ENCOUNTER — Ambulatory Visit (INDEPENDENT_AMBULATORY_CARE_PROVIDER_SITE_OTHER): Payer: Medicare Other | Admitting: Nurse Practitioner

## 2017-02-09 ENCOUNTER — Encounter: Payer: Self-pay | Admitting: Nurse Practitioner

## 2017-02-09 ENCOUNTER — Ambulatory Visit (INDEPENDENT_AMBULATORY_CARE_PROVIDER_SITE_OTHER): Payer: Medicare Other

## 2017-02-09 VITALS — BP 122/86 | HR 73 | Temp 97.7°F | Ht 64.0 in | Wt 146.0 lb

## 2017-02-09 DIAGNOSIS — Z0000 Encounter for general adult medical examination without abnormal findings: Secondary | ICD-10-CM | POA: Diagnosis not present

## 2017-02-09 DIAGNOSIS — E559 Vitamin D deficiency, unspecified: Secondary | ICD-10-CM | POA: Diagnosis not present

## 2017-02-09 DIAGNOSIS — E782 Mixed hyperlipidemia: Secondary | ICD-10-CM

## 2017-02-09 DIAGNOSIS — R609 Edema, unspecified: Secondary | ICD-10-CM

## 2017-02-09 DIAGNOSIS — Z6826 Body mass index (BMI) 26.0-26.9, adult: Secondary | ICD-10-CM

## 2017-02-09 DIAGNOSIS — Z01419 Encounter for gynecological examination (general) (routine) without abnormal findings: Secondary | ICD-10-CM

## 2017-02-09 DIAGNOSIS — M8580 Other specified disorders of bone density and structure, unspecified site: Secondary | ICD-10-CM | POA: Diagnosis not present

## 2017-02-09 DIAGNOSIS — J410 Simple chronic bronchitis: Secondary | ICD-10-CM | POA: Diagnosis not present

## 2017-02-09 NOTE — Patient Instructions (Signed)

## 2017-02-09 NOTE — Progress Notes (Signed)
Subjective:    Patient ID: Lori Ross, female    DOB: [birth year], 66 y.o.   MRN: [id number]  HPI  Lori Ross is here today for follow up of chronic medical problem.  Outpatient Encounter Medications as of 02/09/2017  Medication Sig  . atorvastatin (LIPITOR) 40 MG tablet Take 1 tablet (40 mg total) by mouth daily.  . calcium-vitamin D (OSCAL WITH D) 500-200 MG-UNIT per tablet Take 1 tablet by mouth daily.  . Cholecalciferol (VITAMIN D3) 2000 UNITS TABS Take 1 tablet by mouth daily.    . fenofibrate 160 MG tablet Take 1 tablet (160 mg total) by mouth daily.  . fish oil-omega-3 fatty acids 1000 MG capsule Take 2 g by mouth daily.  . furosemide (LASIX) 20 MG tablet 1/2 - 1 po qd prn  . HYDROcodone-acetaminophen (NORCO) 5-325 MG tablet Take 1-2 tablets by mouth every 6 (six) hours as needed for moderate pain or severe pain.    1. Annual physical exam   2. Gynecologic exam normal   3. Osteopenia, unspecified location  Deneis any back pain. Does some weight bearing exercises  4. Mixed hyperlipidemia  Tries to watch diet  5. Vitamin D deficiency  tries to take vitamin d daily. Some days she forgets to take  6. BMI 26.0-26.9,adult  No recent weight changes  7. Peripheral edema  Lasix works well to keep swelling under control when she needs sit. She does not take daily    New complaints: None today  Social history: retired    Review of Systems  Constitutional: Negative for activity change and appetite change.  HENT: Negative.   Eyes: Negative for pain.  Respiratory: Negative for shortness of breath.   Cardiovascular: Negative for chest pain, palpitations and leg swelling.  Gastrointestinal: Negative for abdominal pain.  Endocrine: Negative for polydipsia.  Genitourinary: Negative.   Skin: Negative for rash.  Neurological: Negative for dizziness, weakness and headaches.  Hematological: Does not bruise/bleed easily.  Psychiatric/Behavioral: Negative.   All other  systems reviewed and are negative.      Objective:   Physical Exam  Constitutional: She is oriented to person, place, and time. She appears well-developed and well-nourished.  HENT:  Head: Normocephalic.  Right Ear: Hearing, tympanic membrane, external ear and ear canal normal.  Left Ear: Hearing, tympanic membrane, external ear and ear canal normal.  Nose: Nose normal.  Mouth/Throat: Uvula is midline and oropharynx is clear and moist.  Eyes: Conjunctivae and EOM are normal. Pupils are equal, round, and reactive to light.  Neck: Normal range of motion and full passive range of motion without pain. Neck supple. No JVD present. Carotid bruit is not present. No thyroid mass and no thyromegaly present.  Cardiovascular: Normal rate, normal heart sounds and intact distal pulses.  No murmur heard. Pulmonary/Chest: Effort normal and breath sounds normal. Right breast exhibits no inverted nipple, no mass, no nipple discharge, no skin change and no tenderness. Left breast exhibits no inverted nipple, no mass, no nipple discharge, no skin change and no tenderness.    Scar around right aerola from previous breast bx  Abdominal: Soft. Bowel sounds are normal. She exhibits no mass. There is no tenderness.  Genitourinary: Vagina normal and uterus normal. No breast swelling, tenderness, discharge or bleeding. No vaginal discharge found.  Genitourinary Comments: bimanual exam-No adnexal masses or tenderness. cervix parous and pink  Musculoskeletal: Normal range of motion.  Lymphadenopathy:    She has no cervical adenopathy.  Neurological: She is alert  and oriented to person, place, and time.  Skin: Skin is warm and dry.  Psychiatric: She has a normal mood and affect. Her behavior is normal. Judgment and thought content normal.   BP 122/86   Pulse 73   Temp 97.7 F (36.5 C) (Oral)   Ht _0  (1.626 m)   Wt 146 lb (66.2 kg)   BMI 25.06 kg/m   Chest xray- no cardio pulmonary disease-Preliminary  reading by Ronnald Collum, FNP  [facility]   EKG- Kerry Hough, FNP      Assessment & Plan:  1. Annual physical exam - CBC with Differential/Platelet - Thyroid Panel With TSH  2. Gynecologic exam normal - IGP, Aptima HPV, rfx 16/18,45  3. Osteopenia, unspecified location Weight bearing exercises encouraged  4. Mixed hyperlipidemia Low fat diet - CMP14+EGFR - Lipid panel - DG Chest 2 View; Future - EKG 12-Lead  5. Vitamin D deficiency - VITAMIN D 25 Hydroxy (Vit-D Deficiency, Fractures)  6. BMI 26.0-26.9,adult Discussed diet and exercise for person with BMI >25 Will recheck weight in 3-6 months  7. Peripheral edema take lasix on prn basis    Labs pending Health maintenance reviewed Diet and exercise encouraged Continue all meds Follow up  In 6 months   [place], FNP

## 2017-02-10 LAB — CMP14+EGFR
ALK PHOS: 36 IU/L — AB (ref 39–117)
ALT: 17 IU/L (ref 0–32)
AST: 21 IU/L (ref 0–40)
Albumin/Globulin Ratio: 1.6 (ref 1.2–2.2)
Albumin: 4.5 g/dL (ref 3.6–4.8)
BILIRUBIN TOTAL: 0.4 mg/dL (ref 0.0–1.2)
BUN/Creatinine Ratio: 22 (ref 12–28)
BUN: 21 mg/dL (ref 8–27)
CHLORIDE: 103 mmol/L (ref 96–106)
CO2: 25 mmol/L (ref 20–29)
Calcium: 9.9 mg/dL (ref 8.7–10.3)
Creatinine, Ser: 0.95 mg/dL (ref 0.57–1.00)
GFR calc Af Amer: 73 mL/min/{1.73_m2} (ref >59)
GFR calc non Af Amer: 63 mL/min/{1.73_m2} (ref >59)
GLUCOSE: 84 mg/dL (ref 65–99)
Globulin, Total: 2.8 g/dL (ref 1.5–4.5)
Potassium: 4.5 mmol/L (ref 3.5–5.2)
Sodium: 142 mmol/L (ref 134–144)
TOTAL PROTEIN: 7.3 g/dL (ref 6.0–8.5)

## 2017-02-10 LAB — CBC WITH DIFFERENTIAL/PLATELET
BASOS: 0 %
Basophils Absolute: 0 10*3/uL (ref 0.0–0.2)
EOS (ABSOLUTE): 0.1 10*3/uL (ref 0.0–0.4)
EOS: 3 %
HEMATOCRIT: 39.8 % (ref 34.0–46.6)
Hemoglobin: 13.2 g/dL (ref 11.1–15.9)
IMMATURE GRANULOCYTES: 0 %
Immature Grans (Abs): 0 10*3/uL (ref 0.0–0.1)
LYMPHS ABS: 1.7 10*3/uL (ref 0.7–3.1)
Lymphs: 37 %
MCH: 28.7 pg (ref 26.6–33.0)
MCHC: 33.2 g/dL (ref 31.5–35.7)
MCV: 87 fL (ref 79–97)
MONOS ABS: 0.4 10*3/uL (ref 0.1–0.9)
Monocytes: 9 %
Neutrophils Absolute: 2.3 10*3/uL (ref 1.4–7.0)
Neutrophils: 51 %
PLATELETS: 376 10*3/uL (ref 150–379)
RBC: 4.6 x10E6/uL (ref 3.77–5.28)
RDW: 14.4 % (ref 12.3–15.4)
WBC: 4.6 10*3/uL (ref 3.4–10.8)

## 2017-02-10 LAB — LIPID PANEL
CHOLESTEROL TOTAL: 134 mg/dL (ref 100–199)
Chol/HDL Ratio: 2.7 ratio (ref 0.0–4.4)
HDL: 49 mg/dL (ref >39)
LDL Calculated: 68 mg/dL (ref 0–99)
Triglycerides: 86 mg/dL (ref 0–149)
VLDL CHOLESTEROL CAL: 17 mg/dL (ref 5–40)

## 2017-02-10 LAB — THYROID PANEL WITH TSH
FREE THYROXINE INDEX: 2 (ref 1.2–4.9)
T3 UPTAKE RATIO: 26 % (ref 24–39)
T4, Total: 7.5 ug/dL (ref 4.5–12.0)
TSH: 1.67 u[IU]/mL (ref 0.450–4.500)

## 2017-02-10 LAB — VITAMIN D 25 HYDROXY (VIT D DEFICIENCY, FRACTURES): Vit D, 25-Hydroxy: 28.8 ng/mL — ABNORMAL LOW (ref 30.0–100.0)

## 2017-02-11 LAB — IGP, APTIMA HPV, RFX 16/18,45
HPV APTIMA: NEGATIVE
PAP Smear Comment: 0

## 2017-03-12 ENCOUNTER — Encounter: Payer: Self-pay | Admitting: *Deleted

## 2017-04-19 ENCOUNTER — Telehealth: Payer: Self-pay | Admitting: Nurse Practitioner

## 2017-04-19 ENCOUNTER — Encounter: Payer: Self-pay | Admitting: Nurse Practitioner

## 2017-04-19 NOTE — Telephone Encounter (Signed)
Ms. Whitcher states that she needs a letter stating the reason for the pap/HPV test she had on 02/09/17. [facility] has paid for the pap test but denied the HPV test. She is issuing an appeal to [facility] and needs a letter from you to support. Please call her at her home or mobile number if you have any questions.

## 2017-06-23 ENCOUNTER — Other Ambulatory Visit: Payer: Self-pay | Admitting: Nurse Practitioner

## 2017-06-23 DIAGNOSIS — E782 Mixed hyperlipidemia: Secondary | ICD-10-CM

## 2017-06-23 MED ORDER — ATORVASTATIN CALCIUM 40 MG PO TABS: 40.0000 mg | ORAL_TABLET | Freq: Every day | ORAL | 0 refills | Status: DC

## 2017-06-23 MED ORDER — FENOFIBRATE 160 MG PO TABS: 160.0000 mg | ORAL_TABLET | Freq: Every day | ORAL | 0 refills | Status: DC

## 2017-06-23 NOTE — Telephone Encounter (Signed)
What is the name of the medication? Fenofibrate 160 mg, Atorvastatin 40 mg  Have you contacted your pharmacy to request a refill? Yes  Which pharmacy would you like this sent to? [facility]   Patient notified that their request is being sent to the clinical staff for review and that they should receive a call once it is complete. If they do not receive a call within 24 hours they can check with their pharmacy or our office.

## 2017-06-23 NOTE — Addendum Note (Signed)
Addended by: Antonietta Barcelona D on: 06/23/2017 04:58 PM   Modules accepted: Orders

## 2017-08-06 ENCOUNTER — Encounter: Payer: Self-pay | Admitting: Nurse Practitioner

## 2017-08-06 ENCOUNTER — Ambulatory Visit (INDEPENDENT_AMBULATORY_CARE_PROVIDER_SITE_OTHER): Payer: Medicare Other | Admitting: Nurse Practitioner

## 2017-08-06 VITALS — BP 131/85 | HR 70 | Temp 98.2°F | Ht 64.0 in | Wt 153.0 lb

## 2017-08-06 DIAGNOSIS — R609 Edema, unspecified: Secondary | ICD-10-CM

## 2017-08-06 DIAGNOSIS — E559 Vitamin D deficiency, unspecified: Secondary | ICD-10-CM | POA: Diagnosis not present

## 2017-08-06 DIAGNOSIS — E782 Mixed hyperlipidemia: Secondary | ICD-10-CM

## 2017-08-06 DIAGNOSIS — Z87898 Personal history of other specified conditions: Secondary | ICD-10-CM

## 2017-08-06 DIAGNOSIS — Z6826 Body mass index (BMI) 26.0-26.9, adult: Secondary | ICD-10-CM | POA: Diagnosis not present

## 2017-08-06 DIAGNOSIS — M8580 Other specified disorders of bone density and structure, unspecified site: Secondary | ICD-10-CM | POA: Diagnosis not present

## 2017-08-06 DIAGNOSIS — Z23 Encounter for immunization: Secondary | ICD-10-CM | POA: Diagnosis not present

## 2017-08-06 MED ORDER — ATORVASTATIN CALCIUM 40 MG PO TABS: 40.0000 mg | ORAL_TABLET | Freq: Every day | ORAL | 0 refills | Status: DC

## 2017-08-06 MED ORDER — FENOFIBRATE 160 MG PO TABS: 160.0000 mg | ORAL_TABLET | Freq: Every day | ORAL | 0 refills | Status: DC

## 2017-08-06 NOTE — Addendum Note (Signed)
Addended by: Rolena Infante on: 08/06/2017 10:33 AM   Modules accepted: Orders

## 2017-08-06 NOTE — Patient Instructions (Signed)

## 2017-08-06 NOTE — Progress Notes (Signed)
Subjective:    Patient ID: Lori Ross, female    DOB: May 22, 1950, 67 y.o.   MRN: [id number]   Chief Complaint: Medicam Management of Chronic Issues HPI:  1. Osteopenia, unspecified location  Last dexascan was done 10/16/15 with t score of -2.7. Does do weight bearing exercises  2. Vitamin D deficiency  Takes daily supplement  3. Peripheral edema  Has daily edema but resolves during the night  4. Mixed hyperlipidemia  She does watch diet- avoids fried foods. Not exercising as much as she use to.  5. BMI 26.0-26.9,adult  Weight up 7 lbs    Outpatient Encounter Medications as of 08/06/2017  Medication Sig  . atorvastatin (LIPITOR) 40 MG tablet Take 1 tablet (40 mg total) by mouth daily.  . calcium-vitamin D (OSCAL WITH D) 500-200 MG-UNIT per tablet Take 1 tablet by mouth daily.  . Cholecalciferol (VITAMIN D3) 2000 UNITS TABS Take 1 tablet by mouth daily.    . fenofibrate 160 MG tablet Take 1 tablet (160 mg total) by mouth daily.  . fish oil-omega-3 fatty acids 1000 MG capsule Take 2 g by mouth daily.  . furosemide (LASIX) 20 MG tablet 1/2 - 1 po qd prn  . HYDROcodone-acetaminophen (NORCO) 5-325 MG tablet Take 1-2 tablets by mouth every 6 (six) hours as needed for moderate pain or severe pain.      New complaints: None today  Social history: Lives by herself- has friends and family that she sees often.   Review of Systems  Constitutional: Negative for activity change and appetite change.  HENT: Negative.   Eyes: Negative for pain.  Respiratory: Negative for shortness of breath.   Cardiovascular: Negative for chest pain, palpitations and leg swelling.  Gastrointestinal: Negative for abdominal pain.  Endocrine: Negative for polydipsia.  Genitourinary: Negative.   Skin: Negative for rash.  Neurological: Negative for dizziness, weakness and headaches.  Hematological: Does not bruise/bleed easily.  Psychiatric/Behavioral: Negative.   All other systems reviewed and are  negative.      Objective:   Physical Exam  Constitutional: She is oriented to person, place, and time. She appears well-developed and well-nourished. No distress.  HENT:  Head: Normocephalic.  Nose: Nose normal.  Mouth/Throat: Oropharynx is clear and moist.  Eyes: Pupils are equal, round, and reactive to light. EOM are normal.  Neck: Normal range of motion. Neck supple. No JVD present. Carotid bruit is not present.  Cardiovascular: Normal rate, regular rhythm, normal heart sounds and intact distal pulses.  Pulmonary/Chest: Effort normal and breath sounds normal. No respiratory distress. She has no wheezes. She has no rales. She exhibits no tenderness.  Abdominal: Soft. Normal appearance, normal aorta and bowel sounds are normal. She exhibits no distension, no abdominal bruit, no pulsatile midline mass and no mass. There is no splenomegaly or hepatomegaly. There is no tenderness.  Musculoskeletal: Normal range of motion. She exhibits no edema.  Lymphadenopathy:    She has no cervical adenopathy.  Neurological: She is alert and oriented to person, place, and time. She has normal reflexes.  Skin: Skin is warm and dry.  Psychiatric: She has a normal mood and affect. Her behavior is normal. Judgment and thought content normal.   BP 131/85   Pulse 70   Temp 98.2 F (36.8 C) (Oral)   Ht _0  (1.626 m)   Wt 153 lb (69.4 kg)   BMI 26.26 kg/m         Assessment & Plan:  Lori Ross comes in  today with chief complaint of Medical Management of Chronic Issues   Diagnosis and orders addressed:  1. Mixed hyperlipidemia Low fat diet - atorvastatin (LIPITOR) 40 MG tablet; Take 1 tablet (40 mg total) by mouth daily.  Dispense: 90 tablet; Refill: 0 - fenofibrate 160 MG tablet; Take 1 tablet (160 mg total) by mouth daily.  Dispense: 90 tablet; Refill: 0 - CMP14+EGFR - Lipid panel  2. Osteopenia, unspecified location Weight bearing exercise encouraged  3. Vitamin D  deficiency Continue daily vitamin d supplement  4. Peripheral edema Elevate legs when sitting  5. BMI 26.0-26.9,adult Discussed diet and exercise for person with BMI >25 Will recheck weight in 3-6 months  6. H/O abnormal mammogram - MM 3D SCREEN BREAST BILATERAL; Future   Labs pending Health Maintenance reviewed Diet and exercise encouraged  Follow up plan: 6 months   [name], FNP

## 2017-08-07 LAB — CMP14+EGFR
ALBUMIN: 4.5 g/dL (ref 3.6–4.8)
ALT: 22 IU/L (ref 0–32)
AST: 23 IU/L (ref 0–40)
Albumin/Globulin Ratio: 1.7 (ref 1.2–2.2)
Alkaline Phosphatase: 33 IU/L — ABNORMAL LOW (ref 39–117)
BUN / CREAT RATIO: 25 (ref 12–28)
BUN: 23 mg/dL (ref 8–27)
Bilirubin Total: 0.4 mg/dL (ref 0.0–1.2)
CALCIUM: 9.7 mg/dL (ref 8.7–10.3)
CO2: 22 mmol/L (ref 20–29)
CREATININE: 0.92 mg/dL (ref 0.57–1.00)
Chloride: 107 mmol/L — ABNORMAL HIGH (ref 96–106)
GFR, EST AFRICAN AMERICAN: 75 mL/min/{1.73_m2} (ref >59)
GFR, EST NON AFRICAN AMERICAN: 65 mL/min/{1.73_m2} (ref >59)
GLUCOSE: 86 mg/dL (ref 65–99)
Globulin, Total: 2.7 g/dL (ref 1.5–4.5)
Potassium: 4.5 mmol/L (ref 3.5–5.2)
Sodium: 143 mmol/L (ref 134–144)
TOTAL PROTEIN: 7.2 g/dL (ref 6.0–8.5)

## 2017-08-07 LAB — LIPID PANEL
CHOL/HDL RATIO: 3.2 ratio (ref 0.0–4.4)
Cholesterol, Total: 123 mg/dL (ref 100–199)
HDL: 39 mg/dL — AB (ref >39)
LDL CALC: 68 mg/dL (ref 0–99)
Triglycerides: 80 mg/dL (ref 0–149)
VLDL CHOLESTEROL CAL: 16 mg/dL (ref 5–40)

## 2017-08-12 ENCOUNTER — Ambulatory Visit: Payer: Medicare Other | Admitting: Nurse Practitioner

## 2017-08-16 ENCOUNTER — Telehealth: Payer: Self-pay | Admitting: Nurse Practitioner

## 2017-08-16 DIAGNOSIS — R928 Other abnormal and inconclusive findings on diagnostic imaging of breast: Secondary | ICD-10-CM

## 2017-08-17 NOTE — Telephone Encounter (Signed)
diag mammo ordered.

## 2017-08-26 ENCOUNTER — Other Ambulatory Visit: Payer: Self-pay | Admitting: Nurse Practitioner

## 2017-08-26 DIAGNOSIS — Z1231 Encounter for screening mammogram for malignant neoplasm of breast: Secondary | ICD-10-CM

## 2017-09-02 ENCOUNTER — Encounter (HOSPITAL_COMMUNITY): Payer: Self-pay

## 2017-09-02 ENCOUNTER — Ambulatory Visit (HOSPITAL_COMMUNITY)
Admission: RE | Admit: 2017-09-02 | Discharge: 2017-09-02 | Disposition: A | Discharge status: Home / Self Care | Payer: Medicare Other | Source: Ambulatory Visit | Attending: Nurse Practitioner | Admitting: Nurse Practitioner

## 2017-09-02 DIAGNOSIS — Z1231 Encounter for screening mammogram for malignant neoplasm of breast: Secondary | ICD-10-CM | POA: Diagnosis not present

## 2017-10-05 ENCOUNTER — Encounter: Payer: Self-pay | Admitting: *Deleted

## 2017-10-05 ENCOUNTER — Ambulatory Visit (INDEPENDENT_AMBULATORY_CARE_PROVIDER_SITE_OTHER): Payer: Medicare Other | Admitting: *Deleted

## 2017-10-05 VITALS — BP 118/76 | HR 75 | Ht 64.0 in | Wt 154.0 lb

## 2017-10-05 DIAGNOSIS — Z0000 Encounter for general adult medical examination without abnormal findings: Secondary | ICD-10-CM

## 2017-10-05 NOTE — Patient Instructions (Signed)
  Lori Ross , Thank you for taking time to come for your Medicare Wellness Visit. I appreciate your ongoing commitment to your health goals. Please review the following plan we discussed and let me know if I can assist you in the future.   These are the goals we discussed: Goals    . Exercise 150 min/wk Moderate Activity       This is a list of the screening recommended for you and due dates:  Health Maintenance  Topic Date Due  . Flu Shot  10/07/2017  . DEXA scan (bone density measurement)  10/15/2017  . Mammogram  09/03/2018  . Colon Cancer Screening  02/06/2019  . Tetanus Vaccine  08/17/2019  .  Hepatitis C: One time screening is recommended by Center for Disease Control  (CDC) for  adults born from [birth year] through 1964.   Completed  . Pneumonia vaccines  Completed

## 2017-10-05 NOTE — Progress Notes (Addendum)
Subjective:   Lori Ross is a 67 y.o. female who presents for a Medicare Annual Wellness Visit. Lori Ross lives at home alone since her husband died of colon cancer in 17-Jun-2001. She has one adult son and one adult daughter and two granddaughters. [place] and has cats and dogs. She used to walk for exercise but is afraid now because of the increase in coyotes and bears in the area. She has considered going to the park to walk. She enjoys reading as a hobby and is a retired Counselling psychologist.  She meets with a group that she worked with monthly for lunch and she has another group that gets together a couple of times a month to eat.    Review of Systems    Patient reports that her overall health is unchanged compared to last year.  Cardiac Risk Factors include: advanced age (>73men, >63 women);dyslipidemia;sedentary lifestyle   All other systems negative       Current Medications (verified) Outpatient Encounter Medications as of 10/05/2017  Medication Sig  . atorvastatin (LIPITOR) 40 MG tablet Take 1 tablet (40 mg total) by mouth daily.  . calcium-vitamin D (OSCAL WITH D) 500-200 MG-UNIT per tablet Take 1 tablet by mouth daily.  . Cholecalciferol (VITAMIN D3) 2000 UNITS TABS Take 1 tablet by mouth daily.    . fenofibrate 160 MG tablet Take 1 tablet (160 mg total) by mouth daily.  . fish oil-omega-3 fatty acids 1000 MG capsule Take 2 g by mouth daily.   No facility-administered encounter medications on file as of 10/05/2017.     Allergies (verified) Patient has no known allergies.   History: Past Medical History:  Diagnosis Date  . Breast mass, right   . Breast mass, right 10/27/2016  . Hyperlipidemia   . Osteoporosis   . Vitamin D insufficiency    Past Surgical History:  Procedure Laterality Date  . BREAST EXCISIONAL BIOPSY Right    papiloma  . BREAST LUMPECTOMY WITH RADIOACTIVE SEED LOCALIZATION Right 10/27/2016   Procedure: RIGHT BREAST LUMPECTOMY WITH  RADIOACTIVE SEED LOCALIZATION;  Surgeon: Fanny Skates, MD;  Location: [place];  Service: General;  Laterality: Right;  . COLONOSCOPY     Family History  Problem Relation Age of Onset  . Stroke Father 97  . Hypertension Sister   . Stroke Sister 18  . Healthy Daughter   . Healthy Son    Social History   Socioeconomic History  . Marital status: Widowed    Spouse name: Not on file  . Number of children: 2  . Years of education: 68  . Highest education level: Associate degree: academic program  Occupational History  . 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 Financial resource strain: Not hard at all  . Food insecurity:    Worry: Never true    Inability: Never true  . Transportation needs:    Medical: No    Non-medical: No  Tobacco Use  . Smoking status: Never Smoker  . Smokeless tobacco: Never Used  Substance and Sexual Activity  . Alcohol use: No  . Drug use: No  . Sexual activity: Not on file  Lifestyle  . Physical activity:    Days per week: 0 days    Minutes per session: 0 min  . Stress: To some extent  Relationships  . Social connections:    Talks on phone: More than three times a week  Gets together: More than three times a week    Attends religious service: More than 4 times per year    Active member of club or organization: Yes    Attends meetings of clubs or organizations: More than 4 times per year    Relationship status: Widowed  Other Topics Concern  . Not on file  Social History Narrative  . Not on file    Tobacco Use No.  Clinical Intake:     Pain : No/denies pain     Nutritional Status: BMI 25 -29 Overweight Diabetes: No  How often do you need to have someone help you when you read instructions, pamphlets, or other written materials from your doctor or pharmacy?: 1 - Never What is the last grade level you completed in school?: 2 years of college  Interpreter Needed?:  No  Information entered by :: Chong Sicilian, RN   Activities of Daily Living In your present state of health, do you have any difficulty performing the following activities: 10/05/2017 10/27/2016  Hearing? N N  Vision? N N  Comment has yearly eye exams -  Difficulty concentrating or making decisions? N N  Comment sometimes forgets where she puts things -  Walking or climbing stairs? N N  Dressing or bathing? N N  Doing errands, shopping? N -  Preparing Food and eating ? N -  Using the Toilet? N -  In the past six months, have you accidently leaked urine? N -  Do you have problems with loss of bowel control? N -  Managing your Medications? N -  Managing your Finances? N -  Housekeeping or managing your Housekeeping? N -  Some recent data might be hidden     Diet Eats frozen meals and sandwiches mostly Drinks water and unsweetened tea. Does not drink artifical sweetner  Exercise Current Exercise Habits: The patient does not participate in regular exercise at present, Exercise limited by: None identified   Depression Screen PHQ 2/9 Scores 10/05/2017 08/06/2017 02/09/2017 10/13/2016 07/06/2016 03/30/2016 09/25/2015  PHQ - 2 Score 0 0 0 0 0 0 0     Fall Risk Fall Risk  10/05/2017 08/06/2017 02/09/2017 10/13/2016 07/06/2016  Falls in the past year? No No No No No    Safety Is the patient's home free of loose throw rugs in walkways, pet beds, electrical cords, etc?   yes      Grab bars in the bathroom? yes      Walkin shower? yes      Shower Seat? no      Handrails on the stairs?   yes      Adequate lighting?   yes  Patient Care Team: Chevis Pretty, FNP as PCP - General (Nurse Practitioner)  Hospitalizations, surgeries, and ER visits in previous 12 months Breast lumpectomy 10/2016. No other surgeries, hospitalizations, or ER visits  Objective:    Today's Vitals   10/05/17 0840  BP: 118/76  Pulse: 75  Weight: 154 lb (69.9 kg)  Height: 5\' 4"  (1.626 m)   Body mass index  is 26.43 kg/m.  Advanced Directives 10/05/2017 10/27/2016 10/19/2016 09/16/2016  Does Patient Have a Medical Advance Directive? Yes Yes Yes Yes  Type of Advance Directive Living will Living will - Living will  Does patient want to make changes to medical advance directive? No - Patient declined No - Patient declined - -  Copy of [place] in Chart? - - No - copy requested -    Hearing/Vision  normal or No deficits noted during visit.  Cognitive Function: MMSE - Mini Mental State Exam 10/05/2017  Orientation to time 5  Orientation to Place 5  Registration 3  Attention/ Calculation 5  Recall 3  Language- name 2 objects 2  Language- repeat 1  Language- follow 3 step command 3  Language- read & follow direction 1  Write a sentence 1  Copy design 1  Total score 30       Normal Cognitive Function Screening: Yes    Immunizations and Health Maintenance Immunization History  Administered Date(s) Administered  . Influenza Split 12/29/2012  . Influenza, High Dose Seasonal PF 12/10/2016  . Influenza,inj,Quad PF,6+ Mos 12/12/2013, 12/14/2014  . Influenza-Unspecified 12/30/2015  . Pneumococcal Conjugate-13 03/30/2016  . Pneumococcal Polysaccharide-23 08/06/2017  . Td 08/16/2009  . Zoster 05/17/2013   There are no preventive care reminders to display for this patient. Health Maintenance  Topic Date Due  . INFLUENZA VACCINE  10/07/2017  . DEXA SCAN  10/15/2017  . MAMMOGRAM  09/03/2018  . COLONOSCOPY  02/06/2019  . TETANUS/TDAP  08/17/2019  . Hepatitis C Screening  Completed  . PNA vac Low Risk Adult  Completed        Assessment:   This is a routine wellness examination for [place].    Plan:    Goals    . Exercise 150 min/wk Moderate Activity        Health Maintenance Recommendations: Bone densitometry screening Colonoscopy repeat in 2020-Dr Mann  Additional Screening Recommendations: Lung: Low Dose CT Chest recommended if Age 39-80 years, 30  pack-year currently smoking OR have quit w/in 15years. Patient does not qualify. Hepatitis C Screening recommended: no  Today's Orders No orders of the defined types were placed in this encounter.   Keep f/u with Chevis Pretty, FNP and any other specialty appointments you may have Continue current medications Move carefully to avoid falls.  Aim for at least 150 minutes of moderate activity a week. Read or work on puzzles daily Stay connected with friends and family  Requested colonoscopy report from Dr Collene Mares for 2015  I have personally reviewed and noted the following in the patient's chart:   . Medical and social history . Use of alcohol, tobacco or illicit drugs  . Current medications and supplements . Functional ability and status . Nutritional status . Physical activity . Advanced directives . List of other physicians . Hospitalizations, surgeries, and ER visits in previous 12 months . Vitals . Screenings to include cognitive, depression, and falls . Referrals and appointments  In addition, I have reviewed and discussed with patient certain preventive protocols, quality metrics, and best practice recommendations. A written personalized care plan for preventive services as well as general preventive health recommendations were provided to patient.     Chong Sicilian, RN   10/05/2017   I have reviewed and agree with the above AWV documentation.   [name], FNP

## 2017-12-06 ENCOUNTER — Ambulatory Visit (INDEPENDENT_AMBULATORY_CARE_PROVIDER_SITE_OTHER): Payer: Medicare Other

## 2017-12-06 DIAGNOSIS — Z23 Encounter for immunization: Secondary | ICD-10-CM | POA: Diagnosis not present

## 2017-12-16 DIAGNOSIS — H25813 Combined forms of age-related cataract, bilateral: Secondary | ICD-10-CM | POA: Diagnosis not present

## 2017-12-16 DIAGNOSIS — H43813 Vitreous degeneration, bilateral: Secondary | ICD-10-CM | POA: Diagnosis not present

## 2017-12-16 DIAGNOSIS — H1851 Endothelial corneal dystrophy: Secondary | ICD-10-CM | POA: Diagnosis not present

## 2017-12-16 DIAGNOSIS — D3132 Benign neoplasm of left choroid: Secondary | ICD-10-CM | POA: Diagnosis not present

## 2018-01-12 ENCOUNTER — Other Ambulatory Visit: Payer: Self-pay | Admitting: Nurse Practitioner

## 2018-01-12 DIAGNOSIS — E782 Mixed hyperlipidemia: Secondary | ICD-10-CM

## 2018-02-09 ENCOUNTER — Encounter: Payer: Self-pay | Admitting: Nurse Practitioner

## 2018-02-09 ENCOUNTER — Ambulatory Visit (INDEPENDENT_AMBULATORY_CARE_PROVIDER_SITE_OTHER): Payer: Medicare Other | Admitting: Nurse Practitioner

## 2018-02-09 ENCOUNTER — Ambulatory Visit (INDEPENDENT_AMBULATORY_CARE_PROVIDER_SITE_OTHER): Payer: Medicare Other

## 2018-02-09 VITALS — BP 130/81 | HR 69 | Temp 97.4°F | Ht 64.0 in | Wt 157.0 lb

## 2018-02-09 DIAGNOSIS — E782 Mixed hyperlipidemia: Secondary | ICD-10-CM

## 2018-02-09 DIAGNOSIS — M85852 Other specified disorders of bone density and structure, left thigh: Secondary | ICD-10-CM

## 2018-02-09 DIAGNOSIS — R609 Edema, unspecified: Secondary | ICD-10-CM | POA: Diagnosis not present

## 2018-02-09 DIAGNOSIS — M8588 Other specified disorders of bone density and structure, other site: Secondary | ICD-10-CM

## 2018-02-09 DIAGNOSIS — E559 Vitamin D deficiency, unspecified: Secondary | ICD-10-CM

## 2018-02-09 DIAGNOSIS — N6310 Unspecified lump in the right breast, unspecified quadrant: Secondary | ICD-10-CM | POA: Diagnosis not present

## 2018-02-09 DIAGNOSIS — R600 Localized edema: Secondary | ICD-10-CM

## 2018-02-09 DIAGNOSIS — M85851 Other specified disorders of bone density and structure, right thigh: Secondary | ICD-10-CM

## 2018-02-09 DIAGNOSIS — Z6826 Body mass index (BMI) 26.0-26.9, adult: Secondary | ICD-10-CM | POA: Diagnosis not present

## 2018-02-09 MED ORDER — ATORVASTATIN CALCIUM 40 MG PO TABS: 40.0000 mg | ORAL_TABLET | Freq: Every day | ORAL | 1 refills | Status: DC

## 2018-02-09 MED ORDER — FENOFIBRATE 160 MG PO TABS: 160.0000 mg | ORAL_TABLET | Freq: Every day | ORAL | 1 refills | Status: DC

## 2018-02-09 NOTE — Patient Instructions (Signed)
Breast Self-Awareness Breast self-awareness means:  Knowing how your breasts look.  Knowing how your breasts feel.  Checking your breasts every month for changes.  Telling your doctor if you notice a change in your breasts.  Breast self-awareness allows you to notice a breast problem early while it is still small. How to do a breast self-exam One way to learn what is normal for your breasts and to check for changes is to do a breast self-exam. To do a breast self-exam: Look for Changes  1. Take off all the clothes above your waist. 2. Stand in front of a mirror in a room with good lighting. 3. Put your hands on your hips. 4. Push your hands down. 5. Look at your breasts and nipples in the mirror to see if one breast or nipple looks different than the other. Check to see if: ? The shape of one breast is different. ? The size of one breast is different. ? There are wrinkles, dips, and bumps in one breast and not the other. 6. Look at each breast for changes in your skin, such as: ? Redness. ? Scaly areas. 7. Look for changes in your nipples, such as: ? Liquid around the nipples. ? Bleeding. ? Dimpling. ? Redness. ? A change in where the nipples are. Feel for Changes 1. Lie on your back on the floor. 2. Feel each breast. To do this, follow these steps: ? Pick a breast to feel. ? Put the arm closest to that breast above your head. ? Use your other arm to feel the nipple area of your breast. Feel the area with the pads of your three middle fingers by making small circles with your fingers. For the first circle, press lightly. For the second circle, press harder. For the third circle, press even harder. ? Keep making circles with your fingers at the light, harder, and even harder pressures as you move down your breast. Stop when you feel your ribs. ? Move your fingers a little toward the center of your body. ? Start making circles with your fingers again, this time going up until  you reach your collarbone. ? Keep making up and down circles until you reach your armpit. Remember to keep using the three pressures. ? Feel the other breast in the same way. 3. Sit or stand in the shower or tub. 4. With soapy water on your skin, feel each breast the same way you did in step 2, when you were lying on the floor. Write Down What You Find  After doing the self-exam, write down:  What is normal for each breast.  Any changes you find in each breast.  When you last had your period.  How often should I check my breasts? Check your breasts every month. If you are breastfeeding, the best time to check them is after you feed your baby or after you use a breast pump. If you get periods, the best time to check your breasts is 5-7 days after your period is over. When should I see my doctor? See your doctor if you notice:  A change in shape or size of your breasts or nipples.  A change in the skin of your breast or nipples, such as red or scaly skin.  Unusual fluid coming from your nipples.  A lump or thick area that was not there before.  Pain in your breasts.  Anything that concerns you.  This information is not intended to replace advice given to   you by your health care provider. Make sure you discuss any questions you have with your health care provider. Document Released: 08/12/2007 Document Revised: 08/01/2015 Document Reviewed: 01/13/2015 Elsevier Interactive Patient Education  2018 [employer]  

## 2018-02-09 NOTE — Progress Notes (Signed)
Subjective:    Patient ID: Lori Ross, female    DOB: 1951-02-07, 67 y.o.   MRN: [id number]   Chief Complaint: medical management of chronic issues  HPI:  1. Osteopenia, unspecified location  Last dexascan was 10/16/15 with tscore of -2.7.  2. BMI 26.0-26.9,adult  No recent weight changes  3. Mixed hyperlipidemia  Watches det but does not do much exercise.  4. Peripheral edema  Does not have everyday  5. Vitamin D deficiency  Takes daily vitamin d supplement  6. Breast mass, right  Had lumpectomy last year and was not malignant. She was told to have a prefessional breast exam yearly along with mammogram.    Outpatient Encounter Medications as of 02/09/2018  Medication Sig  . atorvastatin (LIPITOR) 40 MG tablet Take 1 tablet by mouth daily  . calcium-vitamin D (OSCAL WITH D) 500-200 MG-UNIT per tablet Take 1 tablet by mouth daily.  . Cholecalciferol (VITAMIN D3) 2000 UNITS TABS Take 1 tablet by mouth daily.    . fenofibrate 160 MG tablet Take 1 tablet by mouth daily  . fish oil-omega-3 fatty acids 1000 MG capsule Take 2 g by mouth daily.       New complaints: None today  Social history: Retired from [employer] block   Review of Systems  Constitutional: Negative for activity change and appetite change.  HENT: Negative.   Eyes: Negative for pain.  Respiratory: Negative for shortness of breath.   Cardiovascular: Negative for chest pain, palpitations and leg swelling.  Gastrointestinal: Negative for abdominal pain.  Endocrine: Negative for polydipsia.  Genitourinary: Negative.   Skin: Negative for rash.  Neurological: Negative for dizziness, weakness and headaches.  Hematological: Does not bruise/bleed easily.  Psychiatric/Behavioral: Negative.   All other systems reviewed and are negative.      Objective:   Physical Exam  Constitutional: She is oriented to person, place, and time. She appears well-developed and well-nourished. No distress.  HENT:  Head:  Normocephalic.  Nose: Nose normal.  Mouth/Throat: Oropharynx is clear and moist.  Eyes: Pupils are equal, round, and reactive to light. EOM are normal.  Neck: Normal range of motion. Neck supple. No JVD present. Carotid bruit is not present.  Cardiovascular: Normal rate, regular rhythm, normal heart sounds and intact distal pulses.  Pulmonary/Chest: Effort normal and breath sounds normal. No respiratory distress. She has no wheezes. She has no rales. She exhibits no tenderness. Right breast exhibits no inverted nipple, no mass, no nipple discharge, no skin change and no tenderness. Left breast exhibits no inverted nipple, no mass, no nipple discharge, no skin change and no tenderness. Breasts are asymmetrical (right smaller then left due to mass removal 2 years ago).  Abdominal: Soft. Normal appearance, normal aorta and bowel sounds are normal. She exhibits no distension, no abdominal bruit, no pulsatile midline mass and no mass. There is no splenomegaly or hepatomegaly. There is no tenderness.  Musculoskeletal: Normal range of motion. She exhibits no edema.  Lymphadenopathy:    She has no cervical adenopathy.  Neurological: She is alert and oriented to person, place, and time. She has normal reflexes.  Skin: Skin is warm and dry.  Psychiatric: She has a normal mood and affect. Her behavior is normal. Judgment and thought content normal.  Nursing note and vitals reviewed.  BP 130/81   Pulse 69   Temp (!) 97.4 F (36.3 C) (Oral)   Ht 5' 4" (1.626 m)   Wt 157 lb (71.2 kg)   BMI 26.95 kg/m  Assessment & Plan:  Lori Ross comes in today with chief complaint of Medical Management of Chronic Issues (Wants a breast exam)   Diagnosis and orders addressed:  1. Osteopenia of both hips dexascan results pending - DG [facility] DEXA  2. BMI 26.0-26.9,adult Discussed diet and exercise for person with BMI >25 Will recheck weight in 3-6 months  3. Mixed hyperlipidemia Low fat  diet - CMP14+EGFR - Lipid panel - fenofibrate 160 MG tablet; Take 1 tablet (160 mg total) by mouth daily.  Dispense: 90 tablet; Refill: 1 - atorvastatin (LIPITOR) 40 MG tablet; Take 1 tablet (40 mg total) by mouth daily.  Dispense: 90 tablet; Refill: 1  4. Peripheral edema Elevate legs when sitting  5. Vitamin D deficiency Continue daily vitamn d supplement  6. Breast mass, right Breast exam normal   Labs pending Health Maintenance reviewed Diet and exercise encouraged  Follow up plan: 6 months   Mary-Margaret Martin, FNP  

## 2018-02-10 LAB — LIPID PANEL
CHOLESTEROL TOTAL: 158 mg/dL (ref 100–199)
Chol/HDL Ratio: 3.3 ratio (ref 0.0–4.4)
HDL: 48 mg/dL (ref >39)
LDL Calculated: 86 mg/dL (ref 0–99)
Triglycerides: 118 mg/dL (ref 0–149)
VLDL CHOLESTEROL CAL: 24 mg/dL (ref 5–40)

## 2018-02-10 LAB — CMP14+EGFR
ALK PHOS: 34 IU/L — AB (ref 39–117)
ALT: 26 IU/L (ref 0–32)
AST: 24 IU/L (ref 0–40)
Albumin/Globulin Ratio: 1.7 (ref 1.2–2.2)
Albumin: 4.5 g/dL (ref 3.6–4.8)
BUN/Creatinine Ratio: 24 (ref 12–28)
BUN: 22 mg/dL (ref 8–27)
Bilirubin Total: 0.4 mg/dL (ref 0.0–1.2)
CO2: 22 mmol/L (ref 20–29)
CREATININE: 0.92 mg/dL (ref 0.57–1.00)
Calcium: 9.9 mg/dL (ref 8.7–10.3)
Chloride: 105 mmol/L (ref 96–106)
GFR calc Af Amer: 75 mL/min/{1.73_m2} (ref >59)
GFR calc non Af Amer: 65 mL/min/{1.73_m2} (ref >59)
GLUCOSE: 91 mg/dL (ref 65–99)
Globulin, Total: 2.7 g/dL (ref 1.5–4.5)
Potassium: 5.1 mmol/L (ref 3.5–5.2)
SODIUM: 143 mmol/L (ref 134–144)
Total Protein: 7.2 g/dL (ref 6.0–8.5)

## 2018-07-22 ENCOUNTER — Other Ambulatory Visit (HOSPITAL_COMMUNITY): Payer: Self-pay | Admitting: Nurse Practitioner

## 2018-07-22 DIAGNOSIS — Z1231 Encounter for screening mammogram for malignant neoplasm of breast: Secondary | ICD-10-CM

## 2018-08-12 ENCOUNTER — Ambulatory Visit: Payer: Medicare Other | Admitting: Nurse Practitioner

## 2018-09-19 ENCOUNTER — Ambulatory Visit: Payer: Medicare Other | Admitting: Nurse Practitioner

## 2018-09-19 ENCOUNTER — Other Ambulatory Visit: Payer: Self-pay

## 2018-09-20 ENCOUNTER — Encounter: Payer: Self-pay | Admitting: Nurse Practitioner

## 2018-09-20 ENCOUNTER — Ambulatory Visit (INDEPENDENT_AMBULATORY_CARE_PROVIDER_SITE_OTHER): Payer: Medicare Other | Admitting: Nurse Practitioner

## 2018-09-20 VITALS — BP 132/76 | HR 76 | Temp 98.3°F | Ht 64.0 in | Wt 154.2 lb

## 2018-09-20 DIAGNOSIS — M85851 Other specified disorders of bone density and structure, right thigh: Secondary | ICD-10-CM | POA: Diagnosis not present

## 2018-09-20 DIAGNOSIS — E559 Vitamin D deficiency, unspecified: Secondary | ICD-10-CM

## 2018-09-20 DIAGNOSIS — R609 Edema, unspecified: Secondary | ICD-10-CM | POA: Diagnosis not present

## 2018-09-20 DIAGNOSIS — E782 Mixed hyperlipidemia: Secondary | ICD-10-CM | POA: Diagnosis not present

## 2018-09-20 DIAGNOSIS — M85852 Other specified disorders of bone density and structure, left thigh: Secondary | ICD-10-CM | POA: Diagnosis not present

## 2018-09-20 DIAGNOSIS — Z6826 Body mass index (BMI) 26.0-26.9, adult: Secondary | ICD-10-CM

## 2018-09-20 DIAGNOSIS — R600 Localized edema: Secondary | ICD-10-CM

## 2018-09-20 MED ORDER — ATORVASTATIN CALCIUM 40 MG PO TABS: 40.0000 mg | ORAL_TABLET | Freq: Every day | ORAL | 1 refills | Status: DC

## 2018-09-20 MED ORDER — FENOFIBRATE 160 MG PO TABS: 160.0000 mg | ORAL_TABLET | Freq: Every day | ORAL | 1 refills | Status: DC

## 2018-09-20 NOTE — Patient Instructions (Signed)

## 2018-09-20 NOTE — Progress Notes (Signed)
Subjective:    Patient ID: Lori Ross, female    DOB: [birth year], 68 y.o.   MRN: [id number]   Chief Complaint: Medical Management of Chronic Issues    HPI:  1. Mixed hyperlipidemia Watches diet and walks for exercise several times a week.  2. Peripheral edema Has some edema at times. Will usually resolve during night.  3. Vitamin D deficiency Takes daily vitamin d supplement  4. Osteopenia of both hips Last dexascan was done  02/09/18 with t score of -2.0. she does do weight bearing exercise.  5. BMI 26.0-26.9,adult No recent weight changes    Outpatient Encounter Medications as of 09/20/2018  Medication Sig  . atorvastatin (LIPITOR) 40 MG tablet Take 1 tablet (40 mg total) by mouth daily.  . calcium-vitamin D (OSCAL WITH D) 500-200 MG-UNIT per tablet Take 1 tablet by mouth daily.  . Cholecalciferol (VITAMIN D3) 2000 UNITS TABS Take 1 tablet by mouth daily.    . fenofibrate 160 MG tablet Take 1 tablet (160 mg total) by mouth daily.  . fish oil-omega-3 fatty acids 1000 MG capsule Take 2 g by mouth daily.   No facility-administered encounter medications on file as of 09/20/2018.     Past Surgical History:  Procedure Laterality Date  . BREAST EXCISIONAL BIOPSY Right    papiloma  . BREAST LUMPECTOMY WITH RADIOACTIVE SEED LOCALIZATION Right 10/27/2016   Procedure: RIGHT BREAST LUMPECTOMY WITH RADIOACTIVE SEED LOCALIZATION;  Surgeon: Fanny Skates, MD;  Location: [place];  Service: General;  Laterality: Right;  . COLONOSCOPY      Family History  Problem Relation Age of Onset  . Stroke Father 49  . Hypertension Sister   . Stroke Sister 19  . Healthy Daughter   . Healthy Son     New complaints: None  today  Social history: Lives by herself with dogs and cats- family checks on her daily.  Controlled substance contract: N/A    Review of Systems  Constitutional: Negative for activity change and appetite change.  HENT: Negative.   Eyes:  Negative for pain.  Respiratory: Negative for shortness of breath.   Cardiovascular: Negative for chest pain, palpitations and leg swelling.  Gastrointestinal: Negative for abdominal pain.  Endocrine: Negative for polydipsia.  Genitourinary: Negative.   Skin: Negative for rash.  Neurological: Negative for dizziness, weakness and headaches.  Hematological: Does not bruise/bleed easily.  Psychiatric/Behavioral: Negative.   All other systems reviewed and are negative.      Objective:   Physical Exam Vitals signs and nursing note reviewed.  Constitutional:      General: She is not in acute distress.    Appearance: Normal appearance. She is well-developed.  HENT:     Head: Normocephalic.     Nose: Nose normal.  Eyes:     Pupils: Pupils are equal, round, and reactive to light.  Neck:     Musculoskeletal: Normal range of motion and neck supple.     Vascular: No carotid bruit or JVD.  Cardiovascular:     Rate and Rhythm: Normal rate and regular rhythm.     Heart sounds: Normal heart sounds.  Pulmonary:     Effort: Pulmonary effort is normal. No respiratory distress.     Breath sounds: Normal breath sounds. No wheezing or rales.  Chest:     Chest wall: No tenderness.  Abdominal:     General: Bowel sounds are normal. There is no distension or abdominal bruit.     Palpations: Abdomen is  soft. There is no hepatomegaly, splenomegaly, mass or pulsatile mass.     Tenderness: There is no abdominal tenderness.  Musculoskeletal: Normal range of motion.  Lymphadenopathy:     Cervical: No cervical adenopathy.  Skin:    General: Skin is warm and dry.  Neurological:     Mental Status: She is alert and oriented to person, place, and time.     Deep Tendon Reflexes: Reflexes are normal and symmetric.  Psychiatric:        Behavior: Behavior normal.        Thought Content: Thought content normal.        Judgment: Judgment normal.    BP 132/76 (BP Location: Left Arm, Cuff Size: Normal)    Pulse 76   Temp 98.3 F (36.8 C) (Oral)   Ht 5' 4"  (1.626 m)   Wt 154 lb 3.2 oz (69.9 kg)   BMI 26.47 kg/m          Assessment & Plan:  TAUNIA FRASCO comes in today with chief complaint of Medical Management of Chronic Issues   Diagnosis and orders addressed:  1. Mixed hyperlipidemia Low fat diet - atorvastatin (LIPITOR) 40 MG tablet; Take 1 tablet (40 mg total) by mouth daily.  Dispense: 90 tablet; Refill: 1 - fenofibrate 160 MG tablet; Take 1 tablet (160 mg total) by mouth daily.  Dispense: 90 tablet; Refill: 1 - CMP14+EGFR - Lipid panel  2. Peripheral edema elevat legs when sitting  3. Vitamin D deficiency Continue daily vitamin d supplement  4. Osteopenia of both hips Weight bearing exercises discusssed  5. BMI 26.0-26.9,adult Discussed diet and exercise for person with BMI >25 Will recheck weight in 3-6 months   Labs pending Health Maintenance reviewed Diet and exercise encouraged  Follow up plan: 6 months   [name], FNP

## 2018-09-21 LAB — CMP14+EGFR
ALT: 20 IU/L (ref 0–32)
AST: 25 IU/L (ref 0–40)
Albumin/Globulin Ratio: 1.6 (ref 1.2–2.2)
Albumin: 4.3 g/dL (ref 3.8–4.8)
Alkaline Phosphatase: 29 IU/L — ABNORMAL LOW (ref 39–117)
BUN/Creatinine Ratio: 20 (ref 12–28)
BUN: 19 mg/dL (ref 8–27)
Bilirubin Total: 0.4 mg/dL (ref 0.0–1.2)
CO2: 22 mmol/L (ref 20–29)
Calcium: 9.7 mg/dL (ref 8.7–10.3)
Chloride: 105 mmol/L (ref 96–106)
Creatinine, Ser: 0.97 mg/dL (ref 0.57–1.00)
GFR calc Af Amer: 70 mL/min/{1.73_m2} (ref >59)
GFR calc non Af Amer: 61 mL/min/{1.73_m2} (ref >59)
Globulin, Total: 2.7 g/dL (ref 1.5–4.5)
Glucose: 96 mg/dL (ref 65–99)
Potassium: 4.4 mmol/L (ref 3.5–5.2)
Sodium: 142 mmol/L (ref 134–144)
Total Protein: 7 g/dL (ref 6.0–8.5)

## 2018-09-21 LAB — LIPID PANEL
Chol/HDL Ratio: 3.8 ratio (ref 0.0–4.4)
Cholesterol, Total: 134 mg/dL (ref 100–199)
HDL: 35 mg/dL — ABNORMAL LOW (ref >39)
LDL Calculated: 72 mg/dL (ref 0–99)
Triglycerides: 134 mg/dL (ref 0–149)
VLDL Cholesterol Cal: 27 mg/dL (ref 5–40)

## 2018-10-03 ENCOUNTER — Ambulatory Visit (HOSPITAL_COMMUNITY)
Admission: RE | Admit: 2018-10-03 | Discharge: 2018-10-03 | Disposition: A | Discharge status: Home / Self Care | Payer: Medicare Other | Source: Ambulatory Visit | Attending: Nurse Practitioner | Admitting: Nurse Practitioner

## 2018-10-03 ENCOUNTER — Other Ambulatory Visit: Payer: Self-pay

## 2018-10-03 DIAGNOSIS — Z1231 Encounter for screening mammogram for malignant neoplasm of breast: Secondary | ICD-10-CM | POA: Insufficient documentation

## 2018-11-11 ENCOUNTER — Ambulatory Visit (INDEPENDENT_AMBULATORY_CARE_PROVIDER_SITE_OTHER): Payer: Medicare Other

## 2018-11-11 ENCOUNTER — Other Ambulatory Visit: Payer: Self-pay

## 2018-11-11 DIAGNOSIS — Z23 Encounter for immunization: Secondary | ICD-10-CM | POA: Diagnosis not present

## 2019-01-03 DIAGNOSIS — Z1211 Encounter for screening for malignant neoplasm of colon: Secondary | ICD-10-CM | POA: Diagnosis not present

## 2019-01-03 DIAGNOSIS — K5730 Diverticulosis of large intestine without perforation or abscess without bleeding: Secondary | ICD-10-CM | POA: Diagnosis not present

## 2019-01-03 DIAGNOSIS — Z86010 Personal history of colonic polyps: Secondary | ICD-10-CM | POA: Diagnosis not present

## 2019-02-01 ENCOUNTER — Other Ambulatory Visit: Payer: Self-pay

## 2019-02-15 DIAGNOSIS — D122 Benign neoplasm of ascending colon: Secondary | ICD-10-CM | POA: Diagnosis not present

## 2019-02-15 DIAGNOSIS — K621 Rectal polyp: Secondary | ICD-10-CM | POA: Diagnosis not present

## 2019-02-15 DIAGNOSIS — Z86010 Personal history of colonic polyps: Secondary | ICD-10-CM | POA: Diagnosis not present

## 2019-02-15 DIAGNOSIS — D128 Benign neoplasm of rectum: Secondary | ICD-10-CM | POA: Diagnosis not present

## 2019-02-15 DIAGNOSIS — Z1211 Encounter for screening for malignant neoplasm of colon: Secondary | ICD-10-CM | POA: Diagnosis not present

## 2019-02-15 DIAGNOSIS — K635 Polyp of colon: Secondary | ICD-10-CM | POA: Diagnosis not present

## 2019-03-23 ENCOUNTER — Ambulatory Visit: Payer: Self-pay | Admitting: Nurse Practitioner

## 2019-03-27 ENCOUNTER — Other Ambulatory Visit: Payer: Self-pay

## 2019-03-28 ENCOUNTER — Encounter: Payer: Self-pay | Admitting: Nurse Practitioner

## 2019-03-28 ENCOUNTER — Ambulatory Visit (INDEPENDENT_AMBULATORY_CARE_PROVIDER_SITE_OTHER): Payer: Medicare Other | Admitting: Nurse Practitioner

## 2019-03-28 ENCOUNTER — Ambulatory Visit (INDEPENDENT_AMBULATORY_CARE_PROVIDER_SITE_OTHER): Payer: Medicare Other

## 2019-03-28 VITALS — BP 134/83 | HR 87 | Temp 97.3°F | Resp 20 | Ht 64.0 in | Wt 152.0 lb

## 2019-03-28 DIAGNOSIS — E559 Vitamin D deficiency, unspecified: Secondary | ICD-10-CM | POA: Diagnosis not present

## 2019-03-28 DIAGNOSIS — R609 Edema, unspecified: Secondary | ICD-10-CM

## 2019-03-28 DIAGNOSIS — E782 Mixed hyperlipidemia: Secondary | ICD-10-CM

## 2019-03-28 DIAGNOSIS — R062 Wheezing: Secondary | ICD-10-CM | POA: Diagnosis not present

## 2019-03-28 DIAGNOSIS — M85852 Other specified disorders of bone density and structure, left thigh: Secondary | ICD-10-CM | POA: Diagnosis not present

## 2019-03-28 DIAGNOSIS — M85851 Other specified disorders of bone density and structure, right thigh: Secondary | ICD-10-CM | POA: Diagnosis not present

## 2019-03-28 DIAGNOSIS — Z6826 Body mass index (BMI) 26.0-26.9, adult: Secondary | ICD-10-CM | POA: Diagnosis not present

## 2019-03-28 MED ORDER — FENOFIBRATE 160 MG PO TABS: 160.0000 mg | ORAL_TABLET | Freq: Every day | ORAL | 1 refills | Status: DC

## 2019-03-28 MED ORDER — ATORVASTATIN CALCIUM 40 MG PO TABS: 40.0000 mg | ORAL_TABLET | Freq: Every day | ORAL | 1 refills | Status: DC

## 2019-03-28 NOTE — Addendum Note (Signed)
Addended by: Rolena Infante on: 03/28/2019 09:58 AM   Modules accepted: Orders

## 2019-03-28 NOTE — Patient Instructions (Signed)
Exercising to Stay Healthy To become healthy and stay healthy, it is recommended that you do moderate-intensity and vigorous-intensity exercise. You can tell that you are exercising at a moderate intensity if your heart starts beating faster and you start breathing faster but can still hold a conversation. You can tell that you are exercising at a vigorous intensity if you are breathing much harder and faster and cannot hold a conversation while exercising. Exercising regularly is important. It has many health benefits, such as:  Improving overall fitness, flexibility, and endurance.  Increasing bone density.  Helping with weight control.  Decreasing body fat.  Increasing muscle strength.  Reducing stress and tension.  Improving overall health. How often should I exercise? Choose an activity that you enjoy, and set realistic goals. Your health care provider can help you make an activity plan that works for you. Exercise regularly as told by your health care provider. This may include:  Doing strength training two times a week, such as: ? Lifting weights. ? Using resistance bands. ? Push-ups. ? Sit-ups. ? Yoga.  Doing a certain intensity of exercise for a given amount of time. Choose from these options: ? A total of 150 minutes of moderate-intensity exercise every week. ? A total of 75 minutes of vigorous-intensity exercise every week. ? A mix of moderate-intensity and vigorous-intensity exercise every week. Children, pregnant women, people who have not exercised regularly, people who are overweight, and older adults may need to talk with a health care provider about what activities are safe to do. If you have a medical condition, be sure to talk with your health care provider before you start a new exercise program. What are some exercise ideas? Moderate-intensity exercise ideas include:  Walking 1 mile (1.6 km) in about 15  minutes.  Biking.  Hiking.  Golfing.  Dancing.  Water aerobics. Vigorous-intensity exercise ideas include:  Walking 4.5 miles (7.2 km) or more in about 1 hour.  Jogging or running 5 miles (8 km) in about 1 hour.  Biking 10 miles (16.1 km) or more in about 1 hour.  Lap swimming.  Roller-skating or in-line skating.  Cross-country skiing.  Vigorous competitive sports, such as football, basketball, and soccer.  Jumping rope.  Aerobic dancing. What are some everyday activities that can help me to get exercise?  Yard work, such as: ? Pushing a lawn mower. ? Raking and bagging leaves.  Washing your car.  Pushing a stroller.  Shoveling snow.  Gardening.  Washing windows or floors. How can I be more active in my day-to-day activities?  Use stairs instead of an elevator.  Take a walk during your lunch break.  If you drive, park your car farther away from your work or school.  If you take public transportation, get off one stop early and walk the rest of the way.  Stand up or walk around during all of your indoor phone calls.  Get up, stretch, and walk around every 30 minutes throughout the day.  Enjoy exercise with a friend. Support to continue exercising will help you keep a regular routine of activity. What guidelines can I follow while exercising?  Before you start a new exercise program, talk with your health care provider.  Do not exercise so much that you hurt yourself, feel dizzy, or get very short of breath.  Wear comfortable clothes and wear shoes with good support.  Drink plenty of water while you exercise to prevent dehydration or heat stroke.  Work out until your breathing   and your heartbeat get faster. Where to find more information  [place] Department of Health and Human Services: www.hhs.gov  Centers for Disease Control and Prevention (CDC): www.cdc.gov Summary  Exercising regularly is important. It will improve your overall fitness,  flexibility, and endurance.  Regular exercise also will improve your overall health. It can help you control your weight, reduce stress, and improve your bone density.  Do not exercise so much that you hurt yourself, feel dizzy, or get very short of breath.  Before you start a new exercise program, talk with your health care provider. This information is not intended to replace advice given to you by your health care provider. Make sure you discuss any questions you have with your health care provider. Document Revised: 02/05/2017 Document Reviewed: 01/14/2017 Elsevier Patient Education  2020 [employer] Inc[employer]  

## 2019-03-28 NOTE — Progress Notes (Signed)
Subjective:    Patient ID: Lori Ross, female    DOB: 1950/04/18, 69 y.o.   MRN: [id number]   Chief Complaint: Medical Management of Chronic Issues    HPI:  1. Mixed hyperlipidemia Watching diet and does walk 2-3 x a week. Lab Results  Component Value Date   CHOL 134 09/20/2018   HDL 35 (L) 09/20/2018   LDLCALC 72 09/20/2018   TRIG 134 09/20/2018   CHOLHDL 3.8 09/20/2018     2. Osteopenia of both hips Last dexascan was done 02/09/18 with t score of -2.0. she does take vitamin d and calcium supplements daily.  3. Vitamin D deficiency Takes daily supplement  4. Peripheral edema On occasion, but usually resolves during the night.  5. BMI 26.0-26.9,adult No recent weight changes Wt Readings from Last 3 Encounters:  03/28/19 152 lb (68.9 kg)  09/20/18 154 lb 3.2 oz (69.9 kg)  02/09/18 157 lb (71.2 kg)   BMI Readings from Last 3 Encounters:  03/28/19 26.09 kg/m  09/20/18 26.47 kg/m  02/09/18 26.95 kg/m       Outpatient Encounter Medications as of 03/28/2019  Medication Sig  . atorvastatin (LIPITOR) 40 MG tablet Take 1 tablet (40 mg total) by mouth daily.  . calcium-vitamin D (OSCAL WITH D) 500-200 MG-UNIT per tablet Take 1 tablet by mouth daily.  . Cholecalciferol (VITAMIN D3) 2000 UNITS TABS Take 1 tablet by mouth daily.    . fenofibrate 160 MG tablet Take 1 tablet (160 mg total) by mouth daily.  . fish oil-omega-3 fatty acids 1000 MG capsule Take 2 g by mouth daily.     Past Surgical History:  Procedure Laterality Date  . BREAST EXCISIONAL BIOPSY Right    papiloma  . BREAST LUMPECTOMY WITH RADIOACTIVE SEED LOCALIZATION Right 10/27/2016   Procedure: RIGHT BREAST LUMPECTOMY WITH RADIOACTIVE SEED LOCALIZATION;  Surgeon: Fanny Skates, MD;  Location: [place];  Service: General;  Laterality: Right;  . COLONOSCOPY      Family History  Problem Relation Age of Onset  . Stroke Father 34  . Hypertension Sister   . Stroke Sister 5  .  Healthy Daughter   . Healthy Son     New complaints: None today  Social history: Lives by herself  Controlled substance contract: *n/a**    Review of Systems  Constitutional: Negative for diaphoresis.  HENT: Negative.   Eyes: Negative for pain.  Respiratory: Negative for shortness of breath.   Cardiovascular: Negative for chest pain, palpitations and leg swelling.  Gastrointestinal: Negative for abdominal pain.  Endocrine: Negative for polydipsia.  Genitourinary: Negative.   Skin: Negative for rash.  Neurological: Negative for dizziness, weakness and headaches.  Hematological: Does not bruise/bleed easily.  All other systems reviewed and are negative.      Objective:   Physical Exam Vitals and nursing note reviewed.  Constitutional:      General: She is not in acute distress.    Appearance: Normal appearance. She is well-developed.  HENT:     Head: Normocephalic.     Nose: Nose normal.  Eyes:     Pupils: Pupils are equal, round, and reactive to light.  Neck:     Vascular: No carotid bruit or JVD.  Cardiovascular:     Rate and Rhythm: Normal rate and regular rhythm.     Heart sounds: Normal heart sounds.  Pulmonary:     Effort: Pulmonary effort is normal. No respiratory distress.     Breath sounds: Normal breath sounds.  No wheezing or rales.  Chest:     Chest wall: No tenderness.     Breasts:        Right: Normal.        Left: Normal.  Abdominal:     General: Bowel sounds are normal. There is no distension or abdominal bruit.     Palpations: Abdomen is soft. There is no hepatomegaly, splenomegaly, mass or pulsatile mass.     Tenderness: There is no abdominal tenderness.  Musculoskeletal:        General: Normal range of motion.     Cervical back: Normal range of motion and neck supple.  Lymphadenopathy:     Cervical: No cervical adenopathy.  Skin:    General: Skin is warm and dry.  Neurological:     Mental Status: She is alert and oriented to person,  place, and time.     Deep Tendon Reflexes: Reflexes are normal and symmetric.  Psychiatric:        Behavior: Behavior normal.        Thought Content: Thought content normal.        Judgment: Judgment normal.    BP 134/83   Pulse 87   Temp (!) 97.3 F (36.3 C) (Temporal)   Resp 20   Ht 5\' 4"  (1.626 m)   Wt 152 lb (68.9 kg)   SpO2 96%   BMI 26.09 kg/m   Chest xray- no acute or chronic cardiopulmonary issues-Preliminary reading by Ronnald Collum, FNP  [facility]  EKG- NSR-[name], FNP       Assessment & Plan:   Lori Ross comes in today with chief complaint of Medical Management of Chronic Issues   Diagnosis and orders addressed:  1. Mixed hyperlipidemia Low fat diet - DG Chest 2 View; Future - EKG 12-Lead - atorvastatin (LIPITOR) 40 MG tablet; Take 1 tablet (40 mg total) by mouth daily.  Dispense: 90 tablet; Refill: 1 - fenofibrate 160 MG tablet; Take 1 tablet (160 mg total) by mouth daily.  Dispense: 90 tablet; Refill: 1  2. Osteopenia of both hips Weight bearing exercise  3. Vitamin D deficiency continue daily vitamin d supplement  4. Peripheral edema Elevate legs when swollen  5. BMI 26.0-26.9,adult Discussed diet and exercise for person with BMI >25 Will recheck weight in 3-6 months   Labs pending Health Maintenance reviewed Diet and exercise encouraged  Follow up plan: 6 minths   [place], FNP

## 2019-03-29 LAB — LIPID PANEL
Chol/HDL Ratio: 3.7 ratio (ref 0.0–4.4)
Cholesterol, Total: 163 mg/dL (ref 100–199)
HDL: 44 mg/dL (ref >39)
LDL Chol Calc (NIH): 93 mg/dL (ref 0–99)
Triglycerides: 150 mg/dL — ABNORMAL HIGH (ref 0–149)
VLDL Cholesterol Cal: 26 mg/dL (ref 5–40)

## 2019-03-29 LAB — CMP14+EGFR
ALT: 20 IU/L (ref 0–32)
AST: 21 IU/L (ref 0–40)
Albumin/Globulin Ratio: 1.9 (ref 1.2–2.2)
Albumin: 4.8 g/dL (ref 3.8–4.8)
Alkaline Phosphatase: 41 IU/L (ref 39–117)
BUN/Creatinine Ratio: 18 (ref 12–28)
BUN: 19 mg/dL (ref 8–27)
Bilirubin Total: 0.4 mg/dL (ref 0.0–1.2)
CO2: 21 mmol/L (ref 20–29)
Calcium: 10.4 mg/dL — ABNORMAL HIGH (ref 8.7–10.3)
Chloride: 104 mmol/L (ref 96–106)
Creatinine, Ser: 1.06 mg/dL — ABNORMAL HIGH (ref 0.57–1.00)
GFR calc Af Amer: 62 mL/min/{1.73_m2} (ref >59)
GFR calc non Af Amer: 54 mL/min/{1.73_m2} — ABNORMAL LOW (ref >59)
Globulin, Total: 2.5 g/dL (ref 1.5–4.5)
Glucose: 90 mg/dL (ref 65–99)
Potassium: 4.7 mmol/L (ref 3.5–5.2)
Sodium: 142 mmol/L (ref 134–144)
Total Protein: 7.3 g/dL (ref 6.0–8.5)

## 2019-03-29 LAB — VITAMIN D 25 HYDROXY (VIT D DEFICIENCY, FRACTURES): Vit D, 25-Hydroxy: 35.7 ng/mL (ref 30.0–100.0)

## 2019-04-13 DIAGNOSIS — Z23 Encounter for immunization: Secondary | ICD-10-CM | POA: Diagnosis not present

## 2019-04-25 ENCOUNTER — Ambulatory Visit (INDEPENDENT_AMBULATORY_CARE_PROVIDER_SITE_OTHER): Payer: Medicare Other | Admitting: *Deleted

## 2019-04-25 DIAGNOSIS — Z0000 Encounter for general adult medical examination without abnormal findings: Secondary | ICD-10-CM

## 2019-04-25 NOTE — Progress Notes (Signed)
MEDICARE ANNUAL WELLNESS VISIT  04/25/2019  Telephone Visit Disclaimer This Medicare AWV was conducted by telephone due to national recommendations for restrictions regarding the COVID-19 Pandemic (e.g. social distancing).  I verified, using two identifiers, that I am speaking with Lori Ross or their authorized healthcare agent. I discussed the limitations, risks, security, and privacy concerns of performing an evaluation and management service by telephone and the potential availability of an in-person appointment in the future. The patient expressed understanding and agreed to proceed.   Subjective:  Lori Ross is a 69 y.o. female patient of Lori Ross, [place] who had a Medicare Annual Wellness Visit today via telephone. Sonata is Retired and lives alone. she has 2 children. she reports that she is socially active and does interact with friends/family regularly. she is minimally physically active and enjoys gardening, reading, knitting, sewing, and taking care of her cats and dogs.  Patient Care Team: Lori Pretty, FNP as PCP - General (Nurse Practitioner)  Advanced Directives 04/25/2019 10/05/2017 10/27/2016 10/19/2016 09/16/2016  Does Patient Have a Medical Advance Directive? Yes Yes Yes Yes Yes  Type of Paramedic of [place];Living will Living will Living will - Living will  Does patient want to make changes to medical advance directive? - No - Patient declined No - Patient declined - -  Copy of Lori Ross in Chart? No - copy requested - - No - copy requested [facility] Utilization Over the Past 12 Months: # of hospitalizations or ER visits: 0 # of surgeries: 0  Review of Systems    Patient reports that her overall health is unchanged compared to last year.  History obtained from chart review and the patient  Patient Reported Readings (BP, Pulse, CBG, Weight, etc) none  Pain Assessment Pain : No/denies  pain     Current Medications & Allergies (verified) Allergies as of 04/25/2019   No Known Allergies     Medication List       Accurate as of April 25, 2019  9:26 AM. If you have any questions, ask your nurse or doctor.        atorvastatin 40 MG tablet Commonly known as: LIPITOR Take 1 tablet (40 mg total) by mouth daily.   calcium-vitamin D 500-200 MG-UNIT tablet Commonly known as: OSCAL WITH D Take 1 tablet by mouth daily.   fenofibrate 160 MG tablet Take 1 tablet (160 mg total) by mouth daily.   fish oil-omega-3 fatty acids 1000 MG capsule Take 2 g by mouth daily.   Vitamin D3 50 MCG (2000 UT) Tabs Take 1 tablet by mouth daily.       History (reviewed): Past Medical History:  Diagnosis Date  . Breast mass, right   . Breast mass, right 10/27/2016  . Hyperlipidemia   . Osteoporosis   . Vitamin D insufficiency    Past Surgical History:  Procedure Laterality Date  . BREAST EXCISIONAL BIOPSY Right    papiloma  . BREAST LUMPECTOMY WITH RADIOACTIVE SEED LOCALIZATION Right 10/27/2016   Procedure: RIGHT BREAST LUMPECTOMY WITH RADIOACTIVE SEED LOCALIZATION;  Surgeon: Fanny Skates, MD;  Location: [facility];  Service: General;  Laterality: Right;  . COLONOSCOPY     Family History  Problem Relation Age of Onset  . Cancer - Other Mother   . Stroke Father 42  . Hypertension Sister   . Stroke Sister 83  . Healthy Daughter   . Healthy Son  Social History   Socioeconomic History  . Marital status: Widowed    Spouse name: Not on file  . Number of children: 2  . Years of education: 48  . Highest education level: Associate degree: academic program  Occupational History  . Occupation: Tax Field seismologist    Comment: Retired  Tobacco Use  . Smoking status: Never Smoker  . Smokeless tobacco: Never Used  Substance and Sexual Activity  . Alcohol use: No  . Drug use: No  . Sexual activity: Not Currently    Birth control/protection:  Post-menopausal  Other Topics Concern  . Not on file  Social History Narrative  . Not on file   Social Determinants of Health   Financial Resource Strain:   . Difficulty of Paying Living Expenses: Not on file  Food Insecurity:   . Worried About Charity fundraiser in the Last Year: Not on file  . Ran Out of Food in the Last Year: Not on file  Transportation Needs:   . Lack of Transportation (Medical): Not on file  . Lack of Transportation (Non-Medical): Not on file  Physical Activity:   . Days of Exercise per Week: Not on file  . Minutes of Exercise per Session: Not on file  Stress:   . Feeling of Stress : Not on file  Social Connections:   . Frequency of Communication with Friends and Family: Not on file  . Frequency of Social Gatherings with Friends and Family: Not on file  . Attends Religious Services: Not on file  . Active Member of Clubs or Organizations: Not on file  . Attends Archivist Meetings: Not on file  . Marital Status: Not on file    Activities of Daily Living In your present state of health, do you have any difficulty performing the following activities: 04/25/2019  Hearing? N  Vision? N  Comment Wears glasses  Difficulty concentrating or making decisions? N  Walking or climbing stairs? N  Dressing or bathing? N  Doing errands, shopping? N  Preparing Food and eating ? N  Using the Toilet? N  In the past six months, have you accidently leaked urine? N  Do you have problems with loss of bowel control? N  Managing your Medications? N  Managing your Finances? N  Housekeeping or managing your Housekeeping? N  Some recent data might be hidden    Patient Education/ Literacy How often do you need to have someone help you when you read instructions, pamphlets, or other written materials from your doctor or pharmacy?: 1 - Never What is the last grade level you completed in school?: 2 years of college  Exercise Current Exercise Habits: The patient  does not participate in regular exercise at present;Home exercise routine, Time (Minutes): 40, Frequency (Times/Week): 7, Weekly Exercise (Minutes/Week): 280, Intensity: Mild, Exercise limited by: None identified  Diet Patient reports consuming 3 meals a day and 2 snack(s) a day Patient reports that her primary diet is: Regular Patient reports that she does have regular access to food.   Depression Screen PHQ 2/9 Scores 04/25/2019 03/28/2019 09/20/2018 02/09/2018 10/05/2017 08/06/2017 02/09/2017  PHQ - 2 Score 0 0 0 0 0 0 0  PHQ- 9 Score 1 - - - - - -     Fall Risk Fall Risk  04/25/2019 03/28/2019 02/01/2019 09/20/2018 02/09/2018  Falls in the past year? 0 0 0 0 0  Comment - - Emmi Telephone Survey: data to providers prior to load - -  Objective:  KATELYNN MCALPINE seemed alert and oriented and she participated appropriately during our telephone visit.  Blood Pressure Weight BMI  BP Readings from Last 3 Encounters:  03/28/19 134/83  09/20/18 132/76  02/09/18 130/81   Wt Readings from Last 3 Encounters:  03/28/19 152 lb (68.9 kg)  09/20/18 154 lb 3.2 oz (69.9 kg)  02/09/18 157 lb (71.2 kg)   BMI Readings from Last 1 Encounters:  03/28/19 26.09 kg/m    *Unable to obtain current vital signs, weight, and BMI due to telephone visit type  Hearing/Vision  . Jinan did not seem to have difficulty with hearing/understanding during the telephone conversation . Reports that she has had a formal eye exam by an eye care professional within the past year . Reports that she has not had a formal hearing evaluation within the past year *Unable to fully assess hearing and vision during telephone visit type  Cognitive Function: 6CIT Screen 04/25/2019  What Year? 0 points  What month? 0 points  What time? 0 points  Count back from 20 0 points  Months in reverse 0 points  Repeat phrase 0 points  Total Score 0   (Normal:0-7, Significant for Dysfunction: >8)  Normal Cognitive Function Screening:  Yes   Immunization & Health Maintenance Record Immunization History  Administered Date(s) Administered  . Fluad Quad(high Dose 65+) 11/11/2018  . Influenza Split 12/29/2012  . Influenza, High Dose Seasonal PF 12/10/2016, 12/06/2017  . Influenza,inj,Quad PF,6+ Mos 12/12/2013, 12/14/2014  . Influenza-Unspecified 12/30/2015  . Pneumococcal Conjugate-13 03/30/2016  . Pneumococcal Polysaccharide-23 08/06/2017  . Td 08/16/2009  . Zoster 05/17/2013    Health Maintenance  Topic Date Due  . Samul Dada  08/17/2019  . MAMMOGRAM  10/03/2019  . DEXA SCAN  02/10/2020  . COLONOSCOPY  02/15/2024  . INFLUENZA VACCINE  Completed  . Hepatitis C Screening  Completed  . PNA vac Low Risk Adult  Completed       Assessment  This is a routine wellness examination for JANNIFER TREDER.  Health Maintenance: Due or Overdue There are no preventive care reminders to display for this patient.  Lori Ross does not need a referral for [employer] Assistance: Care Management:   no Social Work:    no Prescription Assistance:  no Nutrition/Diabetes Education:  no   Plan:  Personalized Goals Goals Addressed   None    Personalized Health Maintenance & Screening Recommendations  Td vaccine Screening mammography Bone densitometry screening -Due 02/2020  Lung Cancer Screening Recommended: no (Low Dose CT Chest recommended if Age 65-80 years, 30 pack-year currently smoking OR have quit w/in past 15 years) Hepatitis C Screening recommended: not applicable HIV Screening recommended: no  Advanced Directives: Written information was not prepared per patient's request. Patient will bring copy of power of attorney and living will.  Referrals & Orders No orders of the defined types were placed in this encounter.   Follow-up Plan . Follow-up with Lori Pretty, FNP as planned . We will discuss updating tetanus at appointment in June . Keep up the great work   I have personally reviewed  and noted the following in the patient's chart:   . Medical and social history . Use of alcohol, tobacco or illicit drugs  . Current medications and supplements . Functional ability and status . Nutritional status . Physical activity . Advanced directives . List of other physicians . Hospitalizations, surgeries, and ER visits in previous 12 months . Vitals . Screenings to include cognitive, depression, and  falls . Referrals and appointments  In addition, I have reviewed and discussed with Lori Ross certain preventive protocols, quality metrics, and best practice recommendations. A written personalized care plan for preventive services as well as general preventive health recommendations is available and can be mailed to the patient at her request.      Lynnea Ferrier, LPN  [phone]

## 2019-05-12 DIAGNOSIS — Z23 Encounter for immunization: Secondary | ICD-10-CM | POA: Diagnosis not present

## 2019-08-24 ENCOUNTER — Other Ambulatory Visit (HOSPITAL_COMMUNITY): Payer: Self-pay | Admitting: *Deleted

## 2019-08-24 DIAGNOSIS — Z1231 Encounter for screening mammogram for malignant neoplasm of breast: Secondary | ICD-10-CM

## 2019-09-25 ENCOUNTER — Other Ambulatory Visit: Payer: Self-pay

## 2019-09-25 ENCOUNTER — Ambulatory Visit (INDEPENDENT_AMBULATORY_CARE_PROVIDER_SITE_OTHER): Payer: Medicare Other | Admitting: Nurse Practitioner

## 2019-09-25 ENCOUNTER — Encounter: Payer: Self-pay | Admitting: Nurse Practitioner

## 2019-09-25 VITALS — BP 128/78 | HR 70 | Temp 97.8°F | Resp 20 | Ht 64.0 in | Wt 144.0 lb

## 2019-09-25 DIAGNOSIS — E782 Mixed hyperlipidemia: Secondary | ICD-10-CM

## 2019-09-25 DIAGNOSIS — R609 Edema, unspecified: Secondary | ICD-10-CM | POA: Diagnosis not present

## 2019-09-25 DIAGNOSIS — Z6826 Body mass index (BMI) 26.0-26.9, adult: Secondary | ICD-10-CM | POA: Diagnosis not present

## 2019-09-25 DIAGNOSIS — M85852 Other specified disorders of bone density and structure, left thigh: Secondary | ICD-10-CM

## 2019-09-25 DIAGNOSIS — E559 Vitamin D deficiency, unspecified: Secondary | ICD-10-CM | POA: Diagnosis not present

## 2019-09-25 DIAGNOSIS — M85851 Other specified disorders of bone density and structure, right thigh: Secondary | ICD-10-CM

## 2019-09-25 MED ORDER — ATORVASTATIN CALCIUM 40 MG PO TABS: 40.0000 mg | ORAL_TABLET | Freq: Every day | ORAL | 1 refills | Status: DC

## 2019-09-25 MED ORDER — FENOFIBRATE 160 MG PO TABS: 160.0000 mg | ORAL_TABLET | Freq: Every day | ORAL | 1 refills | Status: DC

## 2019-09-25 NOTE — Progress Notes (Signed)
Subjective:    Patient ID: Lori Ross, female    DOB: 1950/06/28, 69 y.o.   MRN: [id number]   Chief Complaint: Medical Management of Chronic Issues    HPI:  1. Mixed hyperlipidemia Does try to watch diet. Walks for exercise almost daily. BP Readings from Last 3 Encounters:  03/28/19 134/83  09/20/18 132/76  02/09/18 130/81    2. Peripheral edema Doe snot take fluid pill for this. Will usually resolve during the night and redeveloped ruing the day.  3. Vitamin D deficiency Is on vitamin d supplement daily  4. Osteopenia of both hips Last dexascan was done on 02/09/18. T score was -2.0.   5. BMI 26.0-26.9,adult Weight down 8lbs Wt Readings from Last 3 Encounters:  09/25/19 144 lb (65.3 kg)  03/28/19 152 lb (68.9 kg)  09/20/18 154 lb 3.2 oz (69.9 kg)   BMI Readings from Last 3 Encounters:  09/25/19 24.72 kg/m  03/28/19 26.09 kg/m  09/20/18 26.47 kg/m       Outpatient Encounter Medications as of 09/25/2019  Medication Sig  . atorvastatin (LIPITOR) 40 MG tablet Take 1 tablet (40 mg total) by mouth daily.  . calcium-vitamin D (OSCAL WITH D) 500-200 MG-UNIT per tablet Take 1 tablet by mouth daily.  . Cholecalciferol (VITAMIN D3) 2000 UNITS TABS Take 1 tablet by mouth daily.    . fenofibrate 160 MG tablet Take 1 tablet (160 mg total) by mouth daily.  . fish oil-omega-3 fatty acids 1000 MG capsule Take 2 g by mouth daily.     Past Surgical History:  Procedure Laterality Date  . BREAST EXCISIONAL BIOPSY Right    papiloma  . BREAST LUMPECTOMY WITH RADIOACTIVE SEED LOCALIZATION Right 10/27/2016   Procedure: RIGHT BREAST LUMPECTOMY WITH RADIOACTIVE SEED LOCALIZATION;  Surgeon: Fanny Skates, MD;  Location: [facility];  Service: General;  Laterality: Right;  . COLONOSCOPY      Family History  Problem Relation Age of Onset  . Cancer - Other Mother   . Stroke Father 45  . Hypertension Sister   . Stroke Sister 38  . Healthy Daughter   .  Healthy Son     New complaints: None today  Social history: Lives by herself  Controlled substance contract: n/a    Review of Systems  Constitutional: Negative for diaphoresis.  Eyes: Negative for pain.  Respiratory: Negative for shortness of breath.   Cardiovascular: Negative for chest pain, palpitations and leg swelling.  Gastrointestinal: Negative for abdominal pain.  Endocrine: Negative for polydipsia.  Skin: Negative for rash.  Neurological: Negative for dizziness, weakness and headaches.  Hematological: Does not bruise/bleed easily.  All other systems reviewed and are negative.      Objective:   Physical Exam Vitals and nursing note reviewed.  Constitutional:      General: She is not in acute distress.    Appearance: Normal appearance. She is well-developed.  HENT:     Head: Normocephalic.     Nose: Nose normal.  Eyes:     Pupils: Pupils are equal, round, and reactive to light.  Neck:     Vascular: No carotid bruit or JVD.  Cardiovascular:     Rate and Rhythm: Normal rate and regular rhythm.     Heart sounds: Normal heart sounds.  Pulmonary:     Effort: Pulmonary effort is normal. No respiratory distress.     Breath sounds: Normal breath sounds. No wheezing or rales.  Chest:     Chest wall: No tenderness.  Abdominal:     General: Bowel sounds are normal. There is no distension or abdominal bruit.     Palpations: Abdomen is soft. There is no hepatomegaly, splenomegaly, mass or pulsatile mass.     Tenderness: There is no abdominal tenderness.  Musculoskeletal:        General: Normal range of motion.     Cervical back: Normal range of motion and neck supple.  Lymphadenopathy:     Cervical: No cervical adenopathy.  Skin:    General: Skin is warm and dry.  Neurological:     Mental Status: She is alert and oriented to person, place, and time.     Deep Tendon Reflexes: Reflexes are normal and symmetric.  Psychiatric:        Behavior: Behavior normal.          Thought Content: Thought content normal.        Judgment: Judgment normal.    BP 128/78   Pulse 70   Temp 97.8 F (36.6 C) (Temporal)   Resp 20   Ht 5' 4"  (1.626 m)   Wt 144 lb (65.3 kg)   SpO2 100%   BMI 24.72 kg/m         Assessment & Plan:  Lori Ross comes in today with chief complaint of Medical Management of Chronic Issues   Diagnosis and orders addressed:  1. Mixed hyperlipidemia continue low fat diet Patient wants to try taking lipitor qod AND SEE HOW CHOLESTEOLDOES. - atorvastatin (LIPITOR) 40 MG tablet; Take 1 tablet (40 mg total) by mouth daily.  Dispense: 90 tablet; Refill: 1 - fenofibrate 160 MG tablet; Take 1 tablet (160 mg total) by mouth daily.  Dispense: 90 tablet; Refill: 1 - CMP14+EGFR - Lipid panel  2. Peripheral edema Elevate legs when and if swell  3. Vitamin D deficiency - CBC with Differential/Platelet  4. Osteopenia of both hips Weight  bearing exercises  5. BMI 26.0-26.9,adult Discussed diet and exercise for person with BMI >25 Will recheck weight in 3-6 months    Labs pending Health Maintenance reviewed Diet and exercise encouraged  Follow up plan: 6 moinths   [place], FNP

## 2019-09-25 NOTE — Patient Instructions (Signed)
Peripheral Edema  Peripheral edema is swelling that is caused by a buildup of fluid. Peripheral edema most often affects the lower legs, ankles, and feet. It can also develop in the arms, hands, and face. The area of the body that has peripheral edema will look swollen. It may also feel heavy or warm. Your clothes may start to feel tight. Pressing on the area may make a temporary dent in your skin. You may not be able to move your swollen arm or leg as much as usual. There are many causes of peripheral edema. It can happen because of a complication of other conditions such as congestive heart failure, kidney disease, or a problem with your blood circulation. It also can be a side effect of certain medicines or because of an infection. It often happens to women during pregnancy. Sometimes, the cause is not known. Follow these instructions at home: Managing pain, stiffness, and swelling   Raise (elevate) your legs while you are sitting or lying down.  Move around often to prevent stiffness and to lessen swelling.  Do not sit or stand for long periods of time.  Wear support stockings as told by your health care provider. Medicines  Take over-the-counter and prescription medicines only as told by your health care provider.  Your health care provider may prescribe medicine to help your body get rid of excess water (diuretic). General instructions  Pay attention to any changes in your symptoms.  Follow instructions from your health care provider about limiting salt (sodium) in your diet. Sometimes, eating less salt may reduce swelling.  Moisturize skin daily to help prevent skin from cracking and draining.  Keep all follow-up visits as told by your health care provider. This is important. Contact a health care provider if you have:  A fever.  Edema that starts suddenly or is getting worse, especially if you are pregnant or have a medical condition.  Swelling in only one leg.  Increased  swelling, redness, or pain in one or both of your legs.  Drainage or sores at the area where you have edema. Get help right away if you:  Develop shortness of breath, especially when you are lying down.  Have pain in your chest or abdomen.  Feel weak.  Feel faint. Summary  Peripheral edema is swelling that is caused by a buildup of fluid. Peripheral edema most often affects the lower legs, ankles, and feet.  Move around often to prevent stiffness and to lessen swelling. Do not sit or stand for long periods of time.  Pay attention to any changes in your symptoms.  Contact a health care provider if you have edema that starts suddenly or is getting worse, especially if you are pregnant or have a medical condition.  Get help right away if you develop shortness of breath, especially when lying down. This information is not intended to replace advice given to you by your health care provider. Make sure you discuss any questions you have with your health care provider. Document Revised: 11/17/2017 Document Reviewed: 11/17/2017 Elsevier Patient Education  2020 Elsevier Inc.  

## 2019-09-26 LAB — CBC WITH DIFFERENTIAL/PLATELET
Basophils Absolute: 0 10*3/uL (ref 0.0–0.2)
Basos: 1 %
EOS (ABSOLUTE): 0.1 10*3/uL (ref 0.0–0.4)
Eos: 3 %
Hematocrit: 41.8 % (ref 34.0–46.6)
Hemoglobin: 13.4 g/dL (ref 11.1–15.9)
Immature Grans (Abs): 0 10*3/uL (ref 0.0–0.1)
Immature Granulocytes: 0 %
Lymphocytes Absolute: 1.4 10*3/uL (ref 0.7–3.1)
Lymphs: 33 %
MCH: 28.1 pg (ref 26.6–33.0)
MCHC: 32.1 g/dL (ref 31.5–35.7)
MCV: 88 fL (ref 79–97)
Monocytes Absolute: 0.4 10*3/uL (ref 0.1–0.9)
Monocytes: 10 %
Neutrophils Absolute: 2.3 10*3/uL (ref 1.4–7.0)
Neutrophils: 53 %
Platelets: 337 10*3/uL (ref 150–450)
RBC: 4.77 x10E6/uL (ref 3.77–5.28)
RDW: 14 % (ref 11.7–15.4)
WBC: 4.4 10*3/uL (ref 3.4–10.8)

## 2019-09-26 LAB — CMP14+EGFR
ALT: 16 IU/L (ref 0–32)
AST: 20 IU/L (ref 0–40)
Albumin/Globulin Ratio: 1.6 (ref 1.2–2.2)
Albumin: 4.3 g/dL (ref 3.8–4.8)
Alkaline Phosphatase: 34 IU/L — ABNORMAL LOW (ref 48–121)
BUN/Creatinine Ratio: 20 (ref 12–28)
BUN: 20 mg/dL (ref 8–27)
Bilirubin Total: 0.5 mg/dL (ref 0.0–1.2)
CO2: 23 mmol/L (ref 20–29)
Calcium: 9.6 mg/dL (ref 8.7–10.3)
Chloride: 106 mmol/L (ref 96–106)
Creatinine, Ser: 0.98 mg/dL (ref 0.57–1.00)
GFR calc Af Amer: 69 mL/min/{1.73_m2} (ref >59)
GFR calc non Af Amer: 59 mL/min/{1.73_m2} — ABNORMAL LOW (ref >59)
Globulin, Total: 2.7 g/dL (ref 1.5–4.5)
Glucose: 92 mg/dL (ref 65–99)
Potassium: 4.5 mmol/L (ref 3.5–5.2)
Sodium: 143 mmol/L (ref 134–144)
Total Protein: 7 g/dL (ref 6.0–8.5)

## 2019-09-26 LAB — LIPID PANEL
Chol/HDL Ratio: 3.5 ratio (ref 0.0–4.4)
Cholesterol, Total: 147 mg/dL (ref 100–199)
HDL: 42 mg/dL (ref >39)
LDL Chol Calc (NIH): 86 mg/dL (ref 0–99)
Triglycerides: 102 mg/dL (ref 0–149)
VLDL Cholesterol Cal: 19 mg/dL (ref 5–40)

## 2019-10-05 ENCOUNTER — Ambulatory Visit (HOSPITAL_COMMUNITY)
Admission: RE | Admit: 2019-10-05 | Discharge: 2019-10-05 | Disposition: A | Discharge status: Home / Self Care | Payer: Medicare Other | Source: Ambulatory Visit | Attending: Nurse Practitioner | Admitting: Nurse Practitioner

## 2019-10-05 ENCOUNTER — Other Ambulatory Visit: Payer: Self-pay

## 2019-10-05 DIAGNOSIS — Z1231 Encounter for screening mammogram for malignant neoplasm of breast: Secondary | ICD-10-CM | POA: Diagnosis not present

## 2019-12-07 ENCOUNTER — Other Ambulatory Visit: Payer: Self-pay

## 2019-12-07 ENCOUNTER — Ambulatory Visit (INDEPENDENT_AMBULATORY_CARE_PROVIDER_SITE_OTHER): Payer: Medicare Other | Admitting: *Deleted

## 2019-12-07 DIAGNOSIS — Z23 Encounter for immunization: Secondary | ICD-10-CM

## 2020-01-26 DIAGNOSIS — Z23 Encounter for immunization: Secondary | ICD-10-CM | POA: Diagnosis not present

## 2020-03-28 ENCOUNTER — Ambulatory Visit: Payer: Medicare Other | Admitting: Nurse Practitioner

## 2020-04-03 ENCOUNTER — Other Ambulatory Visit: Payer: Medicare Other

## 2020-04-03 DIAGNOSIS — Z20822 Contact with and (suspected) exposure to covid-19: Secondary | ICD-10-CM

## 2020-04-06 LAB — NOVEL CORONAVIRUS, NAA: SARS-CoV-2, NAA: NOT DETECTED

## 2020-04-06 LAB — SARS-COV-2, NAA 2 DAY TAT

## 2020-04-22 ENCOUNTER — Encounter: Payer: Self-pay | Admitting: Nurse Practitioner

## 2020-04-22 ENCOUNTER — Ambulatory Visit (INDEPENDENT_AMBULATORY_CARE_PROVIDER_SITE_OTHER): Payer: Medicare Other | Admitting: Nurse Practitioner

## 2020-04-22 ENCOUNTER — Other Ambulatory Visit: Payer: Self-pay

## 2020-04-22 ENCOUNTER — Ambulatory Visit (INDEPENDENT_AMBULATORY_CARE_PROVIDER_SITE_OTHER): Payer: Medicare Other

## 2020-04-22 VITALS — BP 135/86 | HR 79 | Temp 97.6°F | Resp 20 | Ht 64.0 in | Wt 154.0 lb

## 2020-04-22 DIAGNOSIS — Z6826 Body mass index (BMI) 26.0-26.9, adult: Secondary | ICD-10-CM

## 2020-04-22 DIAGNOSIS — M85852 Other specified disorders of bone density and structure, left thigh: Secondary | ICD-10-CM

## 2020-04-22 DIAGNOSIS — R609 Edema, unspecified: Secondary | ICD-10-CM | POA: Diagnosis not present

## 2020-04-22 DIAGNOSIS — E559 Vitamin D deficiency, unspecified: Secondary | ICD-10-CM

## 2020-04-22 DIAGNOSIS — E782 Mixed hyperlipidemia: Secondary | ICD-10-CM | POA: Diagnosis not present

## 2020-04-22 DIAGNOSIS — M8588 Other specified disorders of bone density and structure, other site: Secondary | ICD-10-CM | POA: Diagnosis not present

## 2020-04-22 DIAGNOSIS — M85851 Other specified disorders of bone density and structure, right thigh: Secondary | ICD-10-CM | POA: Diagnosis not present

## 2020-04-22 MED ORDER — FENOFIBRATE 160 MG PO TABS: 160.0000 mg | ORAL_TABLET | Freq: Every day | ORAL | 1 refills | Status: DC

## 2020-04-22 MED ORDER — ATORVASTATIN CALCIUM 40 MG PO TABS: 40.0000 mg | ORAL_TABLET | Freq: Every day | ORAL | 1 refills | Status: DC

## 2020-04-22 NOTE — Patient Instructions (Signed)

## 2020-04-22 NOTE — Progress Notes (Signed)
Subjective:    Patient ID: Lori Ross, female    DOB: 09-Jun-1950, 70 y.o.   MRN: [id number]   Chief Complaint: Medical Management of Chronic Issues    HPI:  1. Mixed hyperlipidemia Does not watch diet and does little to no exercise. Usually doesn ot exercise much in the winter due to weather. Lab Results  Component Value Date   CHOL 147 09/25/2019   HDL 42 09/25/2019   LDLCALC 86 09/25/2019   TRIG 102 09/25/2019   CHOLHDL 3.5 09/25/2019     2. Peripheral edema Has occasionally  3. Osteopenia of both hips Last dexascan was done 02/09/18. t score was -2.0.  4. Vitamin D deficiency Takes a vitamind supplement daily.  5. BMI 26.0-26.9,adult Weight is up 10lbs since last visit. Wt Readings from Last 3 Encounters:  04/22/20 154 lb (69.9 kg)  09/25/19 144 lb (65.3 kg)  03/28/19 152 lb (68.9 kg)   BMI Readings from Last 3 Encounters:  04/22/20 26.43 kg/m  09/25/19 24.72 kg/m  03/28/19 26.09 kg/m       Outpatient Encounter Medications as of 04/22/2020  Medication Sig  . atorvastatin (LIPITOR) 40 MG tablet Take 1 tablet (40 mg total) by mouth daily.  . calcium-vitamin D (OSCAL WITH D) 500-200 MG-UNIT per tablet Take 1 tablet by mouth daily.  . Cholecalciferol (VITAMIN D3) 2000 UNITS TABS Take 1 tablet by mouth daily.  . fenofibrate 160 MG tablet Take 1 tablet (160 mg total) by mouth daily.  . fish oil-omega-3 fatty acids 1000 MG capsule Take 2 g by mouth daily.     Past Surgical History:  Procedure Laterality Date  . BREAST EXCISIONAL BIOPSY Right    papiloma  . BREAST LUMPECTOMY WITH RADIOACTIVE SEED LOCALIZATION Right 10/27/2016   Procedure: RIGHT BREAST LUMPECTOMY WITH RADIOACTIVE SEED LOCALIZATION;  Surgeon: Fanny Skates, MD;  Location: [facility];  Service: General;  Laterality: Right;  . COLONOSCOPY      Family History  Problem Relation Age of Onset  . Cancer - Other Mother   . Stroke Father 58  . Hypertension Sister   .  Stroke Sister 54  . Healthy Daughter   . Healthy Son     New complaints: None today  Social history: Lives by herself with her cats and dogs  Controlled substance contract: n/a    Review of Systems  Constitutional: Negative for diaphoresis.  Eyes: Negative for pain.  Respiratory: Negative for shortness of breath.   Cardiovascular: Negative for chest pain, palpitations and leg swelling.  Gastrointestinal: Negative for abdominal pain.  Endocrine: Negative for polydipsia.  Skin: Negative for rash.  Neurological: Negative for dizziness, weakness and headaches.  Hematological: Does not bruise/bleed easily.  All other systems reviewed and are negative.      Objective:   Physical Exam Vitals and nursing note reviewed.  Constitutional:      General: She is not in acute distress.    Appearance: Normal appearance. She is well-developed and well-nourished.  HENT:     Head: Normocephalic.     Nose: Nose normal.     Mouth/Throat:     Mouth: Oropharynx is clear and moist.  Eyes:     Extraocular Movements: EOM normal.     Pupils: Pupils are equal, round, and reactive to light.  Neck:     Vascular: No carotid bruit or JVD.  Cardiovascular:     Rate and Rhythm: Normal rate and regular rhythm.     Pulses: Intact distal  pulses.     Heart sounds: Normal heart sounds.  Pulmonary:     Effort: Pulmonary effort is normal. No respiratory distress.     Breath sounds: Normal breath sounds. No wheezing or rales.  Chest:     Chest wall: No tenderness.  Abdominal:     General: Bowel sounds are normal. There is no distension or abdominal bruit. Aorta is normal.     Palpations: Abdomen is soft. There is no hepatomegaly, splenomegaly, mass or pulsatile mass.     Tenderness: There is no abdominal tenderness.  Musculoskeletal:        General: No edema. Normal range of motion.     Cervical back: Normal range of motion and neck supple.  Lymphadenopathy:     Cervical: No cervical adenopathy.   Skin:    General: Skin is warm and dry.  Neurological:     Mental Status: She is alert and oriented to person, place, and time.     Deep Tendon Reflexes: Reflexes are normal and symmetric.  Psychiatric:        Mood and Affect: Mood and affect normal.        Behavior: Behavior normal.        Thought Content: Thought content normal.        Judgment: Judgment normal.    BP 135/86   Pulse 79   Temp 97.6 F (36.4 C) (Temporal)   Resp 20   Ht 5' 4"  (1.626 m)   Wt 154 lb (69.9 kg)   SpO2 99%   BMI 26.43 kg/m         Assessment & Plan:  Lori Ross comes in today with chief complaint of Medical Management of Chronic Issues   Diagnosis and orders addressed:  1. Mixed hyperlipidemia Low fat diet exercise - atorvastatin (LIPITOR) 40 MG tablet; Take 1 tablet (40 mg total) by mouth daily.  Dispense: 90 tablet; Refill: 1 - fenofibrate 160 MG tablet; Take 1 tablet (160 mg total) by mouth daily.  Dispense: 90 tablet; Refill: 1 - CBC with Differential/Platelet - CMP14+EGFR - Lipid panel  2. Peripheral edema Elevate when sitting  3. Osteopenia of both hips Weight bearing exercise - DG [facility] DEXA  4. Vitamin D deficiency continue daily vitamin d supplement  5. BMI 26.0-26.9,adult Discussed diet and exercise for person with BMI >25 Will recheck weight in 3-6 months    Labs pending Health Maintenance reviewed Diet and exercise encouraged  Follow up plan: 6 months   [name], FNP

## 2020-04-23 DIAGNOSIS — Z780 Asymptomatic menopausal state: Secondary | ICD-10-CM | POA: Diagnosis not present

## 2020-04-23 DIAGNOSIS — M85852 Other specified disorders of bone density and structure, left thigh: Secondary | ICD-10-CM | POA: Diagnosis not present

## 2020-04-23 LAB — CBC WITH DIFFERENTIAL/PLATELET
Basophils Absolute: 0 10*3/uL (ref 0.0–0.2)
Basos: 1 %
EOS (ABSOLUTE): 0.2 10*3/uL (ref 0.0–0.4)
Eos: 4 %
Hematocrit: 43 % (ref 34.0–46.6)
Hemoglobin: 13.9 g/dL (ref 11.1–15.9)
Immature Grans (Abs): 0 10*3/uL (ref 0.0–0.1)
Immature Granulocytes: 0 %
Lymphocytes Absolute: 1.3 10*3/uL (ref 0.7–3.1)
Lymphs: 24 %
MCH: 28.7 pg (ref 26.6–33.0)
MCHC: 32.3 g/dL (ref 31.5–35.7)
MCV: 89 fL (ref 79–97)
Monocytes Absolute: 0.5 10*3/uL (ref 0.1–0.9)
Monocytes: 9 %
Neutrophils Absolute: 3.4 10*3/uL (ref 1.4–7.0)
Neutrophils: 62 %
Platelets: 386 10*3/uL (ref 150–450)
RBC: 4.85 x10E6/uL (ref 3.77–5.28)
RDW: 13.2 % (ref 11.7–15.4)
WBC: 5.6 10*3/uL (ref 3.4–10.8)

## 2020-04-23 LAB — CMP14+EGFR
ALT: 20 IU/L (ref 0–32)
AST: 24 IU/L (ref 0–40)
Albumin/Globulin Ratio: 1.7 (ref 1.2–2.2)
Albumin: 4.6 g/dL (ref 3.8–4.8)
Alkaline Phosphatase: 34 IU/L — ABNORMAL LOW (ref 44–121)
BUN/Creatinine Ratio: 18 (ref 12–28)
BUN: 19 mg/dL (ref 8–27)
Bilirubin Total: 0.3 mg/dL (ref 0.0–1.2)
CO2: 21 mmol/L (ref 20–29)
Calcium: 9.6 mg/dL (ref 8.7–10.3)
Chloride: 105 mmol/L (ref 96–106)
Creatinine, Ser: 1.04 mg/dL — ABNORMAL HIGH (ref 0.57–1.00)
GFR calc Af Amer: 63 mL/min/{1.73_m2} (ref >59)
GFR calc non Af Amer: 55 mL/min/{1.73_m2} — ABNORMAL LOW (ref >59)
Globulin, Total: 2.7 g/dL (ref 1.5–4.5)
Glucose: 93 mg/dL (ref 65–99)
Potassium: 5.1 mmol/L (ref 3.5–5.2)
Sodium: 144 mmol/L (ref 134–144)
Total Protein: 7.3 g/dL (ref 6.0–8.5)

## 2020-04-23 LAB — LIPID PANEL
Chol/HDL Ratio: 3.3 ratio (ref 0.0–4.4)
Cholesterol, Total: 155 mg/dL (ref 100–199)
HDL: 47 mg/dL (ref >39)
LDL Chol Calc (NIH): 90 mg/dL (ref 0–99)
Triglycerides: 95 mg/dL (ref 0–149)
VLDL Cholesterol Cal: 18 mg/dL (ref 5–40)

## 2020-04-25 DIAGNOSIS — H43813 Vitreous degeneration, bilateral: Secondary | ICD-10-CM | POA: Diagnosis not present

## 2020-04-25 DIAGNOSIS — D3132 Benign neoplasm of left choroid: Secondary | ICD-10-CM | POA: Diagnosis not present

## 2020-04-25 DIAGNOSIS — H52223 Regular astigmatism, bilateral: Secondary | ICD-10-CM | POA: Diagnosis not present

## 2020-04-25 DIAGNOSIS — H25813 Combined forms of age-related cataract, bilateral: Secondary | ICD-10-CM | POA: Diagnosis not present

## 2020-04-25 DIAGNOSIS — H5203 Hypermetropia, bilateral: Secondary | ICD-10-CM | POA: Diagnosis not present

## 2020-04-25 DIAGNOSIS — H524 Presbyopia: Secondary | ICD-10-CM | POA: Diagnosis not present

## 2020-04-25 DIAGNOSIS — H1045 Other chronic allergic conjunctivitis: Secondary | ICD-10-CM | POA: Diagnosis not present

## 2020-04-29 ENCOUNTER — Ambulatory Visit (INDEPENDENT_AMBULATORY_CARE_PROVIDER_SITE_OTHER): Payer: Medicare Other | Admitting: *Deleted

## 2020-04-29 VITALS — BP 135/86 | Ht 64.0 in | Wt 154.0 lb

## 2020-04-29 DIAGNOSIS — Z0000 Encounter for general adult medical examination without abnormal findings: Secondary | ICD-10-CM | POA: Diagnosis not present

## 2020-04-29 NOTE — Progress Notes (Signed)
MEDICARE ANNUAL WELLNESS VISIT  04/29/2020  Telephone Visit Disclaimer This Medicare AWV was conducted by telephone due to national recommendations for restrictions regarding the COVID-19 Pandemic (e.g. social distancing).  I verified, using two identifiers, that I am speaking with Lori Ross or their authorized healthcare agent. I discussed the limitations, risks, security, and privacy concerns of performing an evaluation and management service by telephone and the potential availability of an in-person appointment in the future. The patient expressed understanding and agreed to proceed.  Location of Patient: in her home Location of Provider (nurse):  In office  Subjective:    Lori Ross is a 70 y.o. female patient of Chevis Pretty, [place] who had a Medicare Annual Wellness Visit today via telephone. [name] is Retired and lives alone. she has 2 children. she reports that she is socially active and does interact with friends/family regularly. she is minimally physically active and enjoys sewing, knitting and gardening.  Patient Care Team: Chevis Pretty, FNP as PCP - General (Nurse Practitioner)  Advanced Directives 04/29/2020 04/25/2019 10/05/2017 10/27/2016 10/19/2016 09/16/2016  Does Patient Have a Medical Advance Directive? Yes Yes Yes Yes Yes Yes  Type of Advance Directive Living will;Healthcare Power of [place];Living will Living will Living will - Living will  Does patient want to make changes to medical advance directive? No - Patient declined - No - Patient declined No - Patient declined - -  Copy of [place] in Chart? Yes - validated most recent copy scanned in chart (See row information) No - copy requested - - No - copy requested [facility] Utilization Over the Past 12 Months: # of hospitalizations or ER visits: 0 # of surgeries: 0  Review of Systems    Patient reports that her overall health is  unchanged compared to last year.  General ROS: negative  Patient Reported Readings (BP, Pulse, CBG, Weight, etc) BP 135/86   Ht 5\' 4"  (1.626 m)   Wt 154 lb (69.9 kg)   BMI 26.43 kg/m    Pain Assessment       Current Medications & Allergies (verified) Allergies as of 04/29/2020   No Known Allergies     Medication List       Accurate as of April 29, 2020  9:06 AM. If you have any questions, ask your nurse or doctor.        atorvastatin 40 MG tablet Commonly known as: LIPITOR Take 1 tablet (40 mg total) by mouth daily.   calcium-vitamin D 500-200 MG-UNIT tablet Commonly known as: OSCAL WITH D Take 1 tablet by mouth daily.   fenofibrate 160 MG tablet Take 1 tablet (160 mg total) by mouth daily.   fish oil-omega-3 fatty acids 1000 MG capsule Take 2 g by mouth daily.   Vitamin D3 50 MCG (2000 UT) Tabs Take 1 tablet by mouth daily.       History (reviewed): Past Medical History:  Diagnosis Date  . Breast mass, right   . Breast mass, right 10/27/2016  . Hyperlipidemia   . Osteoporosis   . Vitamin D insufficiency    Past Surgical History:  Procedure Laterality Date  . BREAST EXCISIONAL BIOPSY Right    papiloma  . BREAST LUMPECTOMY WITH RADIOACTIVE SEED LOCALIZATION Right 10/27/2016   Procedure: RIGHT BREAST LUMPECTOMY WITH RADIOACTIVE SEED LOCALIZATION;  Surgeon: Fanny Skates, MD;  Location: [facility];  Service: General;  Laterality: Right;  . COLONOSCOPY  Family History  Problem Relation Age of Onset  . Cancer - Other Mother   . Stroke Father 18  . Hypertension Sister   . Stroke Sister 75  . Healthy Daughter   . Healthy Son    Social History   Socioeconomic History  . Marital status: Widowed    Spouse name: Not on file  . Number of children: 2  . Years of education: 65  . Highest education level: Associate degree: academic program  Occupational History  . 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 Smoking status: Never Smoker  . Smokeless tobacco: Never Used  Vaping Use  . Vaping Use: Never used  Substance and Sexual Activity  . Alcohol use: No  . Drug use: No  . Sexual activity: Not Currently    Birth control/protection: Post-menopausal  Other Topics Concern  . Not on file  Social History Narrative   Lives alone - 2 dogs 4 cats    Social Determinants of Health   Financial Resource Strain: Not on file  Food Insecurity: Not on file  Transportation Needs: Not on file  Physical Activity: Not on file  Stress: Not on file  Social Connections: Not on file    Activities of Daily Living In your present state of health, do you have any difficulty performing the following activities: 04/29/2020  Hearing? N  Vision? Y  Comment doing well - rx glasses  Difficulty concentrating or making decisions? N  Walking or climbing stairs? N  Dressing or bathing? N  Doing errands, shopping? N  Preparing Food and eating ? N  Using the Toilet? N  In the past six months, have you accidently leaked urine? N  Do you have problems with loss of bowel control? N  Managing your Medications? N  Managing your Finances? N  Housekeeping or managing your Housekeeping? N  Some recent data might be hidden    Patient Education/ Literacy    Exercise Current Exercise Habits: Home exercise routine, Type of exercise: walking, Time (Minutes): 20, Frequency (Times/Week): 3, Weekly Exercise (Minutes/Week): 60, Intensity: Mild, Exercise limited by: None identified  Diet Patient reports consuming 3 meals a day and 0 snack(s) a day Patient reports that her primary diet is: Regular Patient reports that she does have regular access to food.   Depression Screen PHQ 2/9 Scores 04/29/2020 04/22/2020 09/25/2019 04/25/2019 03/28/2019 09/20/2018 02/09/2018  PHQ - 2 Score 0 0 0 0 0 0 0  PHQ- 9 Score - - - 1 - - -     Fall Risk Fall Risk  04/29/2020 04/22/2020 09/25/2019 04/25/2019 03/28/2019  Falls in  the past year? 0 0 0 0 0  Comment - - - - -     Objective:  Lori Ross seemed alert and oriented and she participated appropriately during our telephone visit.  Blood Pressure Weight BMI  BP Readings from Last 3 Encounters:  04/29/20 135/86  04/22/20 135/86  09/25/19 128/78   Wt Readings from Last 3 Encounters:  04/29/20 154 lb (69.9 kg)  04/22/20 154 lb (69.9 kg)  09/25/19 144 lb (65.3 kg)   BMI Readings from Last 1 Encounters:  04/29/20 26.43 kg/m    *Unable to obtain current vital signs, weight, and BMI due to telephone visit type  Hearing/Vision  . Jericka did not seem to have difficulty with hearing/understanding during the telephone conversation . Reports that she has not had a formal eye exam by an eye care  professional within the past year . Reports that she has not had a formal hearing evaluation within the past year *Unable to fully assess hearing and vision during telephone visit type  Cognitive Function: 6CIT Screen 04/29/2020 04/25/2019  What Year? 0 points 0 points  What month? 0 points 0 points  What time? 0 points 0 points  Count back from 20 0 points 0 points  Months in reverse 0 points 0 points  Repeat phrase 0 points 0 points  Total Score 0 0   (Normal:0-7, Significant for Dysfunction: >8)  Normal Cognitive Function Screening: Yes   Immunization & Health Maintenance Record Immunization History  Administered Date(s) Administered  . Fluad Quad(high Dose 65+) 11/11/2018, 12/07/2019  . Influenza Split 12/29/2012  . Influenza, High Dose Seasonal PF 12/10/2016, 12/06/2017  . Influenza,inj,Quad PF,6+ Mos 12/12/2013, 12/14/2014  . Influenza-Unspecified 12/30/2015  . Moderna Sars-Covid-2 Vaccination 04/13/2019, 05/12/2019, 01/26/2020  . Pneumococcal Conjugate-13 03/30/2016  . Pneumococcal Polysaccharide-23 08/06/2017  . Td 08/16/2009  . Zoster 05/17/2013    Health Maintenance  Topic Date Due  . TETANUS/TDAP  04/29/2021 (Originally 08/17/2019)  .  MAMMOGRAM  10/04/2020  . DEXA SCAN  04/23/2022  . COLONOSCOPY (Pts 45-4yrs Insurance coverage will need to be confirmed)  02/15/2024  . INFLUENZA VACCINE  Completed  . COVID-19 Vaccine  Completed  . Hepatitis C Screening  Completed  . PNA vac Low Risk Adult  Completed       Assessment  This is a routine wellness examination for Lori Ross.  Health Maintenance: Due or Overdue There are no preventive care reminders to display for this patient.  Lori Ross does not need a referral for Community Assistance: Care Management:   no Social Work:    no Prescription Assistance:  no Nutrition/Diabetes Education:  no   Plan:  Personalized Goals Goals Addressed            This Visit's Progress   . awv   On track    04/25/2019 AWV Goal: Fall Prevention  . Over the next year, patient will decrease their risk for falls by: o Using assistive devices, such as a cane or walker, as needed o Identifying fall risks within their home and correcting them by: - Removing throw rugs - Adding handrails to stairs or ramps - Removing clutter and keeping a clear pathway throughout the home - Increasing light, especially at night - Adding shower handles/bars - Raising toilet seat o Identifying potential personal risk factors for falls: - Medication side effects - Incontinence/urgency - Vestibular dysfunction - Hearing loss - Musculoskeletal disorders - Neurological disorders - Orthostatic hypotension  04/25/2019 AWV Goal: Exercise for General Health   Patient will verbalize understanding of the benefits of increased physical activity:  Exercising regularly is important. It will improve your overall fitness, flexibility, and endurance.  Regular exercise also will improve your overall health. It can help you control your weight, reduce stress, and improve your bone density.  Over the next year, patient will increase physical activity as tolerated with a goal of at least 150  minutes of moderate physical activity per week.   You can tell that you are exercising at a moderate intensity if your heart starts beating faster and you start breathing faster but can still hold a conversation.  Moderate-intensity exercise ideas include:  Walking 1 mile (1.6 km) in about 15 minutes  Biking  Hiking  Golfing  Dancing  Water aerobics  Patient will verbalize understanding of everyday activities that increase  physical activity by providing examples like the following: ? Yard work, such as: ? Pushing a Conservation officer, nature ? Raking and bagging leaves ? Washing your car ? Pushing a stroller ? Shoveling snow ? Gardening ? Washing windows or floors  Patient will be able to explain general safety guidelines for exercising:   Before you start a new exercise program, talk with your health care provider.  Do not exercise so much that you hurt yourself, feel dizzy, or get very short of breath.  Wear comfortable clothes and wear shoes with good support.  Drink plenty of water while you exercise to prevent dehydration or heat stroke.  Work out until your breathing and your heartbeat get faster.     . Exercise 150 min/wk Moderate Activity   Not on track     Personalized Health Maintenance & Screening Recommendations  up to date   Lung Cancer Screening Recommended: no (Low Dose CT Chest recommended if Age 62-80 years, 30 pack-year currently smoking OR have quit w/in past 15 years) Hepatitis C Screening recommended: no HIV Screening recommended: no  Advanced Directives: Written information was not prepared per patient's request.  Referrals & Orders No orders of the defined types were placed in this encounter.   Follow-up Plan . Follow-up with Chevis Pretty, FNP as planned   I have personally reviewed and noted the following in the patient's chart:   . Medical and social history . Use of alcohol, tobacco or illicit drugs  . Current medications and  supplements . Functional ability and status . Nutritional status . Physical activity . Advanced directives . List of other physicians . Hospitalizations, surgeries, and ER visits in previous 12 months . Vitals . Screenings to include cognitive, depression, and falls . Referrals and appointments  In addition, I have reviewed and discussed with Lori Ross certain preventive protocols, quality metrics, and best practice recommendations. A written personalized care plan for preventive services as well as general preventive health recommendations is available and can be mailed to the patient at her request.      [place] Dec  04/29/2020

## 2020-04-29 NOTE — Patient Instructions (Signed)
  Lori Ross , Thank you for taking time to come for your Medicare Wellness Visit. I appreciate your ongoing commitment to your health goals. Please review the following plan we discussed and let me know if I can assist you in the future.   These are the goals we discussed: Goals    . awv     04/25/2019 AWV Goal: Fall Prevention  . Over the next year, patient will decrease their risk for falls by: o Using assistive devices, such as a cane or walker, as needed o Identifying fall risks within their home and correcting them by: - Removing throw rugs - Adding handrails to stairs or ramps - Removing clutter and keeping a clear pathway throughout the home - Increasing light, especially at night - Adding shower handles/bars - Raising toilet seat o Identifying potential personal risk factors for falls: - Medication side effects - Incontinence/urgency - Vestibular dysfunction - Hearing loss - Musculoskeletal disorders - Neurological disorders - Orthostatic hypotension  04/25/2019 AWV Goal: Exercise for General Health   Patient will verbalize understanding of the benefits of increased physical activity:  Exercising regularly is important. It will improve your overall fitness, flexibility, and endurance.  Regular exercise also will improve your overall health. It can help you control your weight, reduce stress, and improve your bone density.  Over the next year, patient will increase physical activity as tolerated with a goal of at least 150 minutes of moderate physical activity per week.   You can tell that you are exercising at a moderate intensity if your heart starts beating faster and you start breathing faster but can still hold a conversation.  Moderate-intensity exercise ideas include:  Walking 1 mile (1.6 km) in about 15 minutes  Biking  Hiking  Golfing  Dancing  Water aerobics  Patient will verbalize understanding of everyday activities that increase physical  activity by providing examples like the following: ? Yard work, such as: ? Pushing a Conservation officer, nature ? Raking and bagging leaves ? Washing your car ? Pushing a stroller ? Shoveling snow ? Gardening ? Washing windows or floors  Patient will be able to explain general safety guidelines for exercising:   Before you start a new exercise program, talk with your health care provider.  Do not exercise so much that you hurt yourself, feel dizzy, or get very short of breath.  Wear comfortable clothes and wear shoes with good support.  Drink plenty of water while you exercise to prevent dehydration or heat stroke.  Work out until your breathing and your heartbeat get faster.     . Exercise 150 min/wk Moderate Activity       This is a list of the screening recommended for you and due dates:  Health Maintenance  Topic Date Due  . Tetanus Vaccine  04/29/2021*  . Mammogram  10/04/2020  . DEXA scan (bone density measurement)  04/23/2022  . Colon Cancer Screening  02/15/2024  . Flu Shot  Completed  . COVID-19 Vaccine  Completed  .  Hepatitis C: One time screening is recommended by Center for Disease Control  (CDC) for  adults born from [birth year] through 1964.   Completed  . Pneumonia vaccines  Completed  *Topic was postponed. The date shown is not the original due date.

## 2020-08-20 ENCOUNTER — Ambulatory Visit (INDEPENDENT_AMBULATORY_CARE_PROVIDER_SITE_OTHER): Payer: Medicare Other | Admitting: Family Medicine

## 2020-08-20 ENCOUNTER — Encounter: Payer: Self-pay | Admitting: Family Medicine

## 2020-08-20 DIAGNOSIS — R42 Dizziness and giddiness: Secondary | ICD-10-CM

## 2020-08-20 DIAGNOSIS — H938X1 Other specified disorders of right ear: Secondary | ICD-10-CM | POA: Diagnosis not present

## 2020-08-20 MED ORDER — FLUTICASONE PROPIONATE 50 MCG/ACT NA SUSP: 2.0000 | Freq: Every day | NASAL | 0 refills | Status: DC

## 2020-08-20 MED ORDER — MECLIZINE HCL 12.5 MG PO TABS: 12.5000 mg | ORAL_TABLET | Freq: Three times a day (TID) | ORAL | 0 refills | Status: DC | PRN

## 2020-08-20 NOTE — Progress Notes (Signed)
Virtual Visit via Telephone Note  I connected with Lori Ross on 08/20/20 at 2:14 PM by telephone and verified that I am speaking with the correct person using two identifiers. Lori Ross is currently located at home and nobody is currently with her during this visit. The provider, Loman Brooklyn, FNP is located in their office at time of visit.  I discussed the limitations, risks, security and privacy concerns of performing an evaluation and management service by telephone and the availability of in person appointments. I also discussed with the patient that there may be a patient responsible charge related to this service. The patient expressed understanding and agreed to proceed.  Subjective: PCP: Chevis Pretty, FNP  Chief Complaint  Patient presents with   Dizziness   Patient reports yesterday during the day she had a few dizzy spells.  Last night they got worse and she was having trouble getting around.  Today she reports it is a little better.  She does report her right ear feels full and uncomfortable, but not painful.  Denies current allergy symptoms, but states recently she was experiencing some sneezing and runny nose.   ROS: Per HPI  Current Outpatient Medications:    atorvastatin (LIPITOR) 40 MG tablet, Take 1 tablet (40 mg total) by mouth daily., Disp: 90 tablet, Rfl: 1   calcium-vitamin D (OSCAL WITH D) 500-200 MG-UNIT per tablet, Take 1 tablet by mouth daily., Disp: , Rfl:    Cholecalciferol (VITAMIN D3) 2000 UNITS TABS, Take 1 tablet by mouth daily., Disp: , Rfl:    fenofibrate 160 MG tablet, Take 1 tablet (160 mg total) by mouth daily., Disp: 90 tablet, Rfl: 1   fish oil-omega-3 fatty acids 1000 MG capsule, Take 2 g by mouth daily., Disp: , Rfl:   No Known Allergies Past Medical History:  Diagnosis Date   Breast mass, right    Breast mass, right 10/27/2016   Hyperlipidemia    Osteoporosis    Vitamin D insufficiency      Observations/Objective: A&O  No respiratory distress or wheezing audible over the phone Mood, judgement, and thought processes all WNL   Assessment and Plan: 1-2. Dizziness/Abnormal sensation in right ear Discussed right ear being a probable cause of her dizziness.  Encouraged her to try using Flonase to see if this resolves her symptoms.  She is going to try to flush her ear out to make sure she does not have wax buildup.  Discussed if her symptoms do not improve she needs to be seen in person so that we can look in her ear. - fluticasone (FLONASE) 50 MCG/ACT nasal spray; Place 2 sprays into both nostrils daily.  Dispense: 16 g; Refill: 0 - meclizine (ANTIVERT) 12.5 MG tablet; Take 1 tablet (12.5 mg total) by mouth 3 (three) times daily as needed for dizziness.  Dispense: 30 tablet; Refill: 0   Follow Up Instructions:  I discussed the assessment and treatment plan with the patient. The patient was provided an opportunity to ask questions and all were answered. The patient agreed with the plan and demonstrated an understanding of the instructions.   The patient was advised to call back or seek an in-person evaluation if the symptoms worsen or if the condition fails to improve as anticipated.  The above assessment and management plan was discussed with the patient. The patient verbalized understanding of and has agreed to the management plan. Patient is aware to call the clinic if symptoms persist or worsen. Patient is  aware when to return to the clinic for a follow-up visit. Patient educated on when it is appropriate to go to the emergency department.   Time call ended: 2:25 PM  I provided 11 minutes of non-face-to-face time during this encounter.  Hendricks Limes, MSN, APRN, FNP-C [facility] 08/20/20

## 2020-08-22 ENCOUNTER — Other Ambulatory Visit: Payer: Self-pay

## 2020-08-22 ENCOUNTER — Encounter: Payer: Self-pay | Admitting: Family Medicine

## 2020-08-22 ENCOUNTER — Ambulatory Visit (INDEPENDENT_AMBULATORY_CARE_PROVIDER_SITE_OTHER): Payer: Medicare Other | Admitting: Family Medicine

## 2020-08-22 VITALS — BP 134/86 | HR 85 | Temp 97.9°F | Ht 64.0 in | Wt 144.2 lb

## 2020-08-22 DIAGNOSIS — R42 Dizziness and giddiness: Secondary | ICD-10-CM | POA: Diagnosis not present

## 2020-08-22 DIAGNOSIS — H938X1 Other specified disorders of right ear: Secondary | ICD-10-CM | POA: Diagnosis not present

## 2020-08-22 NOTE — Patient Instructions (Signed)
How to Perform the Epley Maneuver The Epley maneuver is an exercise that relieves symptoms of vertigo. Vertigo is the feeling that you or your surroundings are moving when they are not. When you feel vertigo, you may feel like the room is spinning and may have trouble walking. The Epley maneuver is used for a type of vertigo caused by a calcium deposit in a part of the inner ear. The maneuver involves changing headpositions to help the deposit move out of the area. You can do this maneuver at home whenever you have symptoms of vertigo. You canrepeat it in 24 hours if your vertigo has not gone away. Even though the Epley maneuver may relieve your vertigo for a few weeks, it is possible that your symptoms will return. This maneuver relieves vertigo, but itdoes not relieve dizziness. What are the risks? If it is done correctly, the Epley maneuver is considered safe. Sometimes it can lead to dizziness or nausea that goes away after a short time. If you develop other symptoms--such as changes in vision, weakness, or numbness--stopdoing the maneuver and call your health care provider. Supplies needed: A bed or table. A pillow. How to do the Epley maneuver     Sit on the edge of a bed or table with your back straight and your legs extended or hanging over the edge of the bed or table. Turn your head halfway toward the affected ear or side as told by your health care provider. Lie backward quickly with your head turned until you are lying flat on your back. Your head should dangle (head-hanging position). You may want to position a pillow under your shoulders. Hold this position for at least 30 seconds. If you feel dizzy or have symptoms of vertigo, continue to hold the position until the symptoms stop. Turn your head to the opposite direction until your unaffected ear is facing down. Your head should continue to dangle. Hold this position for at least 30 seconds. If you feel dizzy or have symptoms of  vertigo, continue to hold the position until the symptoms stop. Turn your whole body to the same side as your head so that you are positioned on your side. Your head will now be nearly facedown and no longer needs to dangle. Hold for at least 30 seconds. If you feel dizzy or have symptoms of vertigo, continue to hold the position until the symptoms stop. Sit back up. You can repeat the maneuver in 24 hours if your vertigo does not go away. Follow these instructions at home: For 24 hours after doing the Epley maneuver: Keep your head in an upright position. When lying down to sleep or rest, keep your head raised (elevated) with two or more pillows. Avoid excessive neck movements. Activity Do not drive or use machinery if you feel dizzy. After doing the Epley maneuver, return to your normal activities as told by your health care provider. Ask your health care provider what activities are safe for you. General instructions Drink enough fluid to keep your urine pale yellow. Do not drink alcohol. Take over-the-counter and prescription medicines only as told by your health care provider. Keep all follow-up visits. This is important. Preventing vertigo symptoms Ask your health care provider if there is anything you should do at home to prevent vertigo. He or she may recommend that you: Keep your head elevated with two or more pillows while you sleep. Do not sleep on the side of your affected ear. Get up slowly from bed.   Avoid sudden movements during the day. Avoid extreme head positions or movement, such as looking up or bending over. Contact a health care provider if: Your vertigo gets worse. You have other symptoms, including: Nausea. Vomiting. Headache. Get help right away if you: Have vision changes. Have a headache or neck pain that is severe or getting worse. Cannot stop vomiting. Have new numbness or weakness in any part of your body. These symptoms may represent a serious problem  that is an emergency. Do not wait to see if the symptoms will go away. Get medical help right away. Call your local emergency services (911 in the [place] Do not drive yourself to the hospital. Summary Vertigo is the feeling that you or your surroundings are moving when they are not. The Epley maneuver is an exercise that relieves symptoms of vertigo. If the Epley maneuver is done correctly, it is considered safe. This information is not intended to replace advice given to you by your health care provider. Make sure you discuss any questions you have with your healthcare provider. Document Revised: 01/24/2020 Document Reviewed: 01/24/2020 Elsevier Patient Education  2022 [employer]  

## 2020-08-22 NOTE — Progress Notes (Signed)
Assessment & Plan:  1-2. Dizziness/Abnormal sensation in right ear Increase Flonase to 2 sprays in each nare daily. Continue meclizine as needed. Education provided on the Epley maneuver.    Follow up plan: Return if symptoms worsen or fail to improve.  Hendricks Limes, MSN, APRN, FNP-C [facility]  Subjective:   Patient ID: Lori Ross, female    DOB: 05/02/1950, 70 y.o.   MRN: [id number]  HPI: Lori Ross is a 70 y.o. female presenting on 08/22/2020 for ears checked  (Patient states that her right ear feels uncomfortable x 4-5 days) and Dizziness (Had and phone visit on 6/14- states that meclizine helps but when it wears off then patient gets dizzy again )  Patient had a visit two days ago for dizziness and feeling full in her right ear. She was advised to try Flonase and meclizine. She has been using Flonase, but only 1 spray in each nare once daily. The meclizine does help, but when it wears off some of the dizziness returns.    ROS: Negative unless specifically indicated above in HPI.   Relevant past medical history reviewed and updated as indicated.   Allergies and medications reviewed and updated.   Current Outpatient Medications:    atorvastatin (LIPITOR) 40 MG tablet, Take 1 tablet (40 mg total) by mouth daily., Disp: 90 tablet, Rfl: 1   calcium-vitamin D (OSCAL WITH D) 500-200 MG-UNIT per tablet, Take 1 tablet by mouth daily., Disp: , Rfl:    Cholecalciferol (VITAMIN D3) 2000 UNITS TABS, Take 1 tablet by mouth daily., Disp: , Rfl:    fenofibrate 160 MG tablet, Take 1 tablet (160 mg total) by mouth daily., Disp: 90 tablet, Rfl: 1   fish oil-omega-3 fatty acids 1000 MG capsule, Take 2 g by mouth daily., Disp: , Rfl:    fluticasone (FLONASE) 50 MCG/ACT nasal spray, Place 2 sprays into both nostrils daily., Disp: 16 g, Rfl: 0   meclizine (ANTIVERT) 12.5 MG tablet, Take 1 tablet (12.5 mg total) by mouth 3 (three) times daily as needed for  dizziness., Disp: 30 tablet, Rfl: 0  No Known Allergies  Objective:   BP 134/86   Pulse 85   Temp 97.9 F (36.6 C) (Temporal)   Ht 5\' 4"  (1.626 m)   Wt 144 lb 3.2 oz (65.4 kg)   SpO2 96%   BMI 24.75 kg/m    Physical Exam Vitals reviewed.  Constitutional:      General: She is not in acute distress.    Appearance: Normal appearance. She is not ill-appearing, toxic-appearing or diaphoretic.  HENT:     Head: Normocephalic and atraumatic.     Right Ear: Tympanic membrane, ear canal and external ear normal. There is no impacted cerumen.     Left Ear: Tympanic membrane, ear canal and external ear normal. There is no impacted cerumen.  Eyes:     General: No scleral icterus.       Right eye: No discharge.        Left eye: No discharge.     Conjunctiva/sclera: Conjunctivae normal.  Cardiovascular:     Rate and Rhythm: Normal rate.  Pulmonary:     Effort: Pulmonary effort is normal. No respiratory distress.  Musculoskeletal:        General: Normal range of motion.     Cervical back: Normal range of motion.  Skin:    General: Skin is warm and dry.     Capillary Refill: Capillary refill takes less  than 2 seconds.  Neurological:     General: No focal deficit present.     Mental Status: She is alert and oriented to person, place, and time. Mental status is at baseline.  Psychiatric:        Mood and Affect: Mood normal.        Behavior: Behavior normal.        Thought Content: Thought content normal.        Judgment: Judgment normal.

## 2020-08-23 ENCOUNTER — Other Ambulatory Visit (HOSPITAL_COMMUNITY): Payer: Self-pay | Admitting: Nurse Practitioner

## 2020-08-23 DIAGNOSIS — Z1231 Encounter for screening mammogram for malignant neoplasm of breast: Secondary | ICD-10-CM

## 2020-09-24 ENCOUNTER — Other Ambulatory Visit: Payer: Self-pay

## 2020-09-24 ENCOUNTER — Encounter: Payer: Self-pay | Admitting: Nurse Practitioner

## 2020-09-24 ENCOUNTER — Ambulatory Visit (INDEPENDENT_AMBULATORY_CARE_PROVIDER_SITE_OTHER): Payer: Medicare Other | Admitting: Nurse Practitioner

## 2020-09-24 ENCOUNTER — Ambulatory Visit (INDEPENDENT_AMBULATORY_CARE_PROVIDER_SITE_OTHER): Payer: Medicare Other

## 2020-09-24 VITALS — BP 134/85 | HR 78 | Temp 98.3°F | Resp 20 | Ht 64.0 in | Wt 141.0 lb

## 2020-09-24 DIAGNOSIS — K5909 Other constipation: Secondary | ICD-10-CM

## 2020-09-24 DIAGNOSIS — R109 Unspecified abdominal pain: Secondary | ICD-10-CM | POA: Diagnosis not present

## 2020-09-24 DIAGNOSIS — R1031 Right lower quadrant pain: Secondary | ICD-10-CM

## 2020-09-24 NOTE — Progress Notes (Signed)
   Subjective:    Patient ID: Lori Ross, female    DOB: 1950-10-24, 70 y.o.   MRN: [id number]  Chief Complaint: Right side abdominal pain   HPI Patient comes in today c/o right sided abdominal pain. Describes pain as a tightening pain. Pain is always there and will change in intensity. Seems to be worse in the evening. Has been going on for a couple of weeks. Deneis fever, nausea or vomiting. She said she had pain years ago similar to this and she was told that her gall bladder was not emptying properly. She thinks pain was higher up then.    Review of Systems  Gastrointestinal:  Positive for abdominal pain. Negative for constipation, diarrhea, nausea and vomiting.  All other systems reviewed and are negative.     Objective:   Physical Exam Vitals and nursing note reviewed.  Constitutional:      Appearance: Normal appearance.  Cardiovascular:     Rate and Rhythm: Normal rate and regular rhythm.     Heart sounds: Normal heart sounds.  Pulmonary:     Effort: Pulmonary effort is normal.     Breath sounds: Normal breath sounds.  Abdominal:     General: Abdomen is flat. Bowel sounds are normal.     Palpations: Abdomen is soft.     Tenderness: There is abdominal tenderness (right lower quadrant).  Skin:    General: Skin is warm.  Neurological:     General: No focal deficit present.     Mental Status: She is alert.  Psychiatric:        Mood and Affect: Mood normal.        Behavior: Behavior normal.   BP 134/85   Pulse 78   Temp 98.3 F (36.8 C) (Temporal)   Resp 20   Ht 5\' 4"  (1.626 m)   Wt 141 lb (64 kg)   SpO2 98%   BMI 24.20 kg/m    KUB- moderate amount of stool in right ascending colon-Preliminary reading by Ronnald Collum, FNP  [facility]      Assessment & Plan:   [name] in today with chief complaint of Right side abdominal pain   1. Right lower quadrant pain - DG Abd 1 View  2. Other constipation Milk of magnesia and prune juice Increase fiber in  diet. Follow up prn    The above assessment and management plan was discussed with the patient. The patient verbalized understanding of and has agreed to the management plan. Patient is aware to call the clinic if symptoms persist or worsen. Patient is aware when to return to the clinic for a follow-up visit. Patient educated on when it is appropriate to go to the emergency department.   [name], FNP

## 2020-09-24 NOTE — Patient Instructions (Signed)

## 2020-09-30 ENCOUNTER — Other Ambulatory Visit: Payer: Self-pay | Admitting: Nurse Practitioner

## 2020-09-30 DIAGNOSIS — R102 Pelvic and perineal pain: Secondary | ICD-10-CM

## 2020-09-30 NOTE — Telephone Encounter (Signed)
Do  you need a xanax.

## 2020-10-14 ENCOUNTER — Other Ambulatory Visit: Payer: Self-pay

## 2020-10-14 ENCOUNTER — Ambulatory Visit (HOSPITAL_COMMUNITY)
Admission: RE | Admit: 2020-10-14 | Discharge: 2020-10-14 | Disposition: A | Discharge status: Home / Self Care | Payer: Medicare Other | Source: Ambulatory Visit | Attending: Nurse Practitioner | Admitting: Nurse Practitioner

## 2020-10-14 DIAGNOSIS — Z1231 Encounter for screening mammogram for malignant neoplasm of breast: Secondary | ICD-10-CM | POA: Insufficient documentation

## 2020-10-21 ENCOUNTER — Ambulatory Visit (INDEPENDENT_AMBULATORY_CARE_PROVIDER_SITE_OTHER): Payer: Medicare Other | Admitting: Nurse Practitioner

## 2020-10-21 ENCOUNTER — Other Ambulatory Visit: Payer: Self-pay

## 2020-10-21 ENCOUNTER — Encounter: Payer: Self-pay | Admitting: Nurse Practitioner

## 2020-10-21 VITALS — BP 129/78 | HR 77 | Temp 97.3°F | Resp 20 | Ht 64.0 in | Wt 138.0 lb

## 2020-10-21 DIAGNOSIS — R609 Edema, unspecified: Secondary | ICD-10-CM | POA: Diagnosis not present

## 2020-10-21 DIAGNOSIS — E559 Vitamin D deficiency, unspecified: Secondary | ICD-10-CM

## 2020-10-21 DIAGNOSIS — E782 Mixed hyperlipidemia: Secondary | ICD-10-CM | POA: Diagnosis not present

## 2020-10-21 DIAGNOSIS — M85851 Other specified disorders of bone density and structure, right thigh: Secondary | ICD-10-CM | POA: Diagnosis not present

## 2020-10-21 DIAGNOSIS — Z6823 Body mass index (BMI) 23.0-23.9, adult: Secondary | ICD-10-CM | POA: Diagnosis not present

## 2020-10-21 DIAGNOSIS — R1031 Right lower quadrant pain: Secondary | ICD-10-CM | POA: Diagnosis not present

## 2020-10-21 DIAGNOSIS — M85852 Other specified disorders of bone density and structure, left thigh: Secondary | ICD-10-CM | POA: Diagnosis not present

## 2020-10-21 LAB — CBC WITH DIFFERENTIAL/PLATELET
Basophils Absolute: 0 10*3/uL (ref 0.0–0.2)
Basos: 1 %
EOS (ABSOLUTE): 0.1 10*3/uL (ref 0.0–0.4)
Eos: 3 %
Hematocrit: 44.1 % (ref 34.0–46.6)
Hemoglobin: 14.3 g/dL (ref 11.1–15.9)
Immature Grans (Abs): 0 10*3/uL (ref 0.0–0.1)
Immature Granulocytes: 0 %
Lymphocytes Absolute: 1.8 10*3/uL (ref 0.7–3.1)
Lymphs: 38 %
MCH: 28.7 pg (ref 26.6–33.0)
MCHC: 32.4 g/dL (ref 31.5–35.7)
MCV: 89 fL (ref 79–97)
Monocytes Absolute: 0.4 10*3/uL (ref 0.1–0.9)
Monocytes: 8 %
Neutrophils Absolute: 2.4 10*3/uL (ref 1.4–7.0)
Neutrophils: 50 %
Platelets: 348 10*3/uL (ref 150–450)
RBC: 4.98 x10E6/uL (ref 3.77–5.28)
RDW: 13.8 % (ref 11.7–15.4)
WBC: 4.8 10*3/uL (ref 3.4–10.8)

## 2020-10-21 LAB — LIPID PANEL
Chol/HDL Ratio: 3.6 ratio (ref 0.0–4.4)
Cholesterol, Total: 156 mg/dL (ref 100–199)
HDL: 43 mg/dL (ref >39)
LDL Chol Calc (NIH): 90 mg/dL (ref 0–99)
Triglycerides: 131 mg/dL (ref 0–149)
VLDL Cholesterol Cal: 23 mg/dL (ref 5–40)

## 2020-10-21 LAB — CMP14+EGFR
ALT: 13 IU/L (ref 0–32)
AST: 19 IU/L (ref 0–40)
Albumin/Globulin Ratio: 1.7 (ref 1.2–2.2)
Albumin: 4.6 g/dL (ref 3.8–4.8)
Alkaline Phosphatase: 32 IU/L — ABNORMAL LOW (ref 44–121)
BUN/Creatinine Ratio: 20 (ref 12–28)
BUN: 21 mg/dL (ref 8–27)
Bilirubin Total: 0.5 mg/dL (ref 0.0–1.2)
CO2: 24 mmol/L (ref 20–29)
Calcium: 10.2 mg/dL (ref 8.7–10.3)
Chloride: 105 mmol/L (ref 96–106)
Creatinine, Ser: 1.05 mg/dL — ABNORMAL HIGH (ref 0.57–1.00)
Globulin, Total: 2.7 g/dL (ref 1.5–4.5)
Glucose: 87 mg/dL (ref 65–99)
Potassium: 4.7 mmol/L (ref 3.5–5.2)
Sodium: 142 mmol/L (ref 134–144)
Total Protein: 7.3 g/dL (ref 6.0–8.5)
eGFR: 58 mL/min/{1.73_m2} — ABNORMAL LOW (ref >59)

## 2020-10-21 MED ORDER — ATORVASTATIN CALCIUM 40 MG PO TABS: 40.0000 mg | ORAL_TABLET | Freq: Every day | ORAL | 1 refills | Status: DC

## 2020-10-21 MED ORDER — FENOFIBRATE 160 MG PO TABS: 160.0000 mg | ORAL_TABLET | Freq: Every day | ORAL | 1 refills | Status: DC

## 2020-10-21 NOTE — Progress Notes (Signed)
Subjective:    Patient ID: Lori Ross, female    DOB: May 09, 1950, 70 y.o.   MRN: [id number]  Chief Complaint: Medical Management of Chronic Issues    HPI:  1. Mixed hyperlipidemia Tries to avoid fatty foods. Lab Results  Component Value Date   CHOL 155 04/22/2020   HDL 47 04/22/2020   LDLCALC 90 04/22/2020   TRIG 95 04/22/2020   CHOLHDL 3.3 04/22/2020     2. Peripheral edema No recent edema to speak of.  3. Osteopenia of both hips Last dexascan as done 04/23/20 with t score of -2.2. She has not been exercising lately.  4. Vitamin D deficiency Takes daily vitamin d supplement  5. BMI 24.0-24.9,adult Weight is down 3lbs from previous Wt Readings from Last 3 Encounters:  10/21/20 138 lb (62.6 kg)  09/24/20 141 lb (64 kg)  08/22/20 144 lb 3.2 oz (65.4 kg)   BMI Readings from Last 3 Encounters:  10/21/20 23.69 kg/m  09/24/20 24.20 kg/m  08/22/20 24.75 kg/m       Outpatient Encounter Medications as of 10/21/2020  Medication Sig   atorvastatin (LIPITOR) 40 MG tablet Take 1 tablet (40 mg total) by mouth daily.   calcium-vitamin D (OSCAL WITH D) 500-200 MG-UNIT per tablet Take 1 tablet by mouth daily.   Cholecalciferol (VITAMIN D3) 2000 UNITS TABS Take 1 tablet by mouth daily.   fenofibrate 160 MG tablet Take 1 tablet (160 mg total) by mouth daily.   fish oil-omega-3 fatty acids 1000 MG capsule Take 2 g by mouth daily.   [DISCONTINUED] meclizine (ANTIVERT) 12.5 MG tablet Take 1 tablet (12.5 mg total) by mouth 3 (three) times daily as needed for dizziness.   No facility-administered encounter medications on file as of 10/21/2020.    Past Surgical History:  Procedure Laterality Date   BREAST EXCISIONAL BIOPSY Right    papiloma   BREAST LUMPECTOMY WITH RADIOACTIVE SEED LOCALIZATION Right 10/27/2016   Procedure: RIGHT BREAST LUMPECTOMY WITH RADIOACTIVE SEED LOCALIZATION;  Surgeon: Fanny Skates, MD;  Location: [place];  Service: General;   Laterality: Right;   COLONOSCOPY      Family History  Problem Relation Age of Onset   Cancer - Other Mother    Stroke Father 42   Hypertension Sister    Stroke Sister 40   Healthy Daughter    Healthy Son     New complaints: Still having occasional abdominal pain she has a CT scheduled for 10/29/20  Social history: Lives by herself with her dogs and cats  Controlled substance contract: n/a     Review of Systems  Constitutional:  Negative for diaphoresis.  Eyes:  Negative for pain.  Respiratory:  Negative for shortness of breath.   Cardiovascular:  Negative for chest pain, palpitations and leg swelling.  Gastrointestinal:  Positive for abdominal pain. Negative for constipation, diarrhea, nausea and vomiting.  Endocrine: Negative for polydipsia.  Skin:  Negative for rash.  Neurological:  Negative for dizziness, weakness and headaches.  Hematological:  Does not bruise/bleed easily.  All other systems reviewed and are negative.     Objective:   Physical Exam Vitals and nursing note reviewed.  Constitutional:      General: She is not in acute distress.    Appearance: Normal appearance. She is well-developed.  HENT:     Head: Normocephalic.     Right Ear: Tympanic membrane normal.     Left Ear: Tympanic membrane normal.     Nose: Nose normal.  Mouth/Throat:     Mouth: Mucous membranes are moist.  Eyes:     Pupils: Pupils are equal, round, and reactive to light.  Neck:     Vascular: No carotid bruit or JVD.  Cardiovascular:     Rate and Rhythm: Normal rate and regular rhythm.     Heart sounds: Normal heart sounds.  Pulmonary:     Effort: Pulmonary effort is normal. No respiratory distress.     Breath sounds: Normal breath sounds. No wheezing or rales.  Chest:     Chest wall: No tenderness.  Abdominal:     General: Bowel sounds are normal. There is no distension or abdominal bruit.     Palpations: Abdomen is soft. There is no hepatomegaly, splenomegaly, mass  or pulsatile mass.     Tenderness: There is abdominal tenderness (RLQ).  Musculoskeletal:        General: Normal range of motion.     Cervical back: Normal range of motion and neck supple.  Lymphadenopathy:     Cervical: No cervical adenopathy.  Skin:    General: Skin is warm and dry.  Neurological:     Mental Status: She is alert and oriented to person, place, and time.     Deep Tendon Reflexes: Reflexes are normal and symmetric.  Psychiatric:        Behavior: Behavior normal.        Thought Content: Thought content normal.        Judgment: Judgment normal.    BP 129/78   Pulse 77   Temp (!) 97.3 F (36.3 C) (Temporal)   Resp 20   Ht 5' 4"  (1.626 m)   Wt 138 lb (62.6 kg)   SpO2 98%   BMI 23.69 kg/m        Assessment & Plan:  Lori Ross comes in today with chief complaint of Medical Management of Chronic Issues   Diagnosis and orders addressed:  1. Mixed hyperlipidemia Low fat diet - atorvastatin (LIPITOR) 40 MG tablet; Take 1 tablet (40 mg total) by mouth daily.  Dispense: 90 tablet; Refill: 1 - fenofibrate 160 MG tablet; Take 1 tablet (160 mg total) by mouth daily.  Dispense: 90 tablet; Refill: 1 - CBC with Differential/Platelet - CMP14+EGFR - Lipid panel  2. Peripheral edema Elevate legs when selling  3. Osteopenia of both hips Weight bearing exercises encouraged  4. Vitamin D deficiency Continue dialy vitamind supplement  5. BMI 23.0-23.9, adult Discussed diet and exercise for person with BMI >25 Will recheck weight in 3-6 months  6. Right lower quadrant abdominal pain Keep appointment for ct scan   Labs pending Health Maintenance reviewed Diet and exercise encouraged  Follow up plan: Keep appointment for CT scan   [name], FNP

## 2020-10-29 ENCOUNTER — Other Ambulatory Visit: Payer: Self-pay

## 2020-10-29 ENCOUNTER — Ambulatory Visit (HOSPITAL_COMMUNITY)
Admission: RE | Admit: 2020-10-29 | Discharge: 2020-10-29 | Disposition: A | Discharge status: Home / Self Care | Payer: Medicare Other | Source: Ambulatory Visit | Attending: Nurse Practitioner | Admitting: Nurse Practitioner

## 2020-10-29 DIAGNOSIS — K429 Umbilical hernia without obstruction or gangrene: Secondary | ICD-10-CM | POA: Diagnosis not present

## 2020-10-29 DIAGNOSIS — N281 Cyst of kidney, acquired: Secondary | ICD-10-CM | POA: Diagnosis not present

## 2020-10-29 DIAGNOSIS — K439 Ventral hernia without obstruction or gangrene: Secondary | ICD-10-CM | POA: Diagnosis not present

## 2020-10-29 DIAGNOSIS — R102 Pelvic and perineal pain: Secondary | ICD-10-CM | POA: Insufficient documentation

## 2020-10-29 DIAGNOSIS — K7689 Other specified diseases of liver: Secondary | ICD-10-CM | POA: Diagnosis not present

## 2020-10-30 ENCOUNTER — Other Ambulatory Visit: Payer: Self-pay | Admitting: Nurse Practitioner

## 2020-10-30 DIAGNOSIS — K439 Ventral hernia without obstruction or gangrene: Secondary | ICD-10-CM

## 2020-10-30 NOTE — Progress Notes (Signed)
Patient aware and verbalized understanding. °

## 2020-10-30 NOTE — Progress Notes (Unsigned)
General surgery referral made to Dr. Constance Haw at [facility]. You will love her

## 2020-11-14 ENCOUNTER — Encounter: Payer: Self-pay | Admitting: General Surgery

## 2020-11-14 ENCOUNTER — Ambulatory Visit (INDEPENDENT_AMBULATORY_CARE_PROVIDER_SITE_OTHER): Payer: Medicare Other | Admitting: General Surgery

## 2020-11-14 ENCOUNTER — Other Ambulatory Visit: Payer: Self-pay

## 2020-11-14 VITALS — BP 144/84 | HR 85 | Temp 98.5°F | Resp 16 | Ht 64.0 in | Wt 134.0 lb

## 2020-11-14 DIAGNOSIS — K439 Ventral hernia without obstruction or gangrene: Secondary | ICD-10-CM

## 2020-11-14 NOTE — Progress Notes (Signed)
[facility] History and Physical  Reason for Referral: Ventral hernia  Referring Physician: Chevis Pretty, FNP   Chief Complaint   New Patient (Initial Visit)     Lori Ross is a 70 y.o. female.  HPI: Lori Ross comes in with RLQ pain that is intermittent and is not really severe but is noticeable in nature. She has chronic constipation but has noted any major changes. She has no prior abdominal surgery. She denies ever feeling any hard bulge or pain that did not improve on its on. The pain is worse when she is moving around and she had a CT that demonstrated a hernia in the RLQ consistent with Spigelian given that it is lateral to the rectus muscle. It is too high to be an inguinal hernia.   Past Medical History:  Diagnosis Date   Breast mass, right    Breast mass, right 10/27/2016   Hyperlipidemia    Osteoporosis    Vitamin D insufficiency     Past Surgical History:  Procedure Laterality Date   BREAST EXCISIONAL BIOPSY Right    papiloma   BREAST LUMPECTOMY WITH RADIOACTIVE SEED LOCALIZATION Right 10/27/2016   Procedure: RIGHT BREAST LUMPECTOMY WITH RADIOACTIVE SEED LOCALIZATION;  Surgeon: Fanny Skates, MD;  Location: [place];  Service: General;  Laterality: Right;   COLONOSCOPY      Family History  Problem Relation Age of Onset   Cancer - Other Mother    Stroke Father 77   Hypertension Sister    Stroke Sister 23   Healthy Daughter    Healthy Son     Social History   Tobacco Use   Smoking status: Never   Smokeless tobacco: Never  Vaping Use   Vaping Use: Never used  Substance Use Topics   Alcohol use: No   Drug use: No    Medications: I have reviewed the patient's current medications. Allergies as of 11/14/2020   No Known Allergies      Medication List        Accurate as of November 14, 2020  1:33 PM. If you have any questions, ask your nurse or doctor.          atorvastatin 40 MG tablet Commonly  known as: LIPITOR Take 1 tablet (40 mg total) by mouth daily.   calcium-vitamin D 500-200 MG-UNIT tablet Commonly known as: OSCAL WITH D Take 1 tablet by mouth daily.   fenofibrate 160 MG tablet Take 1 tablet (160 mg total) by mouth daily.   fish oil-omega-3 fatty acids 1000 MG capsule Take 2 g by mouth daily.   Vitamin D3 50 MCG (2000 UT) Tabs Take 1 tablet by mouth daily.         ROS:  A comprehensive review of systems was negative except for: Gastrointestinal: positive for abdominal pain and constipation  Blood pressure (!) 144/84, pulse 85, temperature 98.5 F (36.9 C), temperature source Other (Comment), resp. rate 16, height '5\' 4"'$  (1.626 m), weight 134 lb (60.8 kg), SpO2 97 %. Physical Exam Vitals reviewed.  Constitutional:      Appearance: Normal appearance.  HENT:     Head: Normocephalic.     Nose: Nose normal.     Mouth/Throat:     Mouth: Mucous membranes are moist.  Eyes:     Extraocular Movements: Extraocular movements intact.  Cardiovascular:     Rate and Rhythm: Normal rate and regular rhythm.  Pulmonary:     Effort: Pulmonary effort is normal.  Breath sounds: Normal breath sounds.  Abdominal:     General: There is no distension.     Palpations: Abdomen is soft.     Tenderness: There is abdominal tenderness.     Hernia: A hernia is present.     Comments: Lower right abdomen with hernia noted, reducible, minor tenderness  Musculoskeletal:        General: Normal range of motion.     Cervical back: Normal range of motion.  Skin:    General: Skin is warm.  Neurological:     General: No focal deficit present.     Mental Status: She is alert and oriented to person, place, and time.  Psychiatric:        Mood and Affect: Mood normal.        Behavior: Behavior normal.        Thought Content: Thought content normal.    Results: Personally reviewed- 3cm fascia defect lateral to rectus, spigelian type hernia, colon and SB in the hernia but no  obstruction  CLINICAL DATA:  Right lower quadrant abdominal pain.   EXAM: CT ABDOMEN AND PELVIS WITHOUT CONTRAST   TECHNIQUE: Multidetector CT imaging of the abdomen and pelvis was performed following the standard protocol without IV contrast.   COMPARISON:  None.   FINDINGS: Lower chest: No acute abnormality.   Hepatobiliary: No cholelithiasis or biliary dilatation is noted. Multiple small rounded hypodensities are noted in the hepatic parenchyma most likely representing cysts.   Pancreas: Unremarkable. No pancreatic ductal dilatation or surrounding inflammatory changes.   Spleen: Normal in size without focal abnormality.   Adrenals/Urinary Tract: Adrenal glands appear normal. Right renal cyst is noted. No hydronephrosis or renal obstruction is noted. No renal or ureteral calculi are noted. Urinary bladder is unremarkable.   Stomach/Bowel: The stomach appears normal. There is no evidence of bowel obstruction or inflammation. Large ventral hernia is noted in the right lower quadrant which contains a portion of the terminal ileum and cecum, but does not result in obstruction.   Vascular/Lymphatic: Aortic atherosclerosis. No enlarged abdominal or pelvic lymph nodes.   Reproductive: Uterus and bilateral adnexa are unremarkable.   Other: No ascites is noted. Small fat containing periumbilical hernia is noted.   Musculoskeletal: No acute or significant osseous findings.   IMPRESSION: Large right lower quadrant ventral hernia is noted which contains a portion of the terminal ileum and cecum, but does not result in obstruction.   Probable hepatic cysts.   Aortic Atherosclerosis (ICD10-I70.0).     Electronically Signed   By: Marijo Conception M.D.   On: 10/30/2020 08:04    Assessment & Plan:  Lori Ross is a 71 y.o. female with spigelian ventral hernia in the RLQ with discomfort. Discussed laparoscopic repair to ensure good coverage of the 3cm defect. Discussed  risk of bleeding ,infection, mesh use, recurrence, injury to bowel, and seroma formation, possible open procedure.   She wants to proceed.   All questions were answered to the satisfaction of the patient.     Virl Cagey 11/14/2020, 1:33 PM

## 2020-11-14 NOTE — Patient Instructions (Signed)
Laparoscopic Ventral Hernia Repair Laparoscopic ventral hernia repairis a procedure to fix a bulge of tissue that pushes through a weak area of muscle in the abdomen (ventral hernia). A ventral hernia may be: Above the belly button. This is called an epigastric hernia. At the belly button. This is called an umbilical hernia. At the incision site from previous abdominal surgery. This is called an incisional hernia. You may have this procedure as emergency surgery if part of your intestine gets trapped inside the hernia and starts to lose its blood supply (strangulation). Laparoscopic surgery is done through small incisions using a thin surgical telescope with a light and camera (laparoscope). During surgery, your surgeon will use images from the laparoscope to guide the procedure. A mesh screen will be placed in the hernia to close the opening and strengthen the abdominal wall. Tell a health care provider about: Any allergies you have. All medicines you are taking, including vitamins, herbs, eye drops, creams, and over-the-counter medicines. Any problems you or family members have had with anesthetic medicines. Any blood disorders you have. Any surgeries you have had. Any medical conditions you have. Whether you are pregnant or may be pregnant. What are the risks? Generally, this is a safe procedure. However, problems may occur, including: Infection. Bleeding. Damage to nearby structures or organs in the abdomen. Trouble urinating or having a bowel movement after surgery. Blood clots. The hernia coming back after surgery. Fluid buildup in the area of the hernia. In some cases, your health care provider may need to switch from a laparoscopic procedure to a procedure that is done through a single, larger incision in the abdomen (open procedure). You may need an open procedure if: You have a hernia that is difficult to repair. Your organs are hard to see with the laparoscope. You have bleeding  problems during the laparoscopic procedure. What happens before the procedure? Medicines Ask your health care provider about: Changing or stopping your regular medicines. This is especially important if you are taking diabetes medicines or blood thinners. Taking medicines such as aspirin and ibuprofen. These medicines can thin your blood. Do not take these medicines unless your health care provider tells you to take them. Taking over-the-counter medicines, vitamins, herbs, and supplements. Tests You may need to have tests before the procedure, such as: Blood tests. Urine tests. Abdominal ultrasound. Chest X-ray. Electrocardiogram (ECG). General instructions You may be asked to take a laxative or do an enema to empty your bowel before surgery (bowel prep). Do not use any products that contain nicotine or tobacco for at least 4 weeks before the procedure. These products include cigarettes, chewing tobacco, and vaping devices, such as e-cigarettes. If you need help quitting, ask your health care provider. Ask your health care provider: How your surgery site will be marked. What steps will be taken to help prevent infection. These steps may include: Removing hair at the surgery site. Washing skin with a germ-killing soap. Receiving antibiotic medicine. Plan to have a responsible adult take you home from the hospital or clinic. If you will be going home right after the procedure, plan to have a responsible adult care for you for the time you are told. This is important. What happens during the procedure?  An IV will be inserted into one of your veins. You will be given one or more of the following: A medicine to help you relax (sedative). A medicine to numb the area (local anesthetic). A medicine to make you fall asleep (general anesthetic).   A small incision will be made in your abdomen. A hollow metal tube (trocar) will be placed through the incision. A tube will be placed through the  trocar to inflate your abdomen with carbon dioxide. This makes it easier for your surgeon to see inside your abdomen during the repair. A laparoscope will be inserted into your abdomen through the trocar. The laparoscope will send images to a monitor in the operating room. Other trocars will be put through other small incisions in your abdomen. The surgical instruments needed for the procedure will be placed through these trocars. The tissue or intestines that make up the hernia will be moved back into place. The edges of the hernia may be stitched (sutured) together. A piece of mesh will be used to close the hernia. Sutures, clips, or staples will be used to keep the mesh in place. A bandage (dressing) or skin glue will be put over the incisions. The procedure may vary among health care providers and hospitals. What happens after the procedure? Your blood pressure, heart rate, breathing rate, and blood oxygen level will be monitored until you leave the hospital or clinic. You will continue to receive fluids and medicines through an IV. Your IV will be removed when you can drink clear fluids. You will be given pain medicine as needed. You will be encouraged to get up and walk around as soon as possible. You will be shown how to do deep breathing exercises to help prevent a lung infection. If you were given a sedative during the procedure, it can affect you for several hours. Do not drive or operate machinery until your health care provider says that it is safe. Summary Laparoscopic ventral hernia is an operation to fix a hernia using small incisions. Tell your health care provider about other medical conditions that you have and about all the medicines that you are taking. Follow instructions from your health care provider about eating and drinking before the procedure. Plan to have a responsible adult take you home from the hospital or clinic. After the procedure, you will be encouraged to walk  as soon as possible. You will also be taught how to do deep breathing exercises. This information is not intended to replace advice given to you by your health care provider. Make sure you discuss any questions you have with your health care provider. Document Revised: 10/13/2019 Document Reviewed: 10/13/2019 Elsevier Patient Education  2022 [employer]  

## 2020-11-15 NOTE — H&P (Signed)
[facility] History and Physical  Reason for Referral: Ventral hernia  Referring Physician: Chevis Pretty, FNP   Chief Complaint   New Patient (Initial Visit)     Lori Ross is a 70 y.o. female.  HPI: Lori Ross comes in with RLQ pain that is intermittent and is not really severe but is noticeable in nature. She has chronic constipation but has noted any major changes. She has no prior abdominal surgery. She denies ever feeling any hard bulge or pain that did not improve on its on. The pain is worse when she is moving around and she had a CT that demonstrated a hernia in the RLQ consistent with Spigelian given that it is lateral to the rectus muscle. It is too high to be an inguinal hernia.   Past Medical History:  Diagnosis Date   Breast mass, right    Breast mass, right 10/27/2016   Hyperlipidemia    Osteoporosis    Vitamin D insufficiency     Past Surgical History:  Procedure Laterality Date   BREAST EXCISIONAL BIOPSY Right    papiloma   BREAST LUMPECTOMY WITH RADIOACTIVE SEED LOCALIZATION Right 10/27/2016   Procedure: RIGHT BREAST LUMPECTOMY WITH RADIOACTIVE SEED LOCALIZATION;  Surgeon: Fanny Skates, MD;  Location: [facility];  Service: General;  Laterality: Right;   COLONOSCOPY      Family History  Problem Relation Age of Onset   Cancer - Other Mother    Stroke Father 13   Hypertension Sister    Stroke Sister 36   Healthy Daughter    Healthy Son     Social History   Tobacco Use   Smoking status: Never   Smokeless tobacco: Never  Vaping Use   Vaping Use: Never used  Substance Use Topics   Alcohol use: No   Drug use: No    Medications: I have reviewed the patient's current medications. Allergies as of 11/14/2020   No Known Allergies      Medication List        Accurate as of November 14, 2020  1:33 PM. If you have any questions, ask your nurse or doctor.          atorvastatin 40 MG tablet Commonly  known as: LIPITOR Take 1 tablet (40 mg total) by mouth daily.   calcium-vitamin D 500-200 MG-UNIT tablet Commonly known as: OSCAL WITH D Take 1 tablet by mouth daily.   fenofibrate 160 MG tablet Take 1 tablet (160 mg total) by mouth daily.   fish oil-omega-3 fatty acids 1000 MG capsule Take 2 g by mouth daily.   Vitamin D3 50 MCG (2000 UT) Tabs Take 1 tablet by mouth daily.         ROS:  A comprehensive review of systems was negative except for: Gastrointestinal: positive for abdominal pain and constipation  Blood pressure (!) 144/84, pulse 85, temperature 98.5 F (36.9 C), temperature source Other (Comment), resp. rate 16, height '5\' 4"'$  (1.626 m), weight 134 lb (60.8 kg), SpO2 97 %. Physical Exam Vitals reviewed.  Constitutional:      Appearance: Normal appearance.  HENT:     Head: Normocephalic.     Nose: Nose normal.     Mouth/Throat:     Mouth: Mucous membranes are moist.  Eyes:     Extraocular Movements: Extraocular movements intact.  Cardiovascular:     Rate and Rhythm: Normal rate and regular rhythm.  Pulmonary:     Effort: Pulmonary effort is normal.  Breath sounds: Normal breath sounds.  Abdominal:     General: There is no distension.     Palpations: Abdomen is soft.     Tenderness: There is abdominal tenderness.     Hernia: A hernia is present.     Comments: Lower right abdomen with hernia noted, reducible, minor tenderness  Musculoskeletal:        General: Normal range of motion.     Cervical back: Normal range of motion.  Skin:    General: Skin is warm.  Neurological:     General: No focal deficit present.     Mental Status: She is alert and oriented to person, place, and time.  Psychiatric:        Mood and Affect: Mood normal.        Behavior: Behavior normal.        Thought Content: Thought content normal.    Results: Personally reviewed- 3cm fascia defect lateral to rectus, spigelian type hernia, colon and SB in the hernia but no  obstruction  CLINICAL DATA:  Right lower quadrant abdominal pain.   EXAM: CT ABDOMEN AND PELVIS WITHOUT CONTRAST   TECHNIQUE: Multidetector CT imaging of the abdomen and pelvis was performed following the standard protocol without IV contrast.   COMPARISON:  None.   FINDINGS: Lower chest: No acute abnormality.   Hepatobiliary: No cholelithiasis or biliary dilatation is noted. Multiple small rounded hypodensities are noted in the hepatic parenchyma most likely representing cysts.   Pancreas: Unremarkable. No pancreatic ductal dilatation or surrounding inflammatory changes.   Spleen: Normal in size without focal abnormality.   Adrenals/Urinary Tract: Adrenal glands appear normal. Right renal cyst is noted. No hydronephrosis or renal obstruction is noted. No renal or ureteral calculi are noted. Urinary bladder is unremarkable.   Stomach/Bowel: The stomach appears normal. There is no evidence of bowel obstruction or inflammation. Large ventral hernia is noted in the right lower quadrant which contains a portion of the terminal ileum and cecum, but does not result in obstruction.   Vascular/Lymphatic: Aortic atherosclerosis. No enlarged abdominal or pelvic lymph nodes.   Reproductive: Uterus and bilateral adnexa are unremarkable.   Other: No ascites is noted. Small fat containing periumbilical hernia is noted.   Musculoskeletal: No acute or significant osseous findings.   IMPRESSION: Large right lower quadrant ventral hernia is noted which contains a portion of the terminal ileum and cecum, but does not result in obstruction.   Probable hepatic cysts.   Aortic Atherosclerosis (ICD10-I70.0).     Electronically Signed   By: Marijo Conception M.D.   On: 10/30/2020 08:04    Assessment & Plan:  Lori Ross is a 70 y.o. female with spigelian ventral hernia in the RLQ with discomfort. Discussed laparoscopic repair to ensure good coverage of the 3cm defect. Discussed  risk of bleeding ,infection, mesh use, recurrence, injury to bowel, and seroma formation, possible open procedure.   She wants to proceed.   All questions were answered to the satisfaction of the patient.     Virl Cagey 11/14/2020, 1:33 PM

## 2020-11-20 NOTE — Patient Instructions (Signed)
Your procedure is scheduled on: 11/27/2020  Report to [facility] at    7:15 AM.  Call this number if you have problems the morning of surgery: [phone]   Remember:   Do not Eat or Drink after midnight         No Smoking the morning of surgery  :  Take these medicines the morning of surgery with A SIP OF WATER: none   Do not wear jewelry, make-up or nail polish.  Do not wear lotions, powders, or perfumes. You may wear deodorant.  Do not shave 48 hours prior to surgery. Men may shave face and neck.  Do not bring valuables to the hospital.  Contacts, dentures or bridgework may not be worn into surgery.  Leave suitcase in the car. After surgery it may be brought to your room.  For patients admitted to the hospital, checkout time is 11:00 AM the day of discharge.   Patients discharged the day of surgery will not be allowed to drive home.    Special Instructions: Shower using CHG night before surgery and shower the day of surgery use CHG.  Use special wash - you have one bottle of CHG for all showers.  You should use approximately 1/2 of the bottle for each shower.  How to Use Chlorhexidine for Bathing Chlorhexidine gluconate (CHG) is a germ-killing (antiseptic) solution that is used to clean the skin. It can get rid of the bacteria that normally live on the skin and can keep them away for about 24 hours. To clean your skin with CHG, you may be given: A CHG solution to use in the shower or as part of a sponge bath. A prepackaged cloth that contains CHG. Cleaning your skin with CHG may help lower the risk for infection: While you are staying in the intensive care unit of the hospital. If you have a vascular access, such as a central line, to provide short-term or long-term access to your veins. If you have a catheter to drain urine from your bladder. If you are on a ventilator. A ventilator is a machine that helps you breathe by moving air in and out of your lungs. After  surgery. What are the risks? Risks of using CHG include: A skin reaction. Hearing loss, if CHG gets in your ears and you have a perforated eardrum. Eye injury, if CHG gets in your eyes and is not rinsed out. The CHG product catching fire. Make sure that you avoid smoking and flames after applying CHG to your skin. Do not use CHG: If you have a chlorhexidine allergy or have previously reacted to chlorhexidine. On babies younger than 44 months of age. How to use CHG solution Use CHG only as told by your health care provider, and follow the instructions on the label. Use the full amount of CHG as directed. Usually, this is one bottle. During a shower Follow these steps when using CHG solution during a shower (unless your health care provider gives you different instructions): Start the shower. Use your normal soap and shampoo to wash your face and hair. Turn off the shower or move out of the shower stream. Pour the CHG onto a clean washcloth. Do not use any type of brush or rough-edged sponge. Starting at your neck, lather your body down to your toes. Make sure you follow these instructions: If you will be having surgery, pay special attention to the part of your body where you will be having surgery. Scrub  this area for at least 1 minute. Do not use CHG on your head or face. If the solution gets into your ears or eyes, rinse them well with water. Avoid your genital area. Avoid any areas of skin that have broken skin, cuts, or scrapes. Scrub your back and under your arms. Make sure to wash skin folds. Let the lather sit on your skin for 1-2 minutes or as long as told by your health care provider. Thoroughly rinse your entire body in the shower. Make sure that all body creases and crevices are rinsed well. Dry off with a clean towel. Do not put any substances on your body afterward--such as powder, lotion, or perfume--unless you are told to do so by your health care provider. Only use lotions  that are recommended by the manufacturer. Put on clean clothes or pajamas. If it is the night before your surgery, sleep in clean sheets.  During a sponge bath Follow these steps when using CHG solution during a sponge bath (unless your health care provider gives you different instructions): Use your normal soap and shampoo to wash your face and hair. Pour the CHG onto a clean washcloth. Starting at your neck, lather your body down to your toes. Make sure you follow these instructions: If you will be having surgery, pay special attention to the part of your body where you will be having surgery. Scrub this area for at least 1 minute. Do not use CHG on your head or face. If the solution gets into your ears or eyes, rinse them well with water. Avoid your genital area. Avoid any areas of skin that have broken skin, cuts, or scrapes. Scrub your back and under your arms. Make sure to wash skin folds. Let the lather sit on your skin for 1-2 minutes or as long as told by your health care provider. Using a different clean, wet washcloth, thoroughly rinse your entire body. Make sure that all body creases and crevices are rinsed well. Dry off with a clean towel. Do not put any substances on your body afterward--such as powder, lotion, or perfume--unless you are told to do so by your health care provider. Only use lotions that are recommended by the manufacturer. Put on clean clothes or pajamas. If it is the night before your surgery, sleep in clean sheets. How to use CHG prepackaged cloths Only use CHG cloths as told by your health care provider, and follow the instructions on the label. Use the CHG cloth on clean, dry skin. Do not use the CHG cloth on your head or face unless your health care provider tells you to. When washing with the CHG cloth: Avoid your genital area. Avoid any areas of skin that have broken skin, cuts, or scrapes. Before surgery Follow these steps when using a CHG cloth to  clean before surgery (unless your health care provider gives you different instructions): Using the CHG cloth, vigorously scrub the part of your body where you will be having surgery. Scrub using a back-and-forth motion for 3 minutes. The area on your body should be completely wet with CHG when you are done scrubbing. Do not rinse. Discard the cloth and let the area air-dry. Do not put any substances on the area afterward, such as powder, lotion, or perfume. Put on clean clothes or pajamas. If it is the night before your surgery, sleep in clean sheets.  For general bathing Follow these steps when using CHG cloths for general bathing (unless your health care provider  gives you different instructions). Use a separate CHG cloth for each area of your body. Make sure you wash between any folds of skin and between your fingers and toes. Wash your body in the following order, switching to a new cloth after each step: The front of your neck, shoulders, and chest. Both of your arms, under your arms, and your hands. Your stomach and groin area, avoiding the genitals. Your right leg and foot. Your left leg and foot. The back of your neck, your back, and your buttocks. Do not rinse. Discard the cloth and let the area air-dry. Do not put any substances on your body afterward--such as powder, lotion, or perfume--unless you are told to do so by your health care provider. Only use lotions that are recommended by the manufacturer. Put on clean clothes or pajamas. Contact a health care provider if: Your skin gets irritated after scrubbing. You have questions about using your solution or cloth. You swallow any chlorhexidine. Call your local poison control center (1-623-376-6137 in the [place] Get help right away if: Your eyes itch badly, or they become very red or swollen. Your skin itches badly and is red or swollen. Your hearing changes. You have trouble seeing. You have swelling or tingling in your mouth or  throat. You have trouble breathing. These symptoms may represent a serious problem that is an emergency. Do not wait to see if the symptoms will go away. Get medical help right away. Call your local emergency services (911 in the [place] Do not drive yourself to the hospital. Summary Chlorhexidine gluconate (CHG) is a germ-killing (antiseptic) solution that is used to clean the skin. Cleaning your skin with CHG may help to lower your risk for infection. You may be given CHG to use for bathing. It may be in a bottle or in a prepackaged cloth to use on your skin. Carefully follow your health care provider's instructions and the instructions on the product label. Do not use CHG if you have a chlorhexidine allergy. Contact your health care provider if your skin gets irritated after scrubbing. This information is not intended to replace advice given to you by your health care provider. Make sure you discuss any questions you have with your health care provider. Document Revised: 05/06/2020 Document Reviewed: 05/06/2020 Elsevier Patient Education  2022 [place]. Laparoscopic Ventral Hernia Repair, Care After The following information offers guidance on how to care for yourself after your procedure. Your health care provider may also give you more specific instructions. If you have problems or questions, contact your health care provider. What can I expect after the procedure? After the procedure, it is common to have pain, discomfort, or soreness. Follow these instructions at home: Medicines Take over-the-counter and prescription medicines only as told by your health care provider. Ask your health care provider if the medicine prescribed to you: Requires you to avoid driving or using machinery. Can cause constipation. You may need to take these actions to prevent or treat constipation: Drink enough fluid to keep your urine pale yellow. Take over-the-counter or prescription medicines. Eat foods  that are high in fiber, such as beans, whole grains, and fresh fruits and vegetables. Limit foods that are high in fat and processed sugars, such as fried or sweet foods. Incision care  Follow instructions from your health care provider about how to take care of your incisions. Make sure you: Wash your hands with soap and water for at least 20 seconds before and after you change your  bandage (dressing) or before you touch your abdomen. If soap and water are not available, use hand sanitizer. Change your dressing as told by your health care provider. Leave stitches (sutures), skin glue, or adhesive strips in place. These skin closures may need to stay in place for 2 weeks or longer. If adhesive strip edges start to loosen and curl up, you may trim the loose edges. Do not remove adhesive strips completely unless your health care provider tells you to do that. Check your incision areas every day for signs of infection. Check for: More redness, swelling, or pain. Fluid or blood. Warmth. Pus or a bad smell. Bathing  Do not take baths, swim, or use a hot tub until your health care provider approves. Ask your health care provider if you may take showers. You may only be allowed to take sponge baths. Keep your dressing dry until your health care provider says it can be removed. Activity  Rest as told by your health care provider. Avoid sitting for a long time without moving. Get up to take short walks every 1-2 hours. This is important to improve blood flow and breathing. Ask for help if you feel weak or unsteady. Do not lift anything that is heavier than 10 lb (4.5 kg), or the limit that you are told, until your health care provider says that it is safe. If you were given a sedative during the procedure, it can affect you for several hours. Do not drive or operate machinery until your health care provider says that it is safe. Return to your normal activities as told by your health care provider. Ask  your health care provider what activities are safe for you. General instructions  Hold a pillow over your abdomen when you cough or sneeze. This helps with pain. Do not use any products that contain nicotine or tobacco. These products include cigarettes, chewing tobacco, and vaping devices, such as e-cigarettes. These can delay healing after surgery. If you need help quitting, ask your health care provider. You may be asked to continue to do deep breathing exercises at home. This will help to prevent a lung infection. Keep all follow-up visits. This is important. Contact a health care provider if: You have any of these signs of infection: More redness, swelling, or pain around an incision. Fluid or blood coming from an incision. Warmth coming from an incision. Pus or a bad smell coming from an incision. A fever or chills. You have pain that gets worse or does not get better with medicine. You have nausea or vomiting. You have a cough. You have shortness of breath. You have not had a bowel movement in 3 days. You are not able to urinate. Get help right away if you have: Severe pain in your abdomen. Persistent nausea and vomiting. Redness, warmth, or pain in your leg. Chest pain. Trouble breathing. These symptoms may represent a serious problem that is an emergency. Do not wait to see if the symptoms will go away. Get medical help right away. Call your local emergency services (911 in the [place] Do not drive yourself to the hospital. Summary After this procedure, it is common to have pain, discomfort, or soreness. Follow instructions from your health care provider about how to take care of your incision. Check your incision area every day for signs of infection. Report any signs of infection to your health care provider. Keep all follow-up visits. This is important. This information is not intended to replace advice given to  you by your health care provider. Make sure you discuss any  questions you have with your health care provider. Document Revised: 10/13/2019 Document Reviewed: 10/13/2019 Elsevier Patient Education  [place] Anesthesia, Adult, Care After This sheet gives you information about how to care for yourself after your procedure. Your health care provider may also give you more specific instructions. If you have problems or questions, contact your health care provider. What can I expect after the procedure? After the procedure, the following side effects are common: Pain or discomfort at the IV site. Nausea. Vomiting. Sore throat. Trouble concentrating. Feeling cold or chills. Feeling weak or tired. Sleepiness and fatigue. Soreness and body aches. These side effects can affect parts of the body that were not involved in surgery. Follow these instructions at home: For the time period you were told by your health care provider:  Rest. Do not participate in activities where you could fall or become injured. Do not drive or use machinery. Do not drink alcohol. Do not take sleeping pills or medicines that cause drowsiness. Do not make important decisions or sign legal documents. Do not take care of children on your own. Eating and drinking Follow any instructions from your health care provider about eating or drinking restrictions. When you feel hungry, start by eating small amounts of foods that are soft and easy to digest (bland), such as toast. Gradually return to your regular diet. Drink enough fluid to keep your urine pale yellow. If you vomit, rehydrate by drinking water, juice, or clear broth. General instructions If you have sleep apnea, surgery and certain medicines can increase your risk for breathing problems. Follow instructions from your health care provider about wearing your sleep device: Anytime you are sleeping, including during daytime naps. While taking prescription pain medicines, sleeping medicines, or medicines  that make you drowsy. Have a responsible adult stay with you for the time you are told. It is important to have someone help care for you until you are awake and alert. Return to your normal activities as told by your health care provider. Ask your health care provider what activities are safe for you. Take over-the-counter and prescription medicines only as told by your health care provider. If you smoke, do not smoke without supervision. Keep all follow-up visits as told by your health care provider. This is important. Contact a health care provider if: You have nausea or vomiting that does not get better with medicine. You cannot eat or drink without vomiting. You have pain that does not get better with medicine. You are unable to pass urine. You develop a skin rash. You have a fever. You have redness around your IV site that gets worse. Get help right away if: You have difficulty breathing. You have chest pain. You have blood in your urine or stool, or you vomit blood. Summary After the procedure, it is common to have a sore throat or nausea. It is also common to feel tired. Have a responsible adult stay with you for the time you are told. It is important to have someone help care for you until you are awake and alert. When you feel hungry, start by eating small amounts of foods that are soft and easy to digest (bland), such as toast. Gradually return to your regular diet. Drink enough fluid to keep your urine pale yellow. Return to your normal activities as told by your health care provider. Ask your health care provider what activities are safe for you.  This information is not intended to replace advice given to you by your health care provider. Make sure you discuss any questions you have with your health care provider. Document Revised: 11/09/2019 Document Reviewed: 06/08/2019 Elsevier Patient Education  2022 [employer].

## 2020-11-25 ENCOUNTER — Encounter (HOSPITAL_COMMUNITY)
Admission: RE | Admit: 2020-11-25 | Discharge: 2020-11-25 | Disposition: A | Discharge status: Home / Self Care | Payer: Medicare Other | Source: Ambulatory Visit | Attending: General Surgery | Admitting: General Surgery

## 2020-11-25 ENCOUNTER — Encounter (HOSPITAL_COMMUNITY): Payer: Self-pay

## 2020-11-25 ENCOUNTER — Other Ambulatory Visit: Payer: Self-pay

## 2020-11-27 ENCOUNTER — Encounter (HOSPITAL_COMMUNITY)
Admission: RE | Disposition: A | Discharge status: Home / Self Care | Payer: Self-pay | Source: Home / Self Care | Attending: General Surgery

## 2020-11-27 ENCOUNTER — Ambulatory Visit (HOSPITAL_COMMUNITY): Payer: Medicare Other | Admitting: Certified Registered"

## 2020-11-27 ENCOUNTER — Ambulatory Visit (HOSPITAL_COMMUNITY)
Admission: RE | Admit: 2020-11-27 | Discharge: 2020-11-27 | Disposition: A | Discharge status: Home / Self Care | Payer: Medicare Other | Attending: General Surgery | Admitting: General Surgery

## 2020-11-27 ENCOUNTER — Other Ambulatory Visit: Payer: Self-pay

## 2020-11-27 ENCOUNTER — Encounter (HOSPITAL_COMMUNITY): Payer: Self-pay | Admitting: General Surgery

## 2020-11-27 DIAGNOSIS — K439 Ventral hernia without obstruction or gangrene: Secondary | ICD-10-CM

## 2020-11-27 DIAGNOSIS — Z79899 Other long term (current) drug therapy: Secondary | ICD-10-CM | POA: Diagnosis not present

## 2020-11-27 HISTORY — PX: VENTRAL HERNIA REPAIR: SHX424

## 2020-11-27 SURGERY — REPAIR, HERNIA, VENTRAL, LAPAROSCOPIC
Anesthesia: General | Site: Abdomen

## 2020-11-27 MED ORDER — CHLORHEXIDINE GLUCONATE 0.12 % MT SOLN
OROMUCOSAL | Status: AC
  Administered 2020-11-27: 15 mL via OROMUCOSAL
  Filled 2020-11-27: qty 15

## 2020-11-27 MED ORDER — BUPIVACAINE LIPOSOME 1.3 % IJ SUSP
INTRAMUSCULAR | Status: AC
  Filled 2020-11-27: qty 20

## 2020-11-27 MED ORDER — ROCURONIUM BROMIDE 10 MG/ML (PF) SYRINGE
PREFILLED_SYRINGE | INTRAVENOUS | Status: AC
  Filled 2020-11-27: qty 10

## 2020-11-27 MED ORDER — LIDOCAINE HCL (PF) 2 % IJ SOLN
INTRAMUSCULAR | Status: AC
  Filled 2020-11-27: qty 5

## 2020-11-27 MED ORDER — FENTANYL CITRATE (PF) 100 MCG/2ML IJ SOLN
INTRAMUSCULAR | Status: DC | PRN
  Administered 2020-11-27: 100 ug via INTRAVENOUS
  Administered 2020-11-27 (×2): 50 ug via INTRAVENOUS

## 2020-11-27 MED ORDER — SUGAMMADEX SODIUM 500 MG/5ML IV SOLN
INTRAVENOUS | Status: DC | PRN
  Administered 2020-11-27: 200 mg via INTRAVENOUS

## 2020-11-27 MED ORDER — ONDANSETRON HCL 4 MG PO TABS: 4.0000 mg | ORAL_TABLET | Freq: Three times a day (TID) | ORAL | 1 refills | Status: DC | PRN

## 2020-11-27 MED ORDER — CHLORHEXIDINE GLUCONATE CLOTH 2 % EX PADS: 6.0000 | MEDICATED_PAD | Freq: Once | CUTANEOUS | Status: DC

## 2020-11-27 MED ORDER — ONDANSETRON HCL 4 MG/2ML IJ SOLN: 4.0000 mg | Freq: Once | INTRAMUSCULAR | Status: DC | PRN

## 2020-11-27 MED ORDER — FENTANYL CITRATE (PF) 100 MCG/2ML IJ SOLN
INTRAMUSCULAR | Status: AC
  Filled 2020-11-27: qty 2

## 2020-11-27 MED ORDER — CEFAZOLIN SODIUM-DEXTROSE 2-4 GM/100ML-% IV SOLN
2.0000 g | INTRAVENOUS | Status: AC
  Administered 2020-11-27: 2 g via INTRAVENOUS
  Filled 2020-11-27: qty 100

## 2020-11-27 MED ORDER — FENTANYL CITRATE PF 50 MCG/ML IJ SOSY: 25.0000 ug | PREFILLED_SYRINGE | INTRAMUSCULAR | Status: DC | PRN

## 2020-11-27 MED ORDER — ONDANSETRON HCL 4 MG/2ML IJ SOLN
INTRAMUSCULAR | Status: DC | PRN
  Administered 2020-11-27: 4 mg via INTRAVENOUS

## 2020-11-27 MED ORDER — PROPOFOL 10 MG/ML IV BOLUS
INTRAVENOUS | Status: DC | PRN
  Administered 2020-11-27: 120 mg via INTRAVENOUS

## 2020-11-27 MED ORDER — OXYCODONE HCL 5 MG PO TABS: 5.0000 mg | ORAL_TABLET | ORAL | 0 refills | Status: DC | PRN

## 2020-11-27 MED ORDER — 0.9 % SODIUM CHLORIDE (POUR BTL) OPTIME
TOPICAL | Status: DC | PRN
  Administered 2020-11-27: 1000 mL

## 2020-11-27 MED ORDER — ROCURONIUM BROMIDE 100 MG/10ML IV SOLN
INTRAVENOUS | Status: DC | PRN
  Administered 2020-11-27: 20 mg via INTRAVENOUS
  Administered 2020-11-27: 50 mg via INTRAVENOUS

## 2020-11-27 MED ORDER — DEXAMETHASONE SODIUM PHOSPHATE 10 MG/ML IJ SOLN
INTRAMUSCULAR | Status: AC
  Filled 2020-11-27: qty 1

## 2020-11-27 MED ORDER — LACTATED RINGERS IV SOLN: INTRAVENOUS | Status: DC

## 2020-11-27 MED ORDER — ORAL CARE MOUTH RINSE: 15.0000 mL | Freq: Once | OROMUCOSAL | Status: AC

## 2020-11-27 MED ORDER — LIDOCAINE HCL (CARDIAC) PF 50 MG/5ML IV SOSY
PREFILLED_SYRINGE | INTRAVENOUS | Status: DC | PRN
  Administered 2020-11-27: 100 mg via INTRAVENOUS

## 2020-11-27 MED ORDER — DEXAMETHASONE SODIUM PHOSPHATE 10 MG/ML IJ SOLN
INTRAMUSCULAR | Status: DC | PRN
Start: 2020-11-27 — End: 2020-11-27
  Administered 2020-11-27 (×2): 5 mg via INTRAVENOUS

## 2020-11-27 MED ORDER — MIDAZOLAM HCL 2 MG/2ML IJ SOLN
INTRAMUSCULAR | Status: AC
  Filled 2020-11-27: qty 2

## 2020-11-27 MED ORDER — PROPOFOL 10 MG/ML IV BOLUS
INTRAVENOUS | Status: AC
  Filled 2020-11-27: qty 20

## 2020-11-27 MED ORDER — MIDAZOLAM HCL 2 MG/2ML IJ SOLN
INTRAMUSCULAR | Status: DC | PRN
  Administered 2020-11-27: 2 mg via INTRAVENOUS

## 2020-11-27 MED ORDER — CHLORHEXIDINE GLUCONATE 0.12 % MT SOLN: 15.0000 mL | Freq: Once | OROMUCOSAL | Status: AC

## 2020-11-27 MED ORDER — BUPIVACAINE LIPOSOME 1.3 % IJ SUSP
INTRAMUSCULAR | Status: DC | PRN
  Administered 2020-11-27: 20 mL

## 2020-11-27 MED ORDER — ONDANSETRON HCL 4 MG/2ML IJ SOLN
INTRAMUSCULAR | Status: AC
  Filled 2020-11-27: qty 2

## 2020-11-27 SURGICAL SUPPLY — 52 items
ADH SKN CLS APL DERMABOND .7 (GAUZE/BANDAGES/DRESSINGS) ×1
APL PRP STRL LF DISP 70% ISPRP (MISCELLANEOUS) ×1
BLADE SURG 15 STRL LF DISP TIS (BLADE) ×1 IMPLANT
BLADE SURG 15 STRL SS (BLADE) ×2
CHLORAPREP W/TINT 26 (MISCELLANEOUS) ×2 IMPLANT
CLOTH BEACON ORANGE TIMEOUT ST (SAFETY) ×2 IMPLANT
COVER LIGHT HANDLE STERIS (MISCELLANEOUS) ×4 IMPLANT
DERMABOND ADVANCED (GAUZE/BANDAGES/DRESSINGS) ×1
DERMABOND ADVANCED .7 DNX12 (GAUZE/BANDAGES/DRESSINGS) ×1 IMPLANT
ELECT REM PT RETURN 9FT ADLT (ELECTROSURGICAL) ×2
ELECTRODE REM PT RTRN 9FT ADLT (ELECTROSURGICAL) ×1 IMPLANT
GAUZE 4X4 16PLY ~~LOC~~+RFID DBL (SPONGE) ×1 IMPLANT
GLOVE SURG ENC MOIS LTX SZ6.5 (GLOVE) ×2 IMPLANT
GLOVE SURG POLYISO LF SZ7.5 (GLOVE) ×4 IMPLANT
GLOVE SURG UNDER POLY LF SZ6.5 (GLOVE) ×2 IMPLANT
GLOVE SURG UNDER POLY LF SZ7 (GLOVE) ×4 IMPLANT
GOWN STRL REUS W/TWL LRG LVL3 (GOWN DISPOSABLE) ×6 IMPLANT
GRASPER SUT TROCAR 14GX15 (MISCELLANEOUS) ×2 IMPLANT
INST SET LAPROSCOPIC AP (KITS) ×2 IMPLANT
IV NS IRRIG 3000ML ARTHROMATIC (IV SOLUTION) IMPLANT
KIT TURNOVER KIT A (KITS) ×2 IMPLANT
LIGASURE LAP ATLAS 10MM 37CM (INSTRUMENTS) ×2 IMPLANT
MANIFOLD NEPTUNE II (INSTRUMENTS) ×2 IMPLANT
MESH VENTRALEX ST 2.5 CRC MED (Mesh General) ×1 IMPLANT
NDL HYPO 18GX1.5 BLUNT FILL (NEEDLE) IMPLANT
NDL HYPO 21X1.5 SAFETY (NEEDLE) ×1 IMPLANT
NDL INSUFFLATION 14GA 120MM (NEEDLE) ×1 IMPLANT
NEEDLE HYPO 18GX1.5 BLUNT FILL (NEEDLE) ×2 IMPLANT
NEEDLE HYPO 21X1.5 SAFETY (NEEDLE) ×2 IMPLANT
NEEDLE INSUFFLATION 14GA 120MM (NEEDLE) ×2 IMPLANT
NS IRRIG 1000ML POUR BTL (IV SOLUTION) ×2 IMPLANT
PACK LAP CHOLE LZT030E (CUSTOM PROCEDURE TRAY) ×2 IMPLANT
PAD ARMBOARD 7.5X6 YLW CONV (MISCELLANEOUS) ×2 IMPLANT
PENCIL SMOKE EVACUATOR COATED (MISCELLANEOUS) ×2 IMPLANT
SET BASIN LINEN APH (SET/KITS/TRAYS/PACK) ×2 IMPLANT
SET TUBE IRRIG SUCTION NO TIP (IRRIGATION / IRRIGATOR) IMPLANT
SET TUBE SMOKE EVAC HIGH FLOW (TUBING) ×2 IMPLANT
SHEARS HARMONIC ACE PLUS 36CM (ENDOMECHANICALS) ×1 IMPLANT
SUT ETHIBOND 0 MO6 C/R (SUTURE) ×1 IMPLANT
SUT MNCRL AB 4-0 PS2 18 (SUTURE) ×4 IMPLANT
SUT PROLENE 2 0 CR (SUTURE) ×2 IMPLANT
SUT VIC AB 3-0 SH 27 (SUTURE) ×2
SUT VIC AB 3-0 SH 27X BRD (SUTURE) IMPLANT
SUT VICRYL 0 UR6 27IN ABS (SUTURE) ×3 IMPLANT
SYR 20ML LL LF (SYRINGE) ×3 IMPLANT
TACKER 5MM HERNIA 3.5CML NAB (ENDOMECHANICALS) ×1 IMPLANT
TRAY FOLEY MTR SLVR 16FR STAT (SET/KITS/TRAYS/PACK) ×2 IMPLANT
TROCAR ENDO BLADELESS 11MM (ENDOMECHANICALS) ×2 IMPLANT
TROCAR XCEL NON-BLD 5MMX100MML (ENDOMECHANICALS) ×2 IMPLANT
TROCAR XCEL UNIV SLVE 11M 100M (ENDOMECHANICALS) ×4 IMPLANT
WARMER LAPAROSCOPE (MISCELLANEOUS) ×2 IMPLANT
YANKAUER SUCT BULB TIP 10FT TU (MISCELLANEOUS) ×2 IMPLANT

## 2020-11-27 NOTE — Anesthesia Preprocedure Evaluation (Signed)
Anesthesia Evaluation  Patient identified by MRN, date of birth, ID band Patient awake    Reviewed: Allergy & Precautions, H&P , NPO status , Patient's Chart, lab work & pertinent test results, reviewed documented beta blocker date and time   Airway Mallampati: II  TM Distance: >3 FB Neck ROM: full    Dental no notable dental hx.    Pulmonary neg pulmonary ROS,    Pulmonary exam normal breath sounds clear to auscultation       Cardiovascular Exercise Tolerance: Good negative cardio ROS   Rhythm:regular Rate:Normal     Neuro/Psych negative neurological ROS  negative psych ROS   GI/Hepatic negative GI ROS, Neg liver ROS,   Endo/Other  negative endocrine ROS  Renal/GU negative Renal ROS  negative genitourinary   Musculoskeletal   Abdominal   Peds  Hematology negative hematology ROS (+)   Anesthesia Other Findings   Reproductive/Obstetrics negative OB ROS                             Anesthesia Physical Anesthesia Plan  ASA: 2  Anesthesia Plan: General   Post-op Pain Management:    Induction:   PONV Risk Score and Plan: Propofol infusion  Airway Management Planned:   Additional Equipment:   Intra-op Plan:   Post-operative Plan:   Informed Consent: I have reviewed the patients History and Physical, chart, labs and discussed the procedure including the risks, benefits and alternatives for the proposed anesthesia with the patient or authorized representative who has indicated his/her understanding and acceptance.     Dental Advisory Given  Plan Discussed with: CRNA  Anesthesia Plan Comments:         Anesthesia Quick Evaluation  

## 2020-11-27 NOTE — Interval H&P Note (Signed)
History and Physical Interval Note:  11/27/2020 8:02 AM  Lori Ross  has presented today for surgery, with the diagnosis of Ventral Hernia.  The various methods of treatment have been discussed with the patient and family. After consideration of risks, benefits and other options for treatment, the patient has consented to  Procedure(s): LAPAROSCOPIC VENTRAL HERNIA W/MESH (N/A) as a surgical intervention.  The patient's history has been reviewed, patient examined, no change in status, stable for surgery.  I have reviewed the patient's chart and labs.  Questions were answered to the patient's satisfaction.     Virl Cagey

## 2020-11-27 NOTE — Anesthesia Procedure Notes (Signed)
Procedure Name: Intubation Date/Time: 11/27/2020 8:49 AM Performed by: Tacy Learn, CRNA Pre-anesthesia Checklist: Patient identified, Emergency Drugs available, Suction available, Patient being monitored and Timeout performed Patient Re-evaluated:Patient Re-evaluated prior to induction Oxygen Delivery Method: Circle system utilized Preoxygenation: Pre-oxygenation with 100% oxygen Induction Type: IV induction Laryngoscope Size: Miller and 2 Grade View: Grade I Tube type: Oral Tube size: 7.0 mm Number of attempts: 1 Airway Equipment and Method: Stylet Placement Confirmation: ETT inserted through vocal cords under direct vision, positive ETCO2, CO2 detector and breath sounds checked- equal and bilateral Secured at: 21 cm Tube secured with: Tape Dental Injury: Teeth and Oropharynx as per pre-operative assessment

## 2020-11-27 NOTE — Anesthesia Postprocedure Evaluation (Signed)
Anesthesia Post Note  Patient: Lori Ross  Procedure(s) Performed: LAPAROSCOPIC VENTRAL HERNIA W/MESH (Abdomen)  Patient location during evaluation: Phase II Anesthesia Type: General Level of consciousness: awake Pain management: pain level controlled Vital Signs Assessment: post-procedure vital signs reviewed and stable Respiratory status: spontaneous breathing and respiratory function stable Cardiovascular status: blood pressure returned to baseline and stable Postop Assessment: no headache and no apparent nausea or vomiting Anesthetic complications: no Comments: Late entry   No notable events documented.   Last Vitals:  Vitals:   11/27/20 1045 11/27/20 1114  BP: 134/85 129/83  Pulse: 78 83  Resp: 11   Temp:    SpO2: 97% 98%    Last Pain:  Vitals:   11/27/20 1114  TempSrc: Oral  PainSc: [place]

## 2020-11-27 NOTE — Discharge Instructions (Signed)
Discharge Laparoscopic Surgery Instructions:  Common Complaints: Right shoulder pain is common after laparoscopic surgery. This is secondary to the gas used in the surgery being trapped under the diaphragm.  Walk to help your body absorb the gas. This will improve in a few days. Pain at the port sites are common, especially the larger port sites. This will improve with time.  Some nausea is common and poor appetite. The main goal is to stay hydrated the first few days after surgery.  Pain from the hernia repair and the inflammatory response from healing in the mesh is common.   Diet/ Activity: Diet as tolerated. You may not have an appetite, but it is important to stay hydrated. Drink 64 ounces of water a day. Your appetite will return with time.  Shower per your regular routine daily.  Do not take hot showers. Take warm showers that are less than 10 minutes. Rest and listen to your body, but do not remain in bed all day.  Walk everyday for at least 15-20 minutes. Deep cough and move around every 1-2 hours in the first few days after surgery.  Do not lift > 10 lbs, perform excessive bending, pushing, pulling, squatting for 6-8 weeks after surgery.  Wear your binder to help with pain control as desired.  Do not pick at the dermabond glue on your incision sites.  This glue film will remain in place for 1-2 weeks and will start to peel off.  Do not place lotions or balms on your incision unless instructed to specifically by Dr. Constance Haw.   Pain Expectations and Narcotics: -After surgery you will have pain associated with your incisions and this is normal. The pain is muscular and nerve pain, and will get better with time. -You are encouraged and expected to take non narcotic medications like tylenol and ibuprofen (when able) to treat pain as multiple modalities can aid with pain treatment. -Narcotics are only used when pain is severe or there is breakthrough pain. -You are not expected to have a  pain score of 0 after surgery, as we cannot prevent pain. A pain score of 3-4 that allows you to be functional, move, walk, and tolerate some activity is the goal. The pain will continue to improve over the days after surgery and is dependent on your surgery. -Due to [place] law, we are only able to give a certain amount of pain medication to treat post operative pain, and we only give additional narcotics on a patient by patient basis.  -For most laparoscopic surgery, studies have shown that the majority of patients only need 10-15 narcotic pills, and for open surgeries most patients only need 15-20.   -Having appropriate expectations of pain and knowledge of pain management with non narcotics is important as we do not want anyone to become addicted to narcotic pain medication.  -Using ice packs in the first 48 hours and heating pads after 48 hours, wearing an abdominal binder (when recommended), and using over the counter medications are all ways to help with pain management.   -Simple acts like meditation and mindfulness practices after surgery can also help with pain control and research has proven the benefit of these practices.  Medication: Take tylenol and ibuprofen as needed for pain control, alternating every 4-6 hours.  Example:  Tylenol 1000mg  @ 6am, 12noon, 6pm, 25midnight (Do not exceed 4000mg  of tylenol a day). Ibuprofen 800mg  @ 9am, 3pm, 9pm, 3am (Do not exceed 3600mg  of ibuprofen a day).  Take Roxicodone for  breakthrough pain every 4 hours.  Take Colace for constipation related to narcotic pain medication. If you do not have a bowel movement in 2 days, take Miralax over the counter.  Drink plenty of water to also prevent constipation.   Contact Information: If you have questions or concerns, please call our office, [phone], Monday- Thursday 8AM-5PM and Friday 8AM-12Noon.  If it is after hours or on the weekend, please call [facility] Main [facility], [phone], [phone], and ask to  speak to the surgeon on call for Dr. Constance Haw at [facility].

## 2020-11-27 NOTE — Transfer of Care (Signed)
Immediate Anesthesia Transfer of Care Note  Patient: Lori Ross  Procedure(s) Performed: LAPAROSCOPIC VENTRAL HERNIA W/MESH (Abdomen)  Patient Location: PACU  Anesthesia Type:General  Level of Consciousness: awake, alert , oriented and patient cooperative  Airway & Oxygen Therapy: Patient Spontanous Breathing  Post-op Assessment: Report given to RN, Post -op Vital signs reviewed and stable and Patient moving all extremities X 4  Post vital signs: Reviewed and stable  Last Vitals:  Vitals Value Taken Time  BP 129/80 11/27/20 1020  Temp    Pulse 79 11/27/20 1021  Resp 16 11/27/20 1021  SpO2 97 % 11/27/20 1021  Vitals shown include unvalidated device data.  Last Pain:  Vitals:   11/27/20 0739  TempSrc: Oral  PainSc: 0-No pain      Patients Stated Pain Goal: 7 (16/10/96 0454)  Complications: No notable events documented.

## 2020-11-27 NOTE — Progress Notes (Addendum)
[facility]  Updated daughter surgery completed. Hybrid laparoscopic mesh repair. Binder ordered. Rx to [facility]. Will see in 4  weeks.   No heavy lifting > 10 lbs, excessive bending, pushing, pulling, or squatting for 6-8 weeks after surgery.   Curlene Labrum, MD [facility] [address] [place], [place] [address] [phone] (office)

## 2020-11-27 NOTE — Op Note (Signed)
[facility] Operative Note  11/27/20  Preoperative Diagnosis: Ventral hernia, possible Spigelian    Postoperative Diagnosis: Ventral hernia lateral to semilunar line and at the level of the anterior superior iliac crest    Procedure(s) Performed: Laparoscopic ventral hernia repair with mesh (Hybrid approach, Ventralex ST Patch 6.4cm)    Surgeon: Lanell Matar. Constance Haw, MD   Assistants: Aviva Signs, MD     Anesthesia: General endotracheal   Anesthesiologist: Louann Sjogren, MD    Specimens: None    Estimated Blood Loss: Minimal   Blood Replacement: None    Complications: None   Wound Class: Clean    Operative Indications: Lori Ross is a 70 yo has a right lower quadrant hernia that was noted on CT and felt to be a ventral hernia. The defect was above the inguinal canal region and lateral to the semi lunar line making it potentially a spigelian type hernia. We discussed repair with laparoscopic approach to ensure that we had good coverage and discussed risk of bleeding,infection, injury to bowel, recurrence, need for mesh, open repair.   Findings:Ventral hernia lateral to semilunar line and at the level of the anterior superior iliac crest, cecum within hernia sac reduced    Procedure: The patient was taken to the operating room and placed supine. General endotracheal anesthesia was induced. Intravenous antibiotics were administered per protocol.  An orogastric tube positioned to decompress the stomach. A foley was placed. The abdomen was prepared and draped in the usual sterile fashion.   A Veress was inserted in the left upper quadrant 2 fingerbreadth below the costal margin and low insufflation pressures and the saline drop test confirmed intraperitoneal location.  A 11 mm trocar was used under direct visualization to enter the peritoneum and no injury was noted. There was some minor preperitoneal pneumoperitoneum but nothing extensive. The patient had some  adhesions in the right lower quadrant.  An additional 11 mm trocar was placed in the right upper quadrant and left lower quadrant under direct visualization. My partner held the camera on the right side and I was able to take down the omental adhesion with a Harmonic energy device. The hernia  was noted in the right lower quadrant and the cecum was reduced. The cecum was dissected off the hernia sac. The hernia defect was about 2 cm in size and was lateral to the rectus and superior to the inguinal canal and just at the level of the superior iliac crest when we palpated externally. This was not consistent with a inguinal hernia but was a very low ventral hernia.  Given the proximity to the triangle of pain we did not want to do any tacking in this region. A small 3cm incision was made over the site of the defect and carried down to the external oblique aponeurosis which was intact, and this was incised. The peritoneum was noted and opened. Insufflation was stopped. The defect was felt and the hernia sac was carefully dissected off. A Ventralex ST Hernia Patch 6.4 cm was used to cover the defect and was secured to the deep tissue of the internal oblique with 0 Ethibond suture. Insufflation was resumed and the patch was noted to be in good position covering the defect and against the abdominal wall.  As the abdomen was desulfated the patch remained against the abdominal wall The hernia sac was closed over the mesh with 0 Ethibond as I could not really close the internal edge and the rectus edge this inferior. The  external oblique aponeurosis was closed with interrupted 0 Ethibond suture. The ports were removed and were smaller than our finger tip. The deep space was closed with 3-0 Vicryl an the skin of the incision and port sites were closed with 4-0 Subcuticular Monocryl and dermabond.  Dr. Arnoldo Morale was assisting throughout the procedure and was present for the critical portions of the case.   Final inspection  revealed acceptable hemostasis. All counts were correct at the end of the case. The patient was awakened from anesthesia and extubated without complication.  The patient went to the PACU in stable condition.   Lori Labrum, MD [facility] [address] [place], [place] [address] [phone] (office)

## 2020-11-28 ENCOUNTER — Encounter (HOSPITAL_COMMUNITY): Payer: Self-pay | Admitting: General Surgery

## 2020-12-26 ENCOUNTER — Encounter: Payer: Self-pay | Admitting: General Surgery

## 2020-12-26 ENCOUNTER — Ambulatory Visit (INDEPENDENT_AMBULATORY_CARE_PROVIDER_SITE_OTHER): Payer: Medicare Other | Admitting: General Surgery

## 2020-12-26 ENCOUNTER — Encounter: Payer: Medicare Other | Admitting: General Surgery

## 2020-12-26 ENCOUNTER — Other Ambulatory Visit: Payer: Self-pay

## 2020-12-26 VITALS — BP 134/90 | HR 78 | Temp 98.2°F | Resp 14 | Ht 64.0 in | Wt 140.0 lb

## 2020-12-26 DIAGNOSIS — K439 Ventral hernia without obstruction or gangrene: Secondary | ICD-10-CM

## 2020-12-26 NOTE — Patient Instructions (Signed)
No heavy lifting > 10 lbs, excessive bending, pushing, pulling, or squatting for 6-8 weeks after surgery.   

## 2020-12-27 NOTE — Progress Notes (Signed)
[facility]  Doing well after surgery. Incisions healing. Minor soreness.  BP 134/90   Pulse 78   Temp 98.2 F (36.8 C) (Other (Comment))   Resp 14   Ht 5\' 4"  (1.626 m)   Wt 140 lb (63.5 kg)   SpO2 96%   BMI 24.03 kg/m  Incision healing, no erythema or drainage Minor induration   Patient s/p hybrid laparoscopic ventral hernia repair.  No heavy lifting > 10 lbs, excessive bending, pushing, pulling, or squatting for 6-8 weeks after surgery.   Lori Labrum, MD [facility] [address] [place], [place] [address] [phone] (office)

## 2021-01-02 ENCOUNTER — Other Ambulatory Visit: Payer: Self-pay

## 2021-01-02 ENCOUNTER — Ambulatory Visit (INDEPENDENT_AMBULATORY_CARE_PROVIDER_SITE_OTHER): Payer: Medicare Other

## 2021-01-02 DIAGNOSIS — Z23 Encounter for immunization: Secondary | ICD-10-CM | POA: Diagnosis not present

## 2021-04-22 IMAGING — MG DIGITAL SCREENING BILATERAL MAMMOGRAM WITH TOMO AND CAD
6 of 10 series · 6 of 30 positions shown · non-contrast
Comparison: Previous exam(s).

CLINICAL DATA: Screening.

EXAM:
DIGITAL SCREENING BILATERAL MAMMOGRAM WITH TOMO AND CAD

[L CC synth-2D]
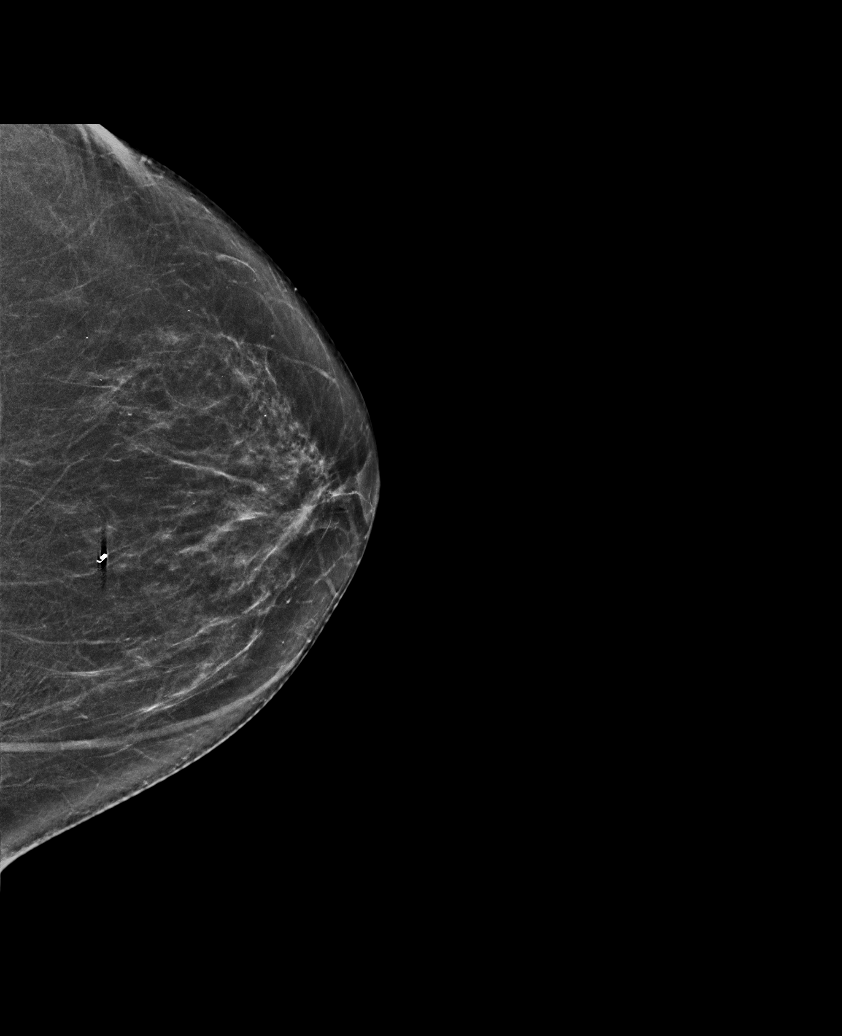

[R CC synth-2D]
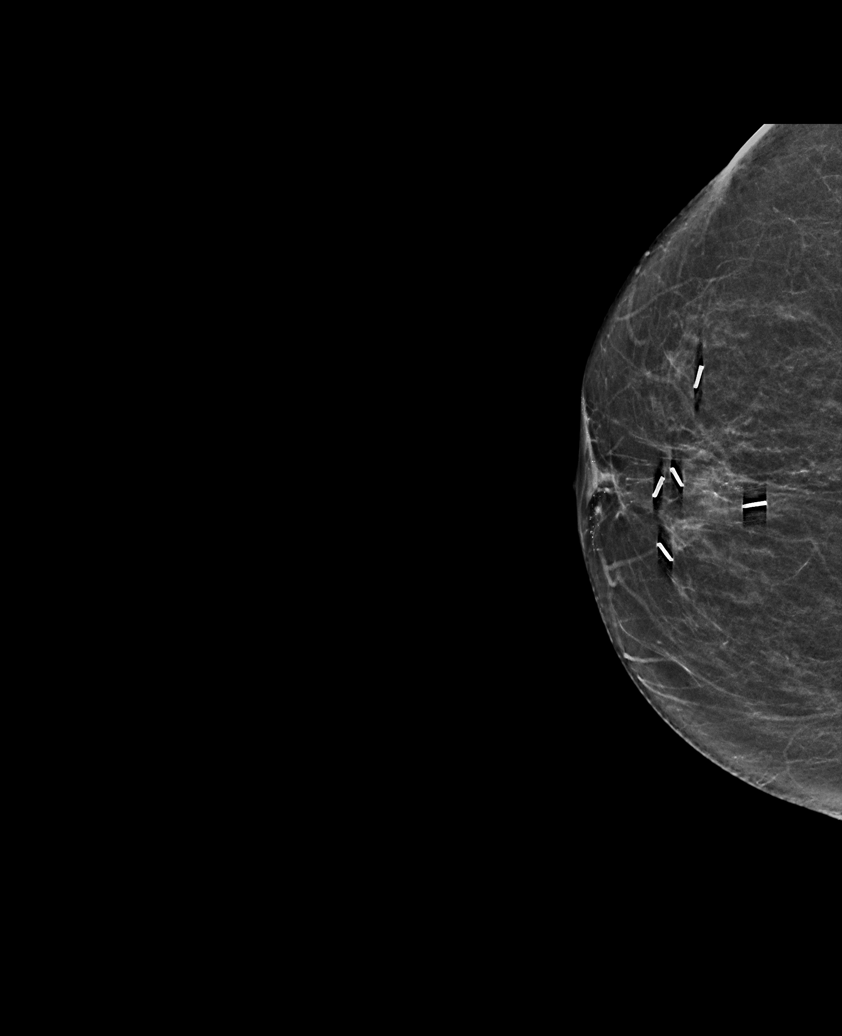

[L MLO synth-2D (1 of 2)]
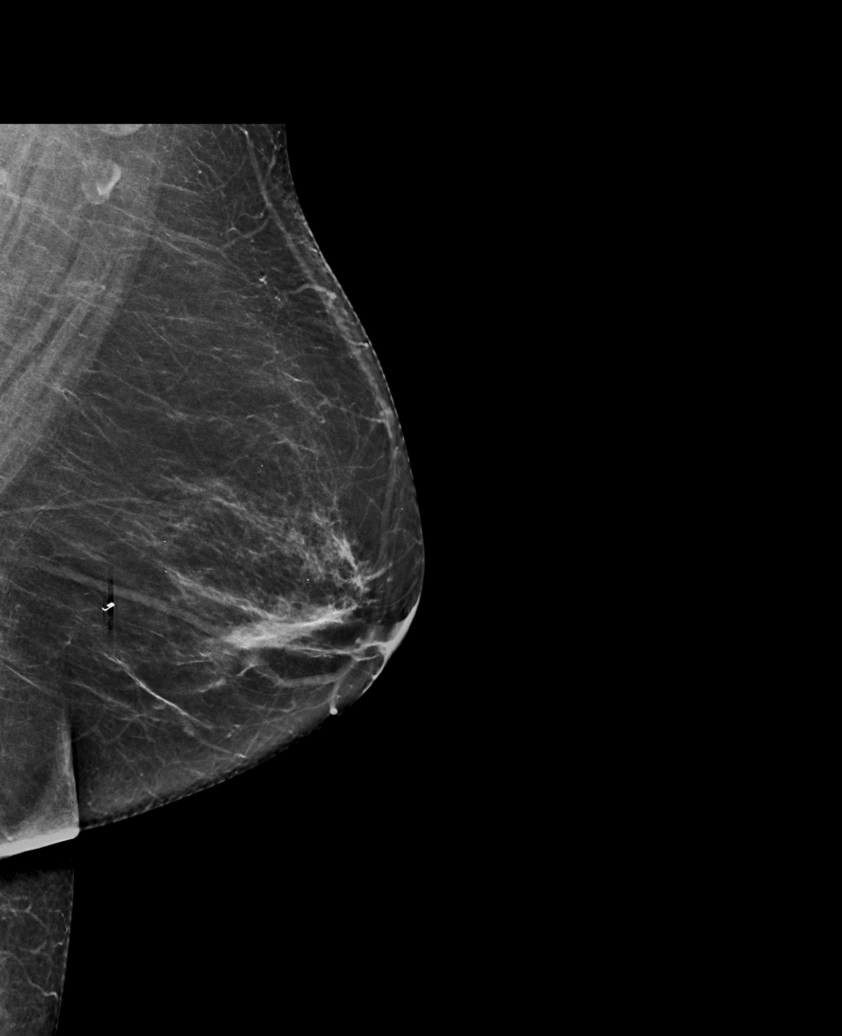

[R MLO synth-2D]
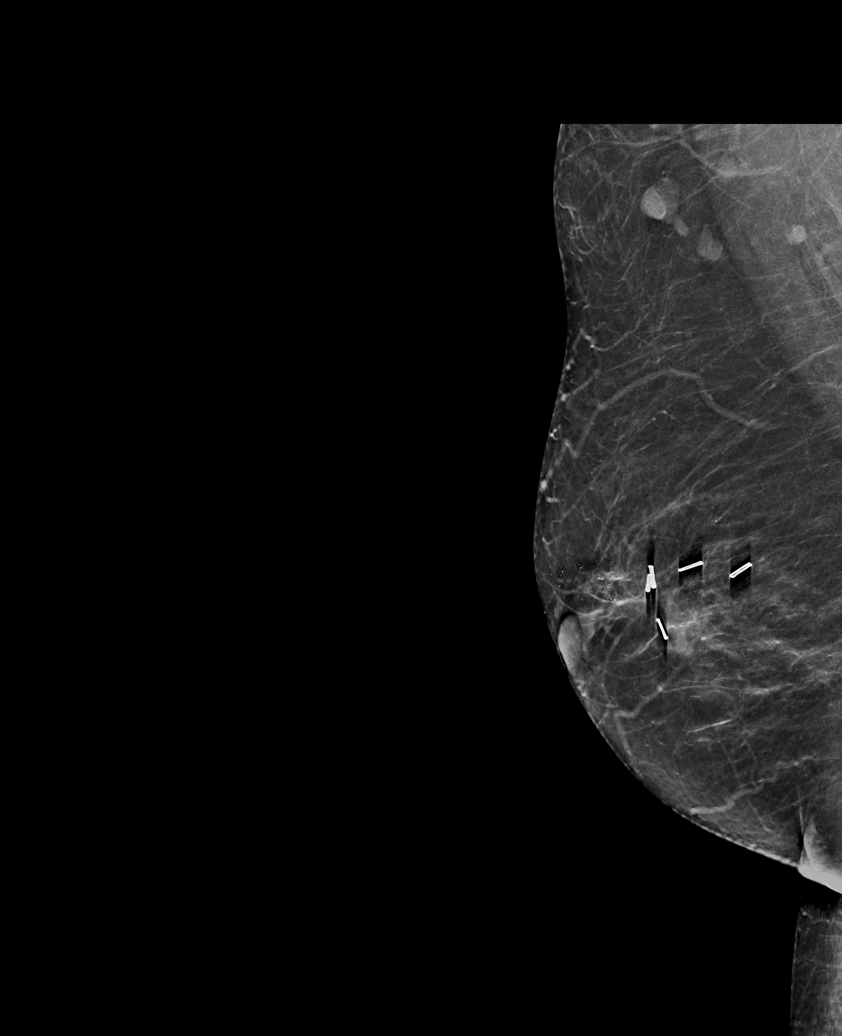

[L MLO synth-2D (2 of 2)]
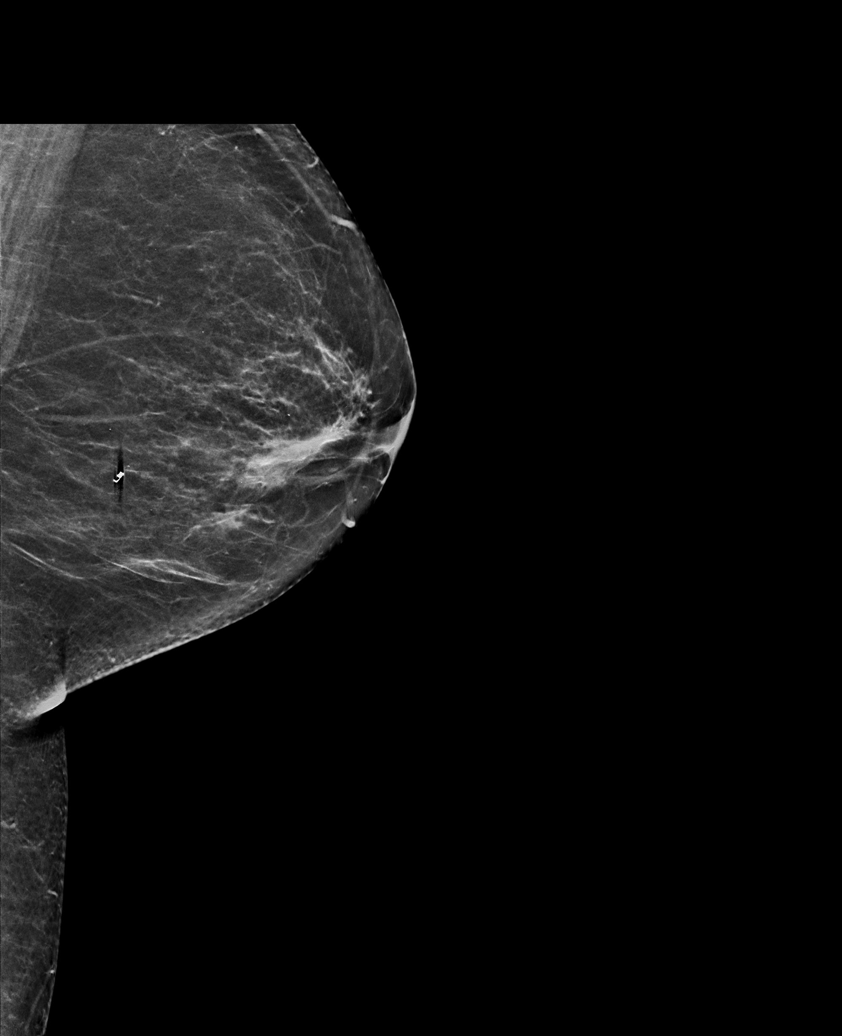

[R MLO tomo · tomo slice 32/63.0]
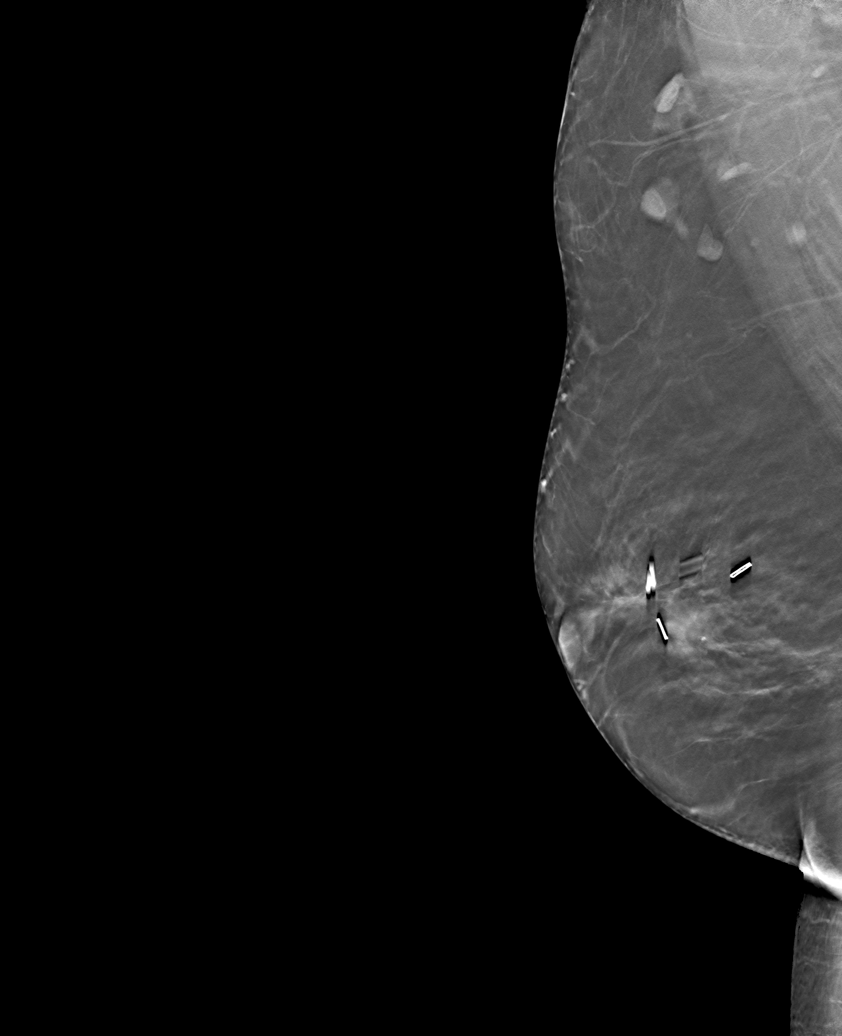

[6 of 30 positions shown; findings below may reference images not displayed]

ACR Breast Density Category b: There are scattered areas of
fibroglandular density.
FINDINGS: There are no findings suspicious for malignancy. Images were
processed with CAD.
IMPRESSION: No mammographic evidence of malignancy. A result letter of this
screening mammogram will be mailed directly to the patient.

RECOMMENDATION:
Screening mammogram in one year. (Code:[id number])

BI-RADS CATEGORY  1: Negative.

## 2021-04-24 ENCOUNTER — Ambulatory Visit (INDEPENDENT_AMBULATORY_CARE_PROVIDER_SITE_OTHER): Payer: Medicare Other | Admitting: Nurse Practitioner

## 2021-04-24 ENCOUNTER — Encounter: Payer: Self-pay | Admitting: Nurse Practitioner

## 2021-04-24 VITALS — BP 133/79 | HR 68 | Temp 98.7°F | Resp 20 | Ht 64.0 in | Wt 141.0 lb

## 2021-04-24 DIAGNOSIS — M85851 Other specified disorders of bone density and structure, right thigh: Secondary | ICD-10-CM | POA: Diagnosis not present

## 2021-04-24 DIAGNOSIS — K439 Ventral hernia without obstruction or gangrene: Secondary | ICD-10-CM | POA: Diagnosis not present

## 2021-04-24 DIAGNOSIS — E559 Vitamin D deficiency, unspecified: Secondary | ICD-10-CM

## 2021-04-24 DIAGNOSIS — M85852 Other specified disorders of bone density and structure, left thigh: Secondary | ICD-10-CM

## 2021-04-24 DIAGNOSIS — Z6826 Body mass index (BMI) 26.0-26.9, adult: Secondary | ICD-10-CM

## 2021-04-24 DIAGNOSIS — Z23 Encounter for immunization: Secondary | ICD-10-CM

## 2021-04-24 DIAGNOSIS — R609 Edema, unspecified: Secondary | ICD-10-CM | POA: Diagnosis not present

## 2021-04-24 DIAGNOSIS — E782 Mixed hyperlipidemia: Secondary | ICD-10-CM

## 2021-04-24 LAB — CBC WITH DIFFERENTIAL/PLATELET
Basophils Absolute: 0 10*3/uL (ref 0.0–0.2)
Basos: 1 %
EOS (ABSOLUTE): 0.1 10*3/uL (ref 0.0–0.4)
Eos: 2 %
Hematocrit: 42 % (ref 34.0–46.6)
Hemoglobin: 14.1 g/dL (ref 11.1–15.9)
Immature Grans (Abs): 0 10*3/uL (ref 0.0–0.1)
Immature Granulocytes: 0 %
Lymphocytes Absolute: 1.4 10*3/uL (ref 0.7–3.1)
Lymphs: 36 %
MCH: 29 pg (ref 26.6–33.0)
MCHC: 33.6 g/dL (ref 31.5–35.7)
MCV: 86 fL (ref 79–97)
Monocytes Absolute: 0.4 10*3/uL (ref 0.1–0.9)
Monocytes: 9 %
Neutrophils Absolute: 1.9 10*3/uL (ref 1.4–7.0)
Neutrophils: 52 %
Platelets: 322 10*3/uL (ref 150–450)
RBC: 4.86 x10E6/uL (ref 3.77–5.28)
RDW: 13.1 % (ref 11.7–15.4)
WBC: 3.8 10*3/uL (ref 3.4–10.8)

## 2021-04-24 LAB — LIPID PANEL
Chol/HDL Ratio: 2.7 ratio (ref 0.0–4.4)
Cholesterol, Total: 158 mg/dL (ref 100–199)
HDL: 58 mg/dL (ref >39)
LDL Chol Calc (NIH): 81 mg/dL (ref 0–99)
Triglycerides: 108 mg/dL (ref 0–149)
VLDL Cholesterol Cal: 19 mg/dL (ref 5–40)

## 2021-04-24 LAB — CMP14+EGFR
ALT: 18 IU/L (ref 0–32)
AST: 22 IU/L (ref 0–40)
Albumin/Globulin Ratio: 1.9 (ref 1.2–2.2)
Albumin: 4.7 g/dL (ref 3.8–4.8)
Alkaline Phosphatase: 39 IU/L — ABNORMAL LOW (ref 44–121)
BUN/Creatinine Ratio: 20 (ref 12–28)
BUN: 20 mg/dL (ref 8–27)
Bilirubin Total: 0.5 mg/dL (ref 0.0–1.2)
CO2: 26 mmol/L (ref 20–29)
Calcium: 10.3 mg/dL (ref 8.7–10.3)
Chloride: 104 mmol/L (ref 96–106)
Creatinine, Ser: 0.98 mg/dL (ref 0.57–1.00)
Globulin, Total: 2.5 g/dL (ref 1.5–4.5)
Glucose: 88 mg/dL (ref 70–99)
Potassium: 5.2 mmol/L (ref 3.5–5.2)
Sodium: 143 mmol/L (ref 134–144)
Total Protein: 7.2 g/dL (ref 6.0–8.5)
eGFR: 62 mL/min/{1.73_m2} (ref >59)

## 2021-04-24 MED ORDER — FENOFIBRATE 160 MG PO TABS: 160.0000 mg | ORAL_TABLET | Freq: Every day | ORAL | 1 refills | Status: DC

## 2021-04-24 MED ORDER — ATORVASTATIN CALCIUM 40 MG PO TABS: 40.0000 mg | ORAL_TABLET | Freq: Every day | ORAL | 1 refills | Status: DC

## 2021-04-24 NOTE — Addendum Note (Signed)
Addended by: Rolena Infante on: 04/24/2021 03:39 PM   Modules accepted: Orders

## 2021-04-24 NOTE — Patient Instructions (Signed)
Bone Health Bones protect organs, store calcium, anchor muscles, and support the whole body. Keeping your bones strong is important, especially as you get older. You can take actions to help keep your bones strong and healthy. Why is keeping my bones healthy important? Keeping your bones healthy is important because your body constantly replaces bone cells. Cells get old, and new cells take their place. As we age, we lose bone cells because the body may not be able to make enough new cells to replace the old cells. The amount of bone cells and bone tissue you have is referred to as bone mass. The higher your bone mass, the stronger your bones. The aging process leads to an overall loss of bone mass in the body, which can increase the likelihood of: Broken bones. A condition in which the bones become weak and brittle (osteoporosis). A large decline in bone mass occurs in older adults. In women, it occurs about the time of menopause. What actions can I take to keep my bones healthy? Good health habits are important for maintaining healthy bones. This includes eating nutritious foods and exercising regularly. To have healthy bones, you need to get enough of the right minerals and vitamins. Most nutrition experts recommend getting these nutrients from the foods that you eat. In some cases, taking supplements may also be recommended. Doing certain types of exercise is also important for bone health. What are the nutritional recommendations for healthy bones? Eating a well-balanced diet with plenty of calcium and vitamin D will help to protect your bones. Nutritional recommendations vary from person to person. Ask your health care provider what is healthy for you. Here are some general guidelines. Get enough calcium Calcium is the most important (essential) mineral for bone health. Most people can get enough calcium from their diet, but supplements may be recommended for people who are at risk for  osteoporosis. Good sources of calcium include: Dairy products, such as low-fat or nonfat milk, cheese, and yogurt. Dark green leafy vegetables, such as bok choy and broccoli. Foods that have calcium added to them (are fortified). Foods that may be fortified with calcium include orange juice, cereal, bread, soy beverages, and tofu products. Nuts, such as almonds. Follow these recommended amounts for daily calcium intake: Infants, 0-6 months: 200 mg. Infants, 6-12 months: 260 mg. Children, age 20-3: 700 mg. Children, age 206-8: 1,000 mg. Children, age 84-13: 1,300 mg. Teens, age 70-18: 1,300 mg. Adults, age 51-50: 1,000 mg. Adults, age 72-70: Men: 1,000 mg. Women: 1,200 mg. Adults, age 70 or older: 1,200 mg. Pregnant and breastfeeding females: Teens: 1,300 mg. Adults: 1,000 mg. Get enough vitamin D Vitamin D is the most essential vitamin for bone health. It helps the body absorb calcium. Sunlight stimulates the skin to make vitamin D, so be sure to get enough sunlight. If you live in a cold climate or you do not get outside often, your health care provider may recommend that you take vitamin D supplements. Good sources of vitamin D in your diet include: Egg yolks. Saltwater fish. Milk and cereal fortified with vitamin D. Follow these recommended amounts for daily vitamin D intake: Infants, 0-12 months: 400 international units (IU). Children and teens, age 20-18: 82 international units. Adults, age 70 or younger: 74 international units. Adults, age 203 or older: 70-1,000 international units. Get other important nutrients Other nutrients that are important for bone health include: Phosphorus. This mineral is found in meat, poultry, dairy foods, nuts, and legumes. The recommended daily  intake for adult men and adult women is 700 mg. Magnesium. This mineral is found in seeds, nuts, dark green vegetables, and legumes. The recommended daily intake for adult men is 400-420 mg. For adult women,  it is 310-320 mg. Vitamin K. This vitamin is found in green leafy vegetables. The recommended daily intake is 120 mcg for adult men and 90 mcg for adult women. What type of physical activity is best for building and maintaining healthy bones? Weight-bearing and strength-building activities are important for building and maintaining healthy bones. Weight-bearing activities cause muscles and bones to work against gravity. Strength-building activities increase the strength of the muscles that support bones. Weight-bearing and muscle-building activities include: Walking and hiking. Jogging and running. Dancing. Gym exercises. Lifting weights. Tennis and racquetball. Climbing stairs. Aerobics. Adults should get at least 30 minutes of moderate physical activity on most days. Children should get at least 60 minutes of moderate physical activity on most days. Ask your health care provider what type of exercise is best for you. How can I find out if my bone mass is low? Bone mass can be measured with an X-ray test called a bone mineral density (BMD) test. This test is recommended for all women who are age 63 or older. It may also be recommended for: Men who are age 46 or older. People who are at risk for osteoporosis because of: Having a long-term disease that weakens bones, such as kidney disease or rheumatoid arthritis. Having menopause earlier than normal. Taking medicine that weakens bones, such as steroids, thyroid hormones, or hormone treatment for breast cancer or prostate cancer. Smoking. Drinking three or more alcoholic drinks a day. Being underweight. Sedentary lifestyle. If you find that you have a low bone mass, you may be able to prevent osteoporosis or further bone loss by changing your diet and lifestyle. Where can I find more information? Bone Health & Osteoporosis Foundation: [employer] of Health: www.bones.SouthExposed.es International Osteoporosis  Foundation: Administrator.iofbonehealth.org Summary The aging process leads to an overall loss of bone mass in the body, which can increase the likelihood of broken bones and osteoporosis. Eating a well-balanced diet with plenty of calcium and vitamin D will help to protect your bones. Weight-bearing and strength-building activities are also important for building and maintaining strong bones. Bone mass can be measured with an X-ray test called a bone mineral density (BMD) test. This information is not intended to replace advice given to you by your health care provider. Make sure you discuss any questions you have with your health care provider. Document Revised: 08/07/2020 Document Reviewed: 08/07/2020 Elsevier Patient Education  [place].

## 2021-04-24 NOTE — Progress Notes (Signed)
Subjective:    Patient ID: Lori Ross, female    DOB: 03-09-1951, 71 y.o.   MRN: [id number]  Chief Complaint: medical management of chronic issues     HPI:  Lori Ross is a 71 y.o. who identifies as a female who was assigned female at birth.   Social history: Lives with: lives with 2 dogs and 4 cats. Work history: retired from [employer]   Comes in today for follow up of the following chronic medical issues:  1. Mixed hyperlipidemia Does try to watch diet and exercises several times a week, Lab Results  Component Value Date   CHOL 156 10/21/2020   HDL 43 10/21/2020   LDLCALC 90 10/21/2020   TRIG 131 10/21/2020   CHOLHDL 3.6 10/21/2020     2. Peripheral edema Has several times a week, but will usually resolve at night.  3. Ventral hernia without obstruction or gangrene Had repair of ventral hernia on 11/27/20  4. Vitamin D deficiency Is on daily vitamin d and calcium supplement. Last vitamin D Lab Results  Component Value Date   VD25OH 35.7 03/28/2019     5. Osteopenia of both hips Last dexascan was done 04/23/20. T score was -2.2.  6. BMI 26.0-26.9,adult No recent weight changes Wt Readings from Last 3 Encounters:  04/24/21 141 lb (64 kg)  12/26/20 140 lb (63.5 kg)  11/25/20 139 lb (63 kg)   BMI Readings from Last 3 Encounters:  04/24/21 24.20 kg/m  12/26/20 24.03 kg/m  11/25/20 23.86 kg/m     New complaints: None today  No Known Allergies Outpatient Encounter Medications as of 04/24/2021  Medication Sig   atorvastatin (LIPITOR) 40 MG tablet Take 1 tablet (40 mg total) by mouth daily.   Calcium Carbonate-Vitamin D 600-400 MG-UNIT tablet Take 1 tablet by mouth daily.   Cholecalciferol (VITAMIN D3) 2000 UNITS TABS Take 2,000 Units by mouth daily.   fenofibrate 160 MG tablet Take 1 tablet (160 mg total) by mouth daily.   MAGNESIUM PO Take 1 tablet by mouth daily.   Omega-3 Fatty Acids (FISH OIL) 1200 MG CAPS Take 1,200 mg by mouth  daily.   ondansetron (ZOFRAN) 4 MG tablet Take 1 tablet (4 mg total) by mouth every 8 (eight) hours as needed. (Patient not taking: Reported on 12/26/2020)   oxyCODONE (ROXICODONE) 5 MG immediate release tablet Take 1 tablet (5 mg total) by mouth every 4 (four) hours as needed for severe pain or breakthrough pain. (Patient not taking: Reported on 12/26/2020)   No facility-administered encounter medications on file as of 04/24/2021.    Past Surgical History:  Procedure Laterality Date   BREAST EXCISIONAL BIOPSY Right    papiloma   BREAST LUMPECTOMY WITH RADIOACTIVE SEED LOCALIZATION Right 10/27/2016   Procedure: RIGHT BREAST LUMPECTOMY WITH RADIOACTIVE SEED LOCALIZATION;  Surgeon: Fanny Skates, MD;  Location: [place];  Service: General;  Laterality: Right;   COLONOSCOPY     VENTRAL HERNIA REPAIR N/A 11/27/2020   Procedure: LAPAROSCOPIC VENTRAL HERNIA W/MESH;  Surgeon: Virl Cagey, MD;  Location: [facility];  Service: General;  Laterality: N/A;    Family History  Problem Relation Age of Onset   Cancer - Other Mother    Stroke Father 41   Hypertension Sister    Stroke Sister 53   Healthy Daughter    Healthy Son       Controlled substance contract: n/a     Review of Systems  Constitutional:  Negative  for diaphoresis.  Eyes:  Negative for pain.  Respiratory:  Negative for shortness of breath.   Cardiovascular:  Negative for chest pain, palpitations and leg swelling.  Gastrointestinal:  Negative for abdominal pain.  Endocrine: Negative for polydipsia.  Skin:  Negative for rash.  Neurological:  Negative for dizziness, weakness and headaches.  Hematological:  Does not bruise/bleed easily.  All other systems reviewed and are negative.     Objective:   Physical Exam Vitals and nursing note reviewed.  Constitutional:      General: She is not in acute distress.    Appearance: Normal appearance. She is well-developed.  HENT:     Head: Normocephalic.      Right Ear: Tympanic membrane normal.     Left Ear: Tympanic membrane normal.     Nose: Nose normal.     Mouth/Throat:     Mouth: Mucous membranes are moist.  Eyes:     Pupils: Pupils are equal, round, and reactive to light.  Neck:     Vascular: No carotid bruit or JVD.  Cardiovascular:     Rate and Rhythm: Normal rate and regular rhythm.     Heart sounds: Normal heart sounds.  Pulmonary:     Effort: Pulmonary effort is normal. No respiratory distress.     Breath sounds: Normal breath sounds. No wheezing or rales.  Chest:     Chest wall: No tenderness.  Breasts:    Right: Normal.     Left: Normal.  Abdominal:     General: Bowel sounds are normal. There is no distension or abdominal bruit.     Palpations: Abdomen is soft. There is no hepatomegaly, splenomegaly, mass or pulsatile mass.     Tenderness: There is no abdominal tenderness.  Musculoskeletal:        General: Normal range of motion.     Cervical back: Normal range of motion and neck supple.  Lymphadenopathy:     Cervical: No cervical adenopathy.  Skin:    General: Skin is warm and dry.  Neurological:     Mental Status: She is alert and oriented to person, place, and time.     Deep Tendon Reflexes: Reflexes are normal and symmetric.  Psychiatric:        Behavior: Behavior normal.        Thought Content: Thought content normal.        Judgment: Judgment normal.    BP 133/79    Pulse 68    Temp 98.7 F (37.1 C) (Temporal)    Resp 20    Ht 5' 4"  (1.626 m)    Wt 141 lb (64 kg)    SpO2 100%    BMI 24.20 kg/m        Assessment & Plan:  Lori Ross comes in today with chief complaint of Medical Management of Chronic Issues (Wants a breast exam/Also having pain on her left side from hernia surgery last Sept/)   Diagnosis and orders addressed:  1. Mixed hyperlipidemia Low fat diet - atorvastatin (LIPITOR) 40 MG tablet; Take 1 tablet (40 mg total) by mouth daily.  Dispense: 90 tablet; Refill: 1 - fenofibrate 160  MG tablet; Take 1 tablet (160 mg total) by mouth daily.  Dispense: 90 tablet; Refill: 1 - CBC with Differential/Platelet - CMP14+EGFR - Lipid panel  2. Peripheral edema Elevate legs when sitting  3. Ventral hernia without obstruction or gangrene repaired  4. Vitamin D deficiency Continue day vitamin d   5. Osteopenia of both  hips Weight bearing exercises  6. BMI 26.0-26.9,adult Discussed diet and exercise for person with BMI >25 Will recheck weight in 3-6 months    Labs pending Health Maintenance reviewed Diet and exercise encouraged  Follow up plan: 6 months   [name], FNP

## 2021-04-28 DIAGNOSIS — D3132 Benign neoplasm of left choroid: Secondary | ICD-10-CM | POA: Diagnosis not present

## 2021-04-28 DIAGNOSIS — H524 Presbyopia: Secondary | ICD-10-CM | POA: Diagnosis not present

## 2021-04-28 DIAGNOSIS — H43813 Vitreous degeneration, bilateral: Secondary | ICD-10-CM | POA: Diagnosis not present

## 2021-04-28 DIAGNOSIS — H5203 Hypermetropia, bilateral: Secondary | ICD-10-CM | POA: Diagnosis not present

## 2021-04-28 DIAGNOSIS — H52223 Regular astigmatism, bilateral: Secondary | ICD-10-CM | POA: Diagnosis not present

## 2021-04-28 DIAGNOSIS — H18513 Endothelial corneal dystrophy, bilateral: Secondary | ICD-10-CM | POA: Diagnosis not present

## 2021-04-28 DIAGNOSIS — H25813 Combined forms of age-related cataract, bilateral: Secondary | ICD-10-CM | POA: Diagnosis not present

## 2021-04-30 ENCOUNTER — Ambulatory Visit (INDEPENDENT_AMBULATORY_CARE_PROVIDER_SITE_OTHER): Payer: Medicare Other

## 2021-04-30 VITALS — Wt 141.0 lb

## 2021-04-30 DIAGNOSIS — Z0000 Encounter for general adult medical examination without abnormal findings: Secondary | ICD-10-CM | POA: Diagnosis not present

## 2021-04-30 NOTE — Progress Notes (Signed)
Subjective:   Lori Ross is a 71 y.o. female who presents for Medicare Annual (Subsequent) preventive examination.  Virtual Visit via Telephone Note  I connected with  DEALIE KOELZER on 04/30/21 at  9:45 AM EST by telephone and verified that I am speaking with the correct person using two identifiers.  Location: Patient: Home Provider: [facility] Persons participating in the virtual visit: patient/Nurse Health Advisor   I discussed the limitations, risks, security and privacy concerns of performing an evaluation and management service by telephone and the availability of in person appointments. The patient expressed understanding and agreed to proceed.  Interactive audio and video telecommunications were attempted between this nurse and patient, however failed, due to patient having technical difficulties OR patient did not have access to video capability.  We continued and completed visit with audio only.  Some vital signs may be absent or patient reported.   Forrest Jaroszewski E Kaven Cumbie, LPN   Review of Systems     Cardiac Risk Factors include: advanced age (>28men, >61 women);dyslipidemia;sedentary lifestyle;Other (see comment), Risk factor comments: atherosclerosis     Objective:    Today's Vitals   04/30/21 0947 04/30/21 0948  Weight: 141 lb (64 kg)   PainSc:  3    Body mass index is 24.2 kg/m.  Advanced Directives 04/30/2021 11/27/2020 11/25/2020 04/29/2020 04/25/2019 10/05/2017 10/27/2016  Does Patient Have a Medical Advance Directive? Yes Yes Yes Yes Yes Yes Yes  Type of Paramedic of [place];Living will - [place];Living will Living will;Healthcare Power of [place];Living will Living will Living will  Does patient want to make changes to medical advance directive? - - No - Patient declined No - Patient declined - No - Patient declined No - Patient declined  Copy of [place] in Chart? Yes -  validated most recent copy scanned in chart (See row information) - No - copy requested Yes - validated most recent copy scanned in chart (See row information) No - copy requested - -    Current Medications (verified) Outpatient Encounter Medications as of 04/30/2021  Medication Sig   atorvastatin (LIPITOR) 40 MG tablet Take 1 tablet (40 mg total) by mouth daily.   Calcium Carbonate-Vitamin D 600-400 MG-UNIT tablet Take 1 tablet by mouth daily.   Cholecalciferol (VITAMIN D3) 2000 UNITS TABS Take 2,000 Units by mouth daily.   fenofibrate 160 MG tablet Take 1 tablet (160 mg total) by mouth daily.   MAGNESIUM PO Take 1 tablet by mouth daily.   Omega-3 Fatty Acids (FISH OIL) 1200 MG CAPS Take 1,200 mg by mouth daily.   No facility-administered encounter medications on file as of 04/30/2021.    Allergies (verified) Patient has no known allergies.   History: Past Medical History:  Diagnosis Date   Breast mass, right    Breast mass, right 10/27/2016   Hyperlipidemia    Hyperlipidemia    Osteoporosis    Vitamin D insufficiency    Past Surgical History:  Procedure Laterality Date   BREAST EXCISIONAL BIOPSY Right    papiloma   BREAST LUMPECTOMY WITH RADIOACTIVE SEED LOCALIZATION Right 10/27/2016   Procedure: RIGHT BREAST LUMPECTOMY WITH RADIOACTIVE SEED LOCALIZATION;  Surgeon: Fanny Skates, MD;  Location: [facility];  Service: General;  Laterality: Right;   BREAST SURGERY  August 2018   No cancer found   COLONOSCOPY     HERNIA REPAIR  September 2022   VENTRAL HERNIA REPAIR N/A 11/27/2020  Procedure: LAPAROSCOPIC VENTRAL HERNIA W/MESH;  Surgeon: Virl Cagey, MD;  Location: [facility];  Service: General;  Laterality: N/A;   Family History  Problem Relation Age of Onset   Cancer - Other Mother    Cancer Mother    Stroke Father    Hypertension Sister    Stroke Sister    Healthy Daughter    Healthy Son    Social History   Socioeconomic History   Marital  status: Widowed    Spouse name: Not on file   Number of children: 2   Years of education: 14   Highest education level: Associate degree: academic program  Occupational History   Occupation: Tax Field seismologist    Comment: Retired  Tobacco Use   Smoking status: Never   Smokeless tobacco: Never  Scientific laboratory technician Use: Never used  Substance and Sexual Activity   Alcohol use: Never   Drug use: Never   Sexual activity: Not Currently    Birth control/protection: Pill, Post-menopausal  Other Topics Concern   Not on file  Social History Narrative   Lives alone in 2 story home- 2 dogs 4 cats    Daughter lives in [place] Determinants of Health   Financial Resource Strain: Low Risk    Difficulty of Paying Living Expenses: Not hard at all  Food Insecurity: No Food Insecurity   Worried About Charity fundraiser in the Last Year: Never true   Arboriculturist in the Last Year: Never true  Transportation Needs: No Transportation Needs   Lack of Transportation (Medical): No   Lack of Transportation (Non-Medical): No  Physical Activity: Inactive   Days of Exercise per Week: 0 days   Minutes of Exercise per Session: 0 min  Stress: No Stress Concern Present   Feeling of Stress : Only a little  Social Connections: Moderately Integrated   Frequency of Communication with Friends and Family: More than three times a week   Frequency of Social Gatherings with Friends and Family: More than three times a week   Attends Religious Services: More than 4 times per year   Active Member of [employer] or Organizations: Yes   Attends Archivist Meetings: More than 4 times per year   Marital Status: Widowed    Tobacco Counseling Counseling given: Not Answered   Clinical Intake:  Pre-visit preparation completed: Yes  Pain : 0-10 Pain Score: 3  Pain Type: Chronic pain Pain Location: Abdomen Pain Orientation: Right, Lower Pain Descriptors / Indicators:  Discomfort Pain Onset: More than a month ago Pain Frequency: Intermittent     BMI - recorded: 24.2 Nutritional Status: BMI of 19-24  Normal Nutritional Risks: None Diabetes: No  How often do you need to have someone help you when you read instructions, pamphlets, or other written materials from your doctor or pharmacy?: 1 - Never  Diabetic? no  Interpreter Needed?: No  Information entered by :: Thy Gullikson, LPN   Activities of Daily Living In your present state of health, do you have any difficulty performing the following activities: 04/30/2021 04/28/2021  Hearing? N N  Vision? N N  Difficulty concentrating or making decisions? N N  Walking or climbing stairs? N N  Dressing or bathing? N N  Doing errands, shopping? N N  Preparing Food and eating ? N N  Using the Toilet? N N  In the past six months, have you accidently leaked urine? Y Y  Comment mild -urge  incontinence - wears pantyliners -  Do you have problems with loss of bowel control? N N  Managing your Medications? N N  Managing your Finances? N N  Housekeeping or managing your Housekeeping? N N  Some recent data might be hidden    Patient Care Team: Chevis Pretty, FNP as PCP - General (Nurse Practitioner) Celestia Khat, OD (Optometry)  Indicate any recent Medical Services you may have received from other than [facility] providers in the past year (date may be approximate).     Assessment:   This is a routine wellness examination for Jayna.  Hearing/Vision screen Hearing Screening - Comments:: Denies hearing difficulties   Vision Screening - Comments:: Wears rx glasses - up to date with routine eye exams with [facility]  Dietary issues and exercise activities discussed: Current Exercise Habits: The patient does not participate in regular exercise at present, Exercise limited by: None identified   Goals Addressed             This Visit's Progress    Exercise 150 min/wk Moderate Activity   Not  on track      Depression Screen [facility] 2/9 Scores 04/30/2021 04/24/2021 10/21/2020 09/24/2020 08/22/2020 04/29/2020 04/22/2020  PHQ - 2 Score 0 0 0 0 0 0 0  PHQ- 9 Score 2 2 0 0 2 - -    Fall Risk Fall Risk  04/30/2021 04/28/2021 04/24/2021 11/14/2020 10/21/2020  Falls in the past year? 0 0 0 0 0  Comment - - - - -  Number falls in past yr: 0 - - - -  Injury with Fall? 0 - - - -  Risk for fall due to : No Fall Risks - - - -  Follow up Falls prevention discussed - - Falls evaluation completed -    FALL RISK PREVENTION PERTAINING TO THE HOME:  Any stairs in or around the home? Yes  If so, are there any without handrails? No  Home free of loose throw rugs in walkways, pet beds, electrical cords, etc? Yes  Adequate lighting in your home to reduce risk of falls? Yes   ASSISTIVE DEVICES UTILIZED TO PREVENT FALLS:  Life alert? No  Use of a cane, walker or w/c? No  Grab bars in the bathroom? Yes  Shower chair or bench in shower? Yes  Elevated toilet seat or a handicapped toilet? Yes   TIMED UP AND GO:  Was the test performed? No . Telephonic visit  Cognitive Function: Normal cognitive status assessed by direct observation by this Nurse Health Advisor. No abnormalities found.   MMSE - Mini Mental State Exam 10/05/2017  Orientation to time 5  Orientation to Place 5  Registration 3  Attention/ Calculation 5  Recall 3  Language- name 2 objects 2  Language- repeat 1  Language- follow 3 step command 3  Language- read & follow direction 1  Write a sentence 1  Copy design 1  Total score 30     6CIT Screen 04/29/2020 04/25/2019  What Year? 0 points 0 points  What month? 0 points 0 points  What time? 0 points 0 points  Count back from 20 0 points 0 points  Months in reverse 0 points 0 points  Repeat phrase 0 points 0 points  Total Score 0 0    Immunizations Immunization History  Administered Date(s) Administered   Fluad Quad(high Dose 65+) 11/11/2018, 12/07/2019, 01/02/2021    Influenza Split 12/29/2012   Influenza, High Dose Seasonal PF 12/10/2016, 12/06/2017   Influenza,inj,Quad  PF,6+ Mos 12/12/2013, 12/14/2014   Influenza-Unspecified 12/30/2015   Moderna Sars-Covid-2 Vaccination 04/13/2019, 05/12/2019, 01/26/2020   Pneumococcal Conjugate-13 03/30/2016   Pneumococcal Polysaccharide-23 08/06/2017   Td 08/16/2009   Zoster Recombinat (Shingrix) 04/24/2021   Zoster, Live 05/17/2013    TDAP status: Due, Education has been provided regarding the importance of this vaccine. Advised may receive this vaccine at local pharmacy or Health Dept. Aware to provide a copy of the vaccination record if obtained from local pharmacy or Health Dept. Verbalized acceptance and understanding.  Flu Vaccine status: Up to date  Pneumococcal vaccine status: Up to date  Covid-19 vaccine status: Completed vaccines  Qualifies for Shingles Vaccine? Yes   Zostavax completed Yes   Shingrix Completed?: No.    Education has been provided regarding the importance of this vaccine. Patient has been advised to call insurance company to determine out of pocket expense if they have not yet received this vaccine. Advised may also receive vaccine at local pharmacy or Health Dept. Verbalized acceptance and understanding.  Screening Tests Health Maintenance  Topic Date Due   TETANUS/TDAP  08/17/2019   COVID-19 Vaccine (4 - Booster for Moderna series) 05/10/2021 (Originally 03/22/2020)   Zoster Vaccines- Shingrix (2 of 2) 06/19/2021   MAMMOGRAM  10/14/2021   DEXA SCAN  04/23/2022   COLONOSCOPY (Pts 45-72yrs Insurance coverage will need to be confirmed)  02/15/2024   Pneumonia Vaccine 45+ Years old  Completed   INFLUENZA VACCINE  Completed   Hepatitis C Screening  Completed   HPV VACCINES  Aged Out    Health Maintenance  Health Maintenance Due  Topic Date Due   TETANUS/TDAP  08/17/2019    Colorectal cancer screening: Type of screening: Colonoscopy. Completed 02/15/2019. Repeat every 5  years  Mammogram status: Completed 10/14/2020. Repeat every year  Bone Density status: Completed 04/23/2020. Results reflect: Bone density results: OSTEOPENIA. Repeat every 2 years.  Lung Cancer Screening: (Low Dose CT Chest recommended if Age 17-80 years, 30 pack-year currently smoking OR have quit w/in 15years.) does not qualify.   Additional Screening:  Hepatitis C Screening: does qualify; Completed 03/26/2015  Vision Screening: Recommended annual ophthalmology exams for early detection of glaucoma and other disorders of the eye. Is the patient up to date with their annual eye exam?  Yes  Who is the provider or what is the name of the office in which the patient attends annual eye exams? [facility] If pt is not established with a provider, would they like to be referred to a provider to establish care? No .   Dental Screening: Recommended annual dental exams for proper oral hygiene  Community Resource Referral / Chronic Care Management: CRR required this visit?  No   CCM required this visit?  No      Plan:     I have personally reviewed and noted the following in the patients chart:   Medical and social history Use of alcohol, tobacco or illicit drugs  Current medications and supplements including opioid prescriptions.  Functional ability and status Nutritional status Physical activity Advanced directives List of other physicians Hospitalizations, surgeries, and ER visits in previous 12 months Vitals Screenings to include cognitive, depression, and falls Referrals and appointments  In addition, I have reviewed and discussed with patient certain preventive protocols, quality metrics, and best practice recommendations. A written personalized care plan for preventive services as well as general preventive health recommendations were provided to patient.     Sandrea Hammond, LPN   [id number]  Nurse Notes: None

## 2021-04-30 NOTE — Patient Instructions (Signed)
Lori Ross , Thank you for taking time to come for your Medicare Wellness Visit. I appreciate your ongoing commitment to your health goals. Please review the following plan we discussed and let me know if I can assist you in the future.   Screening recommendations/referrals: Colonoscopy: Done 02/15/2019 - Repeat in 5 years Mammogram: Done 10/14/2020 -Repeat annually Bone Density: Done 04/23/2020 - Repeat every 2 years Recommended yearly ophthalmology/optometry visit for glaucoma screening and checkup Recommended yearly dental visit for hygiene and checkup  Vaccinations: Influenza vaccine: Done 01/02/2021 - Repeat annually  Pneumococcal vaccine: Done 03/30/2016 & 08/06/2017 Tdap vaccine: Done 08/16/2009 - Repeat in 10 years *get at next visit Shingles vaccine: Done 04/24/2021 - get second dose at next visit   Covid-19: Done 04/13/2019, 05/12/2019, & 01/26/2020 - for additional boosters, contact pharmacy  Advanced directives: in chart  Conditions/risks identified: Aim for 30 minutes of exercise or brisk walking each day, drink 6-8 glasses of water and eat lots of fruits and vegetables.   Next appointment: Follow up in one year for your annual wellness visit    Preventive Care 65 Years and Older, Female Preventive care refers to lifestyle choices and visits with your health care provider that can promote health and wellness. What does preventive care include? A yearly physical exam. This is also called an annual well check. Dental exams once or twice a year. Routine eye exams. Ask your health care provider how often you should have your eyes checked. Personal lifestyle choices, including: Daily care of your teeth and gums. Regular physical activity. Eating a healthy diet. Avoiding tobacco and drug use. Limiting alcohol use. Practicing safe sex. Taking low-dose aspirin every day. Taking vitamin and mineral supplements as recommended by your health care provider. What happens during an annual  well check? The services and screenings done by your health care provider during your annual well check will depend on your age, overall health, lifestyle risk factors, and family history of disease. Counseling  Your health care provider may ask you questions about your: Alcohol use. Tobacco use. Drug use. Emotional well-being. Home and relationship well-being. Sexual activity. Eating habits. History of falls. Memory and ability to understand (cognition). Work and work Statistician. Reproductive health. Screening  You may have the following tests or measurements: Height, weight, and BMI. Blood pressure. Lipid and cholesterol levels. These may be checked every 5 years, or more frequently if you are over 80 years old. Skin check. Lung cancer screening. You may have this screening every year starting at age 75 if you have a 30-pack-year history of smoking and currently smoke or have quit within the past 15 years. Fecal occult blood test (FOBT) of the stool. You may have this test every year starting at age 15. Flexible sigmoidoscopy or colonoscopy. You may have a sigmoidoscopy every 5 years or a colonoscopy every 10 years starting at age 28. Hepatitis C blood test. Hepatitis B blood test. Sexually transmitted disease (STD) testing. Diabetes screening. This is done by checking your blood sugar (glucose) after you have not eaten for a while (fasting). You may have this done every 1-3 years. Bone density scan. This is done to screen for osteoporosis. You may have this done starting at age 108. Mammogram. This may be done every 1-2 years. Talk to your health care provider about how often you should have regular mammograms. Talk with your health care provider about your test results, treatment options, and if necessary, the need for more tests. Vaccines  Your health  care provider may recommend certain vaccines, such as: Influenza vaccine. This is recommended every year. Tetanus, diphtheria,  and acellular pertussis (Tdap, Td) vaccine. You may need a Td booster every 10 years. Zoster vaccine. You may need this after age 54. Pneumococcal 13-valent conjugate (PCV13) vaccine. One dose is recommended after age 3. Pneumococcal polysaccharide (PPSV23) vaccine. One dose is recommended after age 1. Talk to your health care provider about which screenings and vaccines you need and how often you need them. This information is not intended to replace advice given to you by your health care provider. Make sure you discuss any questions you have with your health care provider. Document Released: 03/22/2015 Document Revised: 11/13/2015 Document Reviewed: 12/25/2014 Elsevier Interactive Patient Education  2017 [employer] Prevention in the Home Falls can cause injuries. They can happen to people of all ages. There are many things you can do to make your home safe and to help prevent falls. What can I do on the outside of my home? Regularly fix the edges of walkways and driveways and fix any cracks. Remove anything that might make you trip as you walk through a door, such as a raised step or threshold. Trim any bushes or trees on the path to your home. Use bright outdoor lighting. Clear any walking paths of anything that might make someone trip, such as rocks or tools. Regularly check to see if handrails are loose or broken. Make sure that both sides of any steps have handrails. Any raised decks and porches should have guardrails on the edges. Have any leaves, snow, or ice cleared regularly. Use sand or salt on walking paths during winter. Clean up any spills in your garage right away. This includes oil or grease spills. What can I do in the bathroom? Use night lights. Install grab bars by the toilet and in the tub and shower. Do not use towel bars as grab bars. Use non-skid mats or decals in the tub or shower. If you need to sit down in the shower, use a plastic, non-slip  stool. Keep the floor dry. Clean up any water that spills on the floor as soon as it happens. Remove soap buildup in the tub or shower regularly. Attach bath mats securely with double-sided non-slip rug tape. Do not have throw rugs and other things on the floor that can make you trip. What can I do in the bedroom? Use night lights. Make sure that you have a light by your bed that is easy to reach. Do not use any sheets or blankets that are too big for your bed. They should not hang down onto the floor. Have a firm chair that has side arms. You can use this for support while you get dressed. Do not have throw rugs and other things on the floor that can make you trip. What can I do in the kitchen? Clean up any spills right away. Avoid walking on wet floors. Keep items that you use a lot in easy-to-reach places. If you need to reach something above you, use a strong step stool that has a grab bar. Keep electrical cords out of the way. Do not use floor polish or wax that makes floors slippery. If you must use wax, use non-skid floor wax. Do not have throw rugs and other things on the floor that can make you trip. What can I do with my stairs? Do not leave any items on the stairs. Make sure that there are handrails on  both sides of the stairs and use them. Fix handrails that are broken or loose. Make sure that handrails are as long as the stairways. Check any carpeting to make sure that it is firmly attached to the stairs. Fix any carpet that is loose or worn. Avoid having throw rugs at the top or bottom of the stairs. If you do have throw rugs, attach them to the floor with carpet tape. Make sure that you have a light switch at the top of the stairs and the bottom of the stairs. If you do not have them, ask someone to add them for you. What else can I do to help prevent falls? Wear shoes that: Do not have high heels. Have rubber bottoms. Are comfortable and fit you well. Are closed at the  toe. Do not wear sandals. If you use a stepladder: Make sure that it is fully opened. Do not climb a closed stepladder. Make sure that both sides of the stepladder are locked into place. Ask someone to hold it for you, if possible. Clearly mark and make sure that you can see: Any grab bars or handrails. First and last steps. Where the edge of each step is. Use tools that help you move around (mobility aids) if they are needed. These include: Canes. Walkers. Scooters. Crutches. Turn on the lights when you go into a dark area. Replace any light bulbs as soon as they burn out. Set up your furniture so you have a clear path. Avoid moving your furniture around. If any of your floors are uneven, fix them. If there are any pets around you, be aware of where they are. Review your medicines with your doctor. Some medicines can make you feel dizzy. This can increase your chance of falling. Ask your doctor what other things that you can do to help prevent falls. This information is not intended to replace advice given to you by your health care provider. Make sure you discuss any questions you have with your health care provider. Document Released: 12/20/2008 Document Revised: 08/01/2015 Document Reviewed: 03/30/2014 Elsevier Interactive Patient Education  2017 [employer].

## 2021-06-23 ENCOUNTER — Ambulatory Visit (INDEPENDENT_AMBULATORY_CARE_PROVIDER_SITE_OTHER): Payer: Medicare Other | Admitting: General Surgery

## 2021-06-23 ENCOUNTER — Other Ambulatory Visit: Payer: Self-pay

## 2021-06-23 ENCOUNTER — Encounter: Payer: Self-pay | Admitting: General Surgery

## 2021-06-23 VITALS — BP 142/87 | HR 84 | Temp 99.0°F | Resp 14 | Ht 64.0 in | Wt 143.0 lb

## 2021-06-23 DIAGNOSIS — R1031 Right lower quadrant pain: Secondary | ICD-10-CM

## 2021-06-23 DIAGNOSIS — G8929 Other chronic pain: Secondary | ICD-10-CM | POA: Diagnosis not present

## 2021-06-23 NOTE — Progress Notes (Signed)
[facility] History and Physical ? ? ?Chief Complaint   ?Hernia ?  ? ? ?Lori Ross is a 71 y.o. female.  ?HPI: [name] had her hernia repaired last fall and had a hybrid laparoscopic/ open mesh placement for a hernia later to her semilunar line on the right side. She has continued have some discomfort in the area and says it is not severe but it worries her. She has read about mesh and she is worried it has moved. She is nervous that the hernia is back or that the mesh is not right. She says the discomfort is not unbearable but she notices it. It has not changed since having the surgery. She had regular Bms without issues.  ? ?Past Medical History:  ?Diagnosis Date  ? Breast mass, right   ? Breast mass, right 10/27/2016  ? Hyperlipidemia   ? Hyperlipidemia   ? Osteoporosis   ? Vitamin D insufficiency   ? ? ?Past Surgical History:  ?Procedure Laterality Date  ? BREAST EXCISIONAL BIOPSY Right   ? papiloma  ? BREAST LUMPECTOMY WITH RADIOACTIVE SEED LOCALIZATION Right 10/27/2016  ? Procedure: RIGHT BREAST LUMPECTOMY WITH RADIOACTIVE SEED LOCALIZATION;  Surgeon: Fanny Skates, MD;  Location: [place];  Service: General;  Laterality: Right;  ? BREAST SURGERY  August 2018  ? No cancer found  ? COLONOSCOPY    ? HERNIA REPAIR  September 2022  ? VENTRAL HERNIA REPAIR N/A 11/27/2020  ? Procedure: LAPAROSCOPIC VENTRAL HERNIA W/MESH;  Surgeon: Virl Cagey, MD;  Location: [facility];  Service: General;  Laterality: N/A;  ? ? ?Family History  ?Problem Relation Age of Onset  ? Cancer - Other Mother   ? Cancer Mother   ? Stroke Father   ? Hypertension Sister   ? Stroke Sister   ? Healthy Daughter   ? Healthy Son   ? ? ?Social History  ? ?Tobacco Use  ? Smoking status: Never  ? Smokeless tobacco: Never  ?Vaping Use  ? Vaping Use: Never used  ?Substance Use Topics  ? Alcohol use: Never  ? Drug use: Never  ? ? ?Medications: I have reviewed the patient's current medications. ?Allergies  as of 06/23/2021   ?No Known Allergies ?  ? ?  ?Medication List  ?  ? ?  ? Accurate as of June 23, 2021  2:43 PM. If you have any questions, ask your nurse or doctor.  ?  ?  ? ?  ? ?atorvastatin 40 MG tablet ?Commonly known as: LIPITOR ?Take 1 tablet (40 mg total) by mouth daily. ?  ?Calcium Carbonate-Vitamin D 600-400 MG-UNIT tablet ?Take 1 tablet by mouth daily. ?  ?fenofibrate 160 MG tablet ?Take 1 tablet (160 mg total) by mouth daily. ?  ?Fish Oil 1200 MG Caps ?Take 1,200 mg by mouth daily. ?  ?MAGNESIUM PO ?Take 1 tablet by mouth daily. ?  ?Vitamin D3 50 MCG (2000 UT) Tabs ?Take 2,000 Units by mouth daily. ?  ? ?  ? ? ? ?ROS:  ?A comprehensive review of systems was negative except for: Gastrointestinal: positive for right sided abdominal pain above the hernia repair  ? ?Blood pressure (!) 142/87, pulse 84, temperature 99 ?F (37.2 ?C), temperature source Oral, resp. rate 14, height '5\' 4"'$  (1.626 m), weight 143 lb (64.9 kg), SpO2 100 %. ?Physical Exam ?Vitals reviewed.  ?Constitutional:   ?   Appearance: Normal appearance.  ?HENT:  ?   Nose: Nose normal.  ?Eyes:  ?  Extraocular Movements: Extraocular movements intact.  ?Cardiovascular:  ?   Rate and Rhythm: Normal rate and regular rhythm.  ?Pulmonary:  ?   Effort: Pulmonary effort is normal.  ?   Breath sounds: Normal breath sounds.  ?Abdominal:  ?   General: There is no distension.  ?   Palpations: Abdomen is soft.  ?   Tenderness: There is abdominal tenderness.  ?   Hernia: No hernia is present.  ?   Comments: Right side incision just above the iliac crest laterally   ?Musculoskeletal:     ?   General: Normal range of motion.  ?   Cervical back: Normal range of motion.  ?Skin: ?   General: Skin is warm.  ?Neurological:  ?   General: No focal deficit present.  ?   Mental Status: She is alert.  ?Psychiatric:     ?   Mood and Affect: Mood normal.     ?   Behavior: Behavior normal.  ? ? ?Results: ?None ? ? ?Assessment & Plan:  ?Lori Ross is a 72 y.o. female  with complaints of pain and discomfort at the hernia site. She does not notice a bulge and I do not feel a bulge and she is worried about the mesh.  ?- CT to further assess if recurrence of other issue ?- Will  call with the results  ? ?All questions were answered to the satisfaction of the patient. ? ? ? ?Virl Cagey ?06/23/2021, 2:43 PM  ? ? ? ? ? ?

## 2021-06-23 NOTE — Patient Instructions (Signed)
Will order CT to further assess the area and look at the mesh.  ?Will call with results. ? ?Can try topical lidocaine patches for now. Or tylenol and ibuprofen as needed.  ?

## 2021-06-30 ENCOUNTER — Ambulatory Visit (HOSPITAL_BASED_OUTPATIENT_CLINIC_OR_DEPARTMENT_OTHER)
Admission: RE | Admit: 2021-06-30 | Discharge: 2021-06-30 | Disposition: A | Discharge status: Home / Self Care | Payer: Medicare Other | Source: Ambulatory Visit | Attending: General Surgery | Admitting: General Surgery

## 2021-06-30 ENCOUNTER — Encounter (HOSPITAL_BASED_OUTPATIENT_CLINIC_OR_DEPARTMENT_OTHER): Payer: Self-pay

## 2021-06-30 DIAGNOSIS — I700 Atherosclerosis of aorta: Secondary | ICD-10-CM | POA: Diagnosis not present

## 2021-06-30 DIAGNOSIS — R1031 Right lower quadrant pain: Secondary | ICD-10-CM | POA: Diagnosis not present

## 2021-06-30 DIAGNOSIS — K4090 Unilateral inguinal hernia, without obstruction or gangrene, not specified as recurrent: Secondary | ICD-10-CM | POA: Diagnosis not present

## 2021-06-30 DIAGNOSIS — G8929 Other chronic pain: Secondary | ICD-10-CM | POA: Insufficient documentation

## 2021-06-30 DIAGNOSIS — N281 Cyst of kidney, acquired: Secondary | ICD-10-CM | POA: Diagnosis not present

## 2021-06-30 LAB — POCT I-STAT CREATININE: Creatinine, Ser: 0.9 mg/dL (ref 0.44–1.00)

## 2021-06-30 MED ORDER — IOHEXOL 300 MG/ML  SOLN
75.0000 mL | Freq: Once | INTRAMUSCULAR | Status: AC | PRN
  Administered 2021-06-30: 75 mL via INTRAVENOUS

## 2021-07-02 NOTE — Progress Notes (Signed)
CT without recurrence of the RLQ hernia, and stable small right inguinal hernia with fat and stable small umbilical hernia with fat.  She is still having some pain intermittently and she says she has a dull pain all the time. Nothing has been sharp. Lets try lidocaine, tylenol and ibuprofen, and we will touch base in July to discuss the pain and other options. She is going to call us in June to let us know.

## 2021-09-02 ENCOUNTER — Other Ambulatory Visit (HOSPITAL_COMMUNITY): Payer: Self-pay | Admitting: Nurse Practitioner

## 2021-09-02 DIAGNOSIS — Z1231 Encounter for screening mammogram for malignant neoplasm of breast: Secondary | ICD-10-CM

## 2021-10-15 ENCOUNTER — Other Ambulatory Visit: Payer: Self-pay | Admitting: *Deleted

## 2021-10-15 DIAGNOSIS — E782 Mixed hyperlipidemia: Secondary | ICD-10-CM

## 2021-10-15 MED ORDER — ATORVASTATIN CALCIUM 40 MG PO TABS: 40.0000 mg | ORAL_TABLET | Freq: Every day | ORAL | 0 refills | Status: DC

## 2021-10-16 ENCOUNTER — Ambulatory Visit (HOSPITAL_COMMUNITY)
Admission: RE | Admit: 2021-10-16 | Discharge: 2021-10-16 | Disposition: A | Discharge status: Home / Self Care | Payer: Medicare Other | Source: Ambulatory Visit | Attending: Nurse Practitioner | Admitting: Nurse Practitioner

## 2021-10-16 DIAGNOSIS — Z1231 Encounter for screening mammogram for malignant neoplasm of breast: Secondary | ICD-10-CM | POA: Insufficient documentation

## 2021-10-17 ENCOUNTER — Other Ambulatory Visit (HOSPITAL_COMMUNITY): Payer: Self-pay | Admitting: Nurse Practitioner

## 2021-10-17 DIAGNOSIS — R928 Other abnormal and inconclusive findings on diagnostic imaging of breast: Secondary | ICD-10-CM

## 2021-10-21 ENCOUNTER — Ambulatory Visit (HOSPITAL_COMMUNITY)
Admission: RE | Admit: 2021-10-21 | Discharge: 2021-10-21 | Disposition: A | Discharge status: Home / Self Care | Payer: Medicare Other | Source: Ambulatory Visit | Attending: Nurse Practitioner | Admitting: Nurse Practitioner

## 2021-10-21 DIAGNOSIS — R928 Other abnormal and inconclusive findings on diagnostic imaging of breast: Secondary | ICD-10-CM

## 2021-10-21 DIAGNOSIS — R922 Inconclusive mammogram: Secondary | ICD-10-CM | POA: Diagnosis not present

## 2021-10-21 DIAGNOSIS — N6002 Solitary cyst of left breast: Secondary | ICD-10-CM | POA: Diagnosis not present

## 2021-10-21 NOTE — Progress Notes (Signed)
Patient has appt

## 2021-10-24 ENCOUNTER — Encounter: Payer: Self-pay | Admitting: Nurse Practitioner

## 2021-10-24 ENCOUNTER — Ambulatory Visit (INDEPENDENT_AMBULATORY_CARE_PROVIDER_SITE_OTHER): Payer: Medicare Other | Admitting: Nurse Practitioner

## 2021-10-24 VITALS — BP 119/82 | HR 65 | Temp 97.2°F | Resp 20 | Ht 64.0 in | Wt 143.0 lb

## 2021-10-24 DIAGNOSIS — M85852 Other specified disorders of bone density and structure, left thigh: Secondary | ICD-10-CM | POA: Diagnosis not present

## 2021-10-24 DIAGNOSIS — E559 Vitamin D deficiency, unspecified: Secondary | ICD-10-CM | POA: Diagnosis not present

## 2021-10-24 DIAGNOSIS — Z6826 Body mass index (BMI) 26.0-26.9, adult: Secondary | ICD-10-CM

## 2021-10-24 DIAGNOSIS — Z23 Encounter for immunization: Secondary | ICD-10-CM

## 2021-10-24 DIAGNOSIS — R609 Edema, unspecified: Secondary | ICD-10-CM

## 2021-10-24 DIAGNOSIS — E782 Mixed hyperlipidemia: Secondary | ICD-10-CM | POA: Diagnosis not present

## 2021-10-24 DIAGNOSIS — M85851 Other specified disorders of bone density and structure, right thigh: Secondary | ICD-10-CM | POA: Diagnosis not present

## 2021-10-24 NOTE — Patient Instructions (Signed)

## 2021-10-24 NOTE — Progress Notes (Signed)
Subjective:    Patient ID: Lori Ross, female    DOB: [birth year], 71 y.o.   MRN: [id number]   Chief Complaint: medical management of chronic issues     HPI:  Lori Ross is a 71 y.o. who identifies as a female who was assigned female at birth.   Social history: Lives with: by herself Work history: retired   Scientist, forensic in today for follow up of the following chronic medical issues:  1. Mixed hyperlipidemia Does watch diet and walks several times a week. Lab Results  Component Value Date   CHOL 158 04/24/2021   HDL 58 04/24/2021   LDLCALC 81 04/24/2021   TRIG 108 04/24/2021   CHOLHDL 2.7 04/24/2021   The 10-year ASCVD risk score (Arnett DK, et al., 2019) is: 7.6%   2. Peripheral edema Has low ext edema on occasion. Is currently not on a fluid pill  3. Vitamin D deficiency Is on vitamin d supplement  4. Osteopenia of both hips Last dexascan was 04/22/20. T score was -2.2. does weight bearing exercise.  5. BMI 26.0-26.9,adult No recent weight changes Wt Readings from Last 3 Encounters:  10/24/21 143 lb (64.9 kg)  06/23/21 143 lb (64.9 kg)  04/30/21 141 lb (64 kg)   BMI Readings from Last 3 Encounters:  10/24/21 24.55 kg/m  06/23/21 24.55 kg/m  04/30/21 24.20 kg/m      New complaints: None today  No Known Allergies Outpatient Encounter Medications as of 10/24/2021  Medication Sig   atorvastatin (LIPITOR) 40 MG tablet Take 1 tablet (40 mg total) by mouth daily.   Calcium Carbonate-Vitamin D 600-400 MG-UNIT tablet Take 1 tablet by mouth daily.   Cholecalciferol (VITAMIN D3) 2000 UNITS TABS Take 2,000 Units by mouth daily.   fenofibrate 160 MG tablet Take 1 tablet (160 mg total) by mouth daily.   MAGNESIUM PO Take 1 tablet by mouth daily.   Omega-3 Fatty Acids (FISH OIL) 1200 MG CAPS Take 1,200 mg by mouth daily.   No facility-administered encounter medications on file as of 10/24/2021.    Past Surgical History:  Procedure Laterality Date    BREAST EXCISIONAL BIOPSY Right    papiloma   BREAST LUMPECTOMY WITH RADIOACTIVE SEED LOCALIZATION Right 10/27/2016   Procedure: RIGHT BREAST LUMPECTOMY WITH RADIOACTIVE SEED LOCALIZATION;  Surgeon: Fanny Skates, MD;  Location: [place];  Service: General;  Laterality: Right;   BREAST SURGERY  August 2018   No cancer found   COLONOSCOPY     HERNIA REPAIR  September 2022   VENTRAL HERNIA REPAIR N/A 11/27/2020   Procedure: LAPAROSCOPIC VENTRAL HERNIA W/MESH;  Surgeon: Virl Cagey, MD;  Location: [facility];  Service: General;  Laterality: N/A;    Family History  Problem Relation Age of Onset   Cancer - Other Mother    Cancer Mother    Stroke Father    Hypertension Sister    Stroke Sister    Healthy Daughter    Healthy Son       Controlled substance contract: n/a     Review of Systems  Constitutional:  Negative for diaphoresis.  Eyes:  Negative for pain.  Respiratory:  Negative for shortness of breath.   Cardiovascular:  Negative for chest pain, palpitations and leg swelling.  Gastrointestinal:  Negative for abdominal pain.  Endocrine: Negative for polydipsia.  Skin:  Negative for rash.  Neurological:  Negative for dizziness, weakness and headaches.  Hematological:  Does not bruise/bleed easily.  All other systems  reviewed and are negative.      Objective:   Physical Exam Vitals and nursing note reviewed.  Constitutional:      General: She is not in acute distress.    Appearance: Normal appearance. She is well-developed.  HENT:     Head: Normocephalic.     Right Ear: Tympanic membrane normal.     Left Ear: Tympanic membrane normal.     Nose: Nose normal.     Mouth/Throat:     Mouth: Mucous membranes are moist.  Eyes:     Pupils: Pupils are equal, round, and reactive to light.  Neck:     Vascular: No carotid bruit or JVD.  Cardiovascular:     Rate and Rhythm: Normal rate and regular rhythm.     Heart sounds: Normal heart sounds.   Pulmonary:     Effort: Pulmonary effort is normal. No respiratory distress.     Breath sounds: Normal breath sounds. No wheezing or rales.  Chest:     Chest wall: No tenderness.  Abdominal:     General: Bowel sounds are normal. There is no distension or abdominal bruit.     Palpations: Abdomen is soft. There is no hepatomegaly, splenomegaly, mass or pulsatile mass.     Tenderness: There is no abdominal tenderness.  Musculoskeletal:        General: Normal range of motion.     Cervical back: Normal range of motion and neck supple.  Lymphadenopathy:     Cervical: No cervical adenopathy.  Skin:    General: Skin is warm and dry.  Neurological:     Mental Status: She is alert and oriented to person, place, and time.     Deep Tendon Reflexes: Reflexes are normal and symmetric.  Psychiatric:        Behavior: Behavior normal.        Thought Content: Thought content normal.        Judgment: Judgment normal.    BP 119/82   Pulse 65   Temp (!) 97.2 F (36.2 C) (Temporal)   Resp 20   Ht 5' 4"  (1.626 m)   Wt 143 lb (64.9 kg)   SpO2 98%   BMI 24.55 kg/m         Assessment & Plan:   Lori Ross comes in today with chief complaint of Medical Management of Chronic Issues   Diagnosis and orders addressed:  1. Mixed hyperlipidemia Low fat diet - CBC with Differential/Platelet - CMP14+EGFR - Lipid panel  2. Peripheral edema Elevate legs when swelling  3. Vitamin D deficiency Continue daily vitamin d supplement  4. Osteopenia of both hips Weight bearing exercise  5. BMI 26.0-26.9,adult Discussed diet and exercise for person with BMI >25 Will recheck weight in 3-6 months    Labs pending Health Maintenance reviewed Diet and exercise encouraged  Follow up plan: 6 months   [name], FNP

## 2021-10-25 LAB — CBC WITH DIFFERENTIAL/PLATELET
Basophils Absolute: 0 10*3/uL (ref 0.0–0.2)
Basos: 1 %
EOS (ABSOLUTE): 0.1 10*3/uL (ref 0.0–0.4)
Eos: 4 %
Hematocrit: 41.1 % (ref 34.0–46.6)
Hemoglobin: 13.7 g/dL (ref 11.1–15.9)
Immature Grans (Abs): 0 10*3/uL (ref 0.0–0.1)
Immature Granulocytes: 0 %
Lymphocytes Absolute: 1.1 10*3/uL (ref 0.7–3.1)
Lymphs: 36 %
MCH: 29.7 pg (ref 26.6–33.0)
MCHC: 33.3 g/dL (ref 31.5–35.7)
MCV: 89 fL (ref 79–97)
Monocytes Absolute: 0.4 10*3/uL (ref 0.1–0.9)
Monocytes: 12 %
Neutrophils Absolute: 1.5 10*3/uL (ref 1.4–7.0)
Neutrophils: 47 %
Platelets: 321 10*3/uL (ref 150–450)
RBC: 4.61 x10E6/uL (ref 3.77–5.28)
RDW: 13.1 % (ref 11.7–15.4)
WBC: 3.1 10*3/uL — ABNORMAL LOW (ref 3.4–10.8)

## 2021-10-25 LAB — CMP14+EGFR
ALT: 16 IU/L (ref 0–32)
AST: 18 IU/L (ref 0–40)
Albumin/Globulin Ratio: 1.6 (ref 1.2–2.2)
Albumin: 4.5 g/dL (ref 3.9–4.9)
Alkaline Phosphatase: 38 IU/L — ABNORMAL LOW (ref 44–121)
BUN/Creatinine Ratio: 19 (ref 12–28)
BUN: 16 mg/dL (ref 8–27)
Bilirubin Total: 0.4 mg/dL (ref 0.0–1.2)
CO2: 19 mmol/L — ABNORMAL LOW (ref 20–29)
Calcium: 9.9 mg/dL (ref 8.7–10.3)
Chloride: 106 mmol/L (ref 96–106)
Creatinine, Ser: 0.83 mg/dL (ref 0.57–1.00)
Globulin, Total: 2.9 g/dL (ref 1.5–4.5)
Glucose: 97 mg/dL (ref 70–99)
Potassium: 5 mmol/L (ref 3.5–5.2)
Sodium: 145 mmol/L — ABNORMAL HIGH (ref 134–144)
Total Protein: 7.4 g/dL (ref 6.0–8.5)
eGFR: 76 mL/min/{1.73_m2} (ref >59)

## 2021-10-25 LAB — LIPID PANEL
Chol/HDL Ratio: 3.1 ratio (ref 0.0–4.4)
Cholesterol, Total: 151 mg/dL (ref 100–199)
HDL: 49 mg/dL (ref >39)
LDL Chol Calc (NIH): 86 mg/dL (ref 0–99)
Triglycerides: 85 mg/dL (ref 0–149)
VLDL Cholesterol Cal: 16 mg/dL (ref 5–40)

## 2021-10-27 NOTE — Addendum Note (Signed)
Addended by: Rolena Infante on: 10/27/2021 03:38 PM   Modules accepted: Orders

## 2021-12-16 ENCOUNTER — Ambulatory Visit (INDEPENDENT_AMBULATORY_CARE_PROVIDER_SITE_OTHER): Payer: Medicare Other

## 2021-12-16 DIAGNOSIS — Z23 Encounter for immunization: Secondary | ICD-10-CM

## 2022-03-10 ENCOUNTER — Other Ambulatory Visit (HOSPITAL_COMMUNITY): Payer: Self-pay | Admitting: Nurse Practitioner

## 2022-03-10 DIAGNOSIS — N6489 Other specified disorders of breast: Secondary | ICD-10-CM

## 2022-04-27 ENCOUNTER — Ambulatory Visit (INDEPENDENT_AMBULATORY_CARE_PROVIDER_SITE_OTHER): Payer: Medicare Other | Admitting: Nurse Practitioner

## 2022-04-27 ENCOUNTER — Encounter: Payer: Self-pay | Admitting: Nurse Practitioner

## 2022-04-27 VITALS — BP 117/74 | HR 69 | Temp 97.6°F | Resp 20 | Ht 64.0 in | Wt 141.0 lb

## 2022-04-27 DIAGNOSIS — Z23 Encounter for immunization: Secondary | ICD-10-CM

## 2022-04-27 DIAGNOSIS — R609 Edema, unspecified: Secondary | ICD-10-CM | POA: Diagnosis not present

## 2022-04-27 DIAGNOSIS — Z6826 Body mass index (BMI) 26.0-26.9, adult: Secondary | ICD-10-CM | POA: Diagnosis not present

## 2022-04-27 DIAGNOSIS — M85851 Other specified disorders of bone density and structure, right thigh: Secondary | ICD-10-CM | POA: Diagnosis not present

## 2022-04-27 DIAGNOSIS — E559 Vitamin D deficiency, unspecified: Secondary | ICD-10-CM

## 2022-04-27 DIAGNOSIS — E782 Mixed hyperlipidemia: Secondary | ICD-10-CM

## 2022-04-27 DIAGNOSIS — M85852 Other specified disorders of bone density and structure, left thigh: Secondary | ICD-10-CM

## 2022-04-27 LAB — CBC WITH DIFFERENTIAL/PLATELET
Basophils Absolute: 0 10*3/uL (ref 0.0–0.2)
Basos: 1 %
EOS (ABSOLUTE): 0.1 10*3/uL (ref 0.0–0.4)
Eos: 3 %
Hematocrit: 43.8 % (ref 34.0–46.6)
Hemoglobin: 14.3 g/dL (ref 11.1–15.9)
Immature Grans (Abs): 0 10*3/uL (ref 0.0–0.1)
Immature Granulocytes: 0 %
Lymphocytes Absolute: 1.5 10*3/uL (ref 0.7–3.1)
Lymphs: 37 %
MCH: 28.8 pg (ref 26.6–33.0)
MCHC: 32.6 g/dL (ref 31.5–35.7)
MCV: 88 fL (ref 79–97)
Monocytes Absolute: 0.3 10*3/uL (ref 0.1–0.9)
Monocytes: 8 %
Neutrophils Absolute: 2.1 10*3/uL (ref 1.4–7.0)
Neutrophils: 51 %
Platelets: 334 10*3/uL (ref 150–450)
RBC: 4.96 x10E6/uL (ref 3.77–5.28)
RDW: 12.9 % (ref 11.7–15.4)
WBC: 4.1 10*3/uL (ref 3.4–10.8)

## 2022-04-27 LAB — CMP14+EGFR
ALT: 19 IU/L (ref 0–32)
AST: 19 IU/L (ref 0–40)
Albumin/Globulin Ratio: 1.7 (ref 1.2–2.2)
Albumin: 4.6 g/dL (ref 3.8–4.8)
Alkaline Phosphatase: 41 IU/L — ABNORMAL LOW (ref 44–121)
BUN/Creatinine Ratio: 21 (ref 12–28)
BUN: 20 mg/dL (ref 8–27)
Bilirubin Total: 0.5 mg/dL (ref 0.0–1.2)
CO2: 21 mmol/L (ref 20–29)
Calcium: 10.1 mg/dL (ref 8.7–10.3)
Chloride: 104 mmol/L (ref 96–106)
Creatinine, Ser: 0.97 mg/dL (ref 0.57–1.00)
Globulin, Total: 2.7 g/dL (ref 1.5–4.5)
Glucose: 88 mg/dL (ref 70–99)
Potassium: 5.4 mmol/L — ABNORMAL HIGH (ref 3.5–5.2)
Sodium: 141 mmol/L (ref 134–144)
Total Protein: 7.3 g/dL (ref 6.0–8.5)
eGFR: 62 mL/min/{1.73_m2} (ref >59)

## 2022-04-27 LAB — LIPID PANEL
Chol/HDL Ratio: 3.1 ratio (ref 0.0–4.4)
Cholesterol, Total: 158 mg/dL (ref 100–199)
HDL: 51 mg/dL (ref >39)
LDL Chol Calc (NIH): 83 mg/dL (ref 0–99)
Triglycerides: 135 mg/dL (ref 0–149)
VLDL Cholesterol Cal: 24 mg/dL (ref 5–40)

## 2022-04-27 MED ORDER — FENOFIBRATE 160 MG PO TABS: 160.0000 mg | ORAL_TABLET | Freq: Every day | ORAL | 1 refills | Status: DC

## 2022-04-27 MED ORDER — ATORVASTATIN CALCIUM 40 MG PO TABS: 40.0000 mg | ORAL_TABLET | Freq: Every day | ORAL | 1 refills | Status: DC

## 2022-04-27 NOTE — Addendum Note (Signed)
Addended by: Chevis Pretty on: 04/27/2022 09:43 AM   Modules accepted: Level of Service

## 2022-04-27 NOTE — Progress Notes (Signed)
Subjective:    Patient ID: Lori Ross, female    DOB: 05-12-1950, 72 y.o.   MRN: [id number]   Chief Complaint: medical management of chronic issues     HPI:  Lori Ross is a 72 y.o. who identifies as a female who was assigned female at birth.   Social history: Lives with:  by herself- friends and family check on her often Work history: retired   Scientist, forensic in today for follow up of the following chronic medical issues:  1. Mixed hyperlipidemia Does try to watch diet and stays very active. Lab Results  Component Value Date   CHOL 151 10/24/2021   HDL 49 10/24/2021   LDLCALC 86 10/24/2021   TRIG 85 10/24/2021   CHOLHDL 3.1 10/24/2021   The 10-year ASCVD risk score (Arnett DK, et al., 2019) is: 8.5%   2. Peripheral edema Has occasionally  3. Osteopenia of both hips Last dexascan was done on 04/22/20. Her t score was -2.2. does walk several times a week for exercise.  4. Vitamin D deficiency Is on daily vitamin d supplement Last vitamin D Lab Results  Component Value Date   VD25OH 35.7 03/28/2019     5. BMI 26.0-26.9,adult No recent weight changes Wt Readings from Last 3 Encounters:  04/27/22 141 lb (64 kg)  10/24/21 143 lb (64.9 kg)  06/23/21 143 lb (64.9 kg)   BMI Readings from Last 3 Encounters:  04/27/22 24.20 kg/m  10/24/21 24.55 kg/m  06/23/21 24.55 kg/m      New complaints: None today  No Known Allergies Outpatient Encounter Medications as of 04/27/2022  Medication Sig   atorvastatin (LIPITOR) 40 MG tablet Take 1 tablet (40 mg total) by mouth daily.   Calcium Carbonate-Vitamin D 600-400 MG-UNIT tablet Take 1 tablet by mouth daily.   Cholecalciferol (VITAMIN D3) 2000 UNITS TABS Take 2,000 Units by mouth daily.   fenofibrate 160 MG tablet Take 1 tablet (160 mg total) by mouth daily.   MAGNESIUM PO Take 1 tablet by mouth daily.   Omega-3 Fatty Acids (FISH OIL) 1200 MG CAPS Take 1,200 mg by mouth daily.   No facility-administered  encounter medications on file as of 04/27/2022.    Past Surgical History:  Procedure Laterality Date   BREAST EXCISIONAL BIOPSY Right    papiloma   BREAST LUMPECTOMY WITH RADIOACTIVE SEED LOCALIZATION Right 10/27/2016   Procedure: RIGHT BREAST LUMPECTOMY WITH RADIOACTIVE SEED LOCALIZATION;  Surgeon: Fanny Skates, MD;  Location: [facility];  Service: General;  Laterality: Right;   BREAST SURGERY  August 2018   No cancer found   COLONOSCOPY     HERNIA REPAIR  September 2022   VENTRAL HERNIA REPAIR N/A 11/27/2020   Procedure: LAPAROSCOPIC VENTRAL HERNIA W/MESH;  Surgeon: Virl Cagey, MD;  Location: [facility];  Service: General;  Laterality: N/A;    Family History  Problem Relation Age of Onset   Cancer - Other Mother    Cancer Mother    Stroke Father    Hypertension Sister    Stroke Sister    Healthy Daughter    Healthy Son       Controlled substance contract: n/a     Review of Systems  Constitutional:  Negative for diaphoresis.  Eyes:  Negative for pain.  Respiratory:  Negative for shortness of breath.   Cardiovascular:  Negative for chest pain, palpitations and leg swelling.  Gastrointestinal:  Negative for abdominal pain.  Endocrine: Negative for polydipsia.  Skin:  Negative  for rash.  Neurological:  Negative for dizziness, weakness and headaches.  Hematological:  Does not bruise/bleed easily.  All other systems reviewed and are negative.      Objective:   Physical Exam Vitals and nursing note reviewed.  Constitutional:      General: She is not in acute distress.    Appearance: Normal appearance. She is well-developed.  HENT:     Head: Normocephalic.     Right Ear: Tympanic membrane normal.     Left Ear: Tympanic membrane normal.     Nose: Nose normal.     Mouth/Throat:     Mouth: Mucous membranes are moist.  Eyes:     Pupils: Pupils are equal, round, and reactive to light.  Neck:     Vascular: No carotid bruit or JVD.   Cardiovascular:     Rate and Rhythm: Normal rate and regular rhythm.     Heart sounds: Normal heart sounds.  Pulmonary:     Effort: Pulmonary effort is normal. No respiratory distress.     Breath sounds: Normal breath sounds. No wheezing or rales.  Chest:     Chest wall: No tenderness.  Abdominal:     General: Bowel sounds are normal. There is no distension or abdominal bruit.     Palpations: Abdomen is soft. There is no hepatomegaly, splenomegaly, mass or pulsatile mass.     Tenderness: There is no abdominal tenderness.  Musculoskeletal:        General: Normal range of motion.     Cervical back: Normal range of motion and neck supple.  Lymphadenopathy:     Cervical: No cervical adenopathy.  Skin:    General: Skin is warm and dry.  Neurological:     Mental Status: She is alert and oriented to person, place, and time.     Deep Tendon Reflexes: Reflexes are normal and symmetric.  Psychiatric:        Behavior: Behavior normal.        Thought Content: Thought content normal.        Judgment: Judgment normal.    BP 117/74   Pulse 69   Temp 97.6 F (36.4 C) (Temporal)   Resp 20   Ht 5' 4"$  (1.626 m)   Wt 141 lb (64 kg)   SpO2 97%   BMI 24.20 kg/m         Lori Ross comes in today with chief complaint of Medical Management of Chronic Issues   Diagnosis and orders addressed:  1. Mixed hyperlipidemia Low fat idet and exercise encouraged - fenofibrate 160 MG tablet; Take 1 tablet (160 mg total) by mouth daily.  Dispense: 90 tablet; Refill: 1 - atorvastatin (LIPITOR) 40 MG tablet; Take 1 tablet (40 mg total) by mouth daily.  Dispense: 90 tablet; Refill: 1 - CBC with Differential/Platelet - CMP14+EGFR - Lipid panel  2. Peripheral edema Elevate legs when swelling  3. Osteopenia of both hips Weight bearing exercise  4. Vitamin D deficiency Continue vitamin d supplement  5. BMI 26.0-26.9,adult Discussed diet and exercise for person with BMI >25 Will recheck  weight in 3-6 months    Labs pending Health Maintenance reviewed Diet and exercise encouraged  Follow up plan: 6 months   [name], FNP

## 2022-04-27 NOTE — Patient Instructions (Signed)
Exercising to Stay Healthy To become healthy and stay healthy, it is recommended that you do moderate-intensity and vigorous-intensity exercise. You can tell that you are exercising at a moderate intensity if your heart starts beating faster and you start breathing faster but can still hold a conversation. You can tell that you are exercising at a vigorous intensity if you are breathing much harder and faster and cannot hold a conversation while exercising. How can exercise benefit me? Exercising regularly is important. It has many health benefits, such as: Improving overall fitness, flexibility, and endurance. Increasing bone density. Helping with weight control. Decreasing body fat. Increasing muscle strength and endurance. Reducing stress and tension, anxiety, depression, or anger. Improving overall health. What guidelines should I follow while exercising? Before you start a new exercise program, talk with your health care provider. Do not exercise so much that you hurt yourself, feel dizzy, or get very short of breath. Wear comfortable clothes and wear shoes with good support. Drink plenty of water while you exercise to prevent dehydration or heat stroke. Work out until your breathing and your heartbeat get faster (moderate intensity). How often should I exercise? Choose an activity that you enjoy, and set realistic goals. Your health care provider can help you make an activity plan that is individually designed and works best for you. Exercise regularly as told by your health care provider. This may include: Doing strength training two times a week, such as: Lifting weights. Using resistance bands. Push-ups. Sit-ups. Yoga. Doing a certain intensity of exercise for a given amount of time. Choose from these options: A total of 150 minutes of moderate-intensity exercise every week. A total of 75 minutes of vigorous-intensity exercise every week. A mix of moderate-intensity and  vigorous-intensity exercise every week. Children, pregnant women, people who have not exercised regularly, people who are overweight, and older adults may need to talk with a health care provider about what activities are safe to perform. If you have a medical condition, be sure to talk with your health care provider before you start a new exercise program. What are some exercise ideas? Moderate-intensity exercise ideas include: Walking 1 mile (1.6 km) in about 15 minutes. Biking. Hiking. Golfing. Dancing. Water aerobics. Vigorous-intensity exercise ideas include: Walking 4.5 miles (7.2 km) or more in about 1 hour. Jogging or running 5 miles (8 km) in about 1 hour. Biking 10 miles (16.1 km) or more in about 1 hour. Lap swimming. Roller-skating or in-line skating. Cross-country skiing. Vigorous competitive sports, such as football, basketball, and soccer. Jumping rope. Aerobic dancing. What are some everyday activities that can help me get exercise? Yard work, such as: Pushing a lawn mower. Raking and bagging leaves. Washing your car. Pushing a stroller. Shoveling snow. Gardening. Washing windows or floors. How can I be more active in my day-to-day activities? Use stairs instead of an elevator. Take a walk during your lunch break. If you drive, park your car farther away from your work or school. If you take public transportation, get off one stop early and walk the rest of the way. Stand up or walk around during all of your indoor phone calls. Get up, stretch, and walk around every 30 minutes throughout the day. Enjoy exercise with a friend. Support to continue exercising will help you keep a regular routine of activity. Where to find more information You can find more information about exercising to stay healthy from: [place] Department of Health and Human Services: www.hhs.gov Centers for Disease Control and Prevention (  CDC): www.cdc.gov Summary Exercising regularly is  important. It will improve your overall fitness, flexibility, and endurance. Regular exercise will also improve your overall health. It can help you control your weight, reduce stress, and improve your bone density. Do not exercise so much that you hurt yourself, feel dizzy, or get very short of breath. Before you start a new exercise program, talk with your health care provider. This information is not intended to replace advice given to you by your health care provider. Make sure you discuss any questions you have with your health care provider. Document Revised: 06/21/2020 Document Reviewed: 06/21/2020 Elsevier Patient Education  2023 [employer]  

## 2022-04-28 ENCOUNTER — Ambulatory Visit (HOSPITAL_COMMUNITY)
Admission: RE | Admit: 2022-04-28 | Discharge: 2022-04-28 | Disposition: A | Discharge status: Home / Self Care | Payer: Medicare Other | Source: Ambulatory Visit | Attending: Nurse Practitioner | Admitting: Nurse Practitioner

## 2022-04-28 ENCOUNTER — Encounter (HOSPITAL_COMMUNITY): Payer: Self-pay

## 2022-04-28 DIAGNOSIS — N6489 Other specified disorders of breast: Secondary | ICD-10-CM

## 2022-04-28 DIAGNOSIS — R928 Other abnormal and inconclusive findings on diagnostic imaging of breast: Secondary | ICD-10-CM | POA: Diagnosis not present

## 2022-04-28 NOTE — Addendum Note (Signed)
Addended by: Rolena Infante on: 04/28/2022 11:10 AM   Modules accepted: Orders

## 2022-04-29 ENCOUNTER — Telehealth: Payer: Self-pay | Admitting: Nurse Practitioner

## 2022-04-29 DIAGNOSIS — E875 Hyperkalemia: Secondary | ICD-10-CM

## 2022-04-29 DIAGNOSIS — H524 Presbyopia: Secondary | ICD-10-CM | POA: Diagnosis not present

## 2022-04-29 DIAGNOSIS — H5203 Hypermetropia, bilateral: Secondary | ICD-10-CM | POA: Diagnosis not present

## 2022-04-29 DIAGNOSIS — D3132 Benign neoplasm of left choroid: Secondary | ICD-10-CM | POA: Diagnosis not present

## 2022-04-29 DIAGNOSIS — H52223 Regular astigmatism, bilateral: Secondary | ICD-10-CM | POA: Diagnosis not present

## 2022-04-29 DIAGNOSIS — H43813 Vitreous degeneration, bilateral: Secondary | ICD-10-CM | POA: Diagnosis not present

## 2022-04-29 DIAGNOSIS — H2513 Age-related nuclear cataract, bilateral: Secondary | ICD-10-CM | POA: Diagnosis not present

## 2022-04-29 DIAGNOSIS — H18513 Endothelial corneal dystrophy, bilateral: Secondary | ICD-10-CM | POA: Diagnosis not present

## 2022-04-29 NOTE — Telephone Encounter (Signed)
Per [facility] most recent lab result notes, wants pt to have potassium rechecked. Can someone add future lab order so patient can come in to have it rechecked?

## 2022-04-29 NOTE — Telephone Encounter (Signed)
Future lab placed - patient aware

## 2022-05-01 ENCOUNTER — Ambulatory Visit (INDEPENDENT_AMBULATORY_CARE_PROVIDER_SITE_OTHER): Payer: Medicare Other

## 2022-05-01 VITALS — Ht 64.0 in | Wt 141.0 lb

## 2022-05-01 DIAGNOSIS — Z0000 Encounter for general adult medical examination without abnormal findings: Secondary | ICD-10-CM | POA: Diagnosis not present

## 2022-05-01 NOTE — Progress Notes (Signed)
Subjective:   Lori Ross is a 72 y.o. female who presents for Medicare Annual (Subsequent) preventive examination. I connected with  KUM BARNWELL on 05/01/22 by a audio enabled telemedicine application and verified that I am speaking with the correct person using two identifiers.  Patient Location: Home  Provider Location: Home Office  I discussed the limitations of evaluation and management by telemedicine. The patient expressed understanding and agreed to proceed.  Review of Systems     Cardiac Risk Factors include: advanced age (>48mn, >>76women)     Objective:    Today's Vitals   05/01/22 0952  Weight: 141 lb (64 kg)  Height: '5\' 4"'$  (1.626 m)   Body mass index is 24.2 kg/m.     05/01/2022    9:54 AM 04/30/2021   10:02 AM 11/27/2020    7:28 AM 11/25/2020    3:04 PM 04/29/2020    8:55 AM 04/25/2019    9:17 AM 10/05/2017    9:09 AM  Advanced Directives  Does Patient Have a Medical Advance Directive? Yes Yes Yes Yes Yes Yes Yes  Type of AParamedicof ANorth En[place]Living will [place] will  HBelle [place]aineLiving will Living will;Healthcare Power of [place]Living will Living will  Does patient want to make changes to medical advance directive? No - Patient declined   No - Patient declined No - Patient declined  No - Patient declined  Copy of HCl[place]in Chart? Yes - validated most recent copy scanned in chart (See row information) Yes - validated most recent copy scanned in chart (See row information)  No - copy requested Yes - validated most recent copy scanned in chart (See row information) No - copy requested     Current Medications (verified) Outpatient Encounter Medications as of 05/01/2022  Medication Sig   atorvastatin (LIPITOR) 40 MG tablet Take 1 tablet (40 mg total) by mouth daily.   Calcium Carbonate-Vitamin D 600-400 MG-UNIT tablet Take 1 tablet by  mouth daily.   Cholecalciferol (VITAMIN D3) 2000 UNITS TABS Take 2,000 Units by mouth daily.   fenofibrate 160 MG tablet Take 1 tablet (160 mg total) by mouth daily.   MAGNESIUM PO Take 1 tablet by mouth daily.   Omega-3 Fatty Acids (FISH OIL) 1200 MG CAPS Take 1,200 mg by mouth daily.   No facility-administered encounter medications on file as of 05/01/2022.    Allergies (verified) Patient has no known allergies.   History: Past Medical History:  Diagnosis Date   Breast mass, right    Breast mass, right 10/27/2016   Hyperlipidemia    Hyperlipidemia    Osteoporosis    Vitamin D insufficiency    Past Surgical History:  Procedure Laterality Date   BREAST EXCISIONAL BIOPSY Right    papiloma   BREAST LUMPECTOMY WITH RADIOACTIVE SEED LOCALIZATION Right 10/27/2016   Procedure: RIGHT BREAST LUMPECTOMY WITH RADIOACTIVE SEED LOCALIZATION;  Surgeon: IFanny Skates MD;  [facility]  Service: General;  Laterality: Right;   BREAST SURGERY  August 2018   No cancer found   COLONOSCOPY     HERNIA REPAIR  September 2022   VENTRAL HERNIA REPAIR N/A 11/27/2020   Procedure: LAPAROSCOPIC VENTRAL HERNIA W/MESH;  Surgeon: BVirl Cagey MD;  Location: [facility];  Service: General;  Laterality: N/A;   Family History  Problem Relation Age of Onset   Cancer - Other Mother    Cancer Mother  Stroke Father    Hypertension Sister    Stroke Sister    Healthy Daughter    Healthy Son    Social History   Socioeconomic History   Marital status: Widowed    Spouse name: Not on file   Number of children: 2   Years of education: 14   Highest education level: Associate degree: academic program  Occupational History   Occupation: Tax Field seismologist    Comment: Retired  Tobacco Use   Smoking status: Never   Smokeless tobacco: Never  Scientific laboratory technician Use: Never used  Substance and Sexual Activity   Alcohol use: Never   Drug use: Never   Sexual activity:  Not Currently    Birth control/protection: Pill, Post-menopausal  Other Topics Concern   Not on file  Social History Narrative   Lives alone in 2 story home- 2 dogs 4 cats    Daughter lives in [place] Strain: [place]  (05/01/2022)   Overall Financial Resource Strain (CARDIA)    Difficulty of Paying Living Expenses: Not hard at all  Food Insecurity: No Food Insecurity (05/01/2022)   Hunger Vital Sign    Worried About Running Out of Food in the Last Year: Never true    [place] in the Last Year: Never true  Transportation Needs: No Transportation Needs (05/01/2022)   PRAPARE - Transportation    Lack of Transportation (Medical): No    Lack of Transportation (Non-Medical): No  Physical Activity: Insufficiently Active (05/01/2022)   Exercise Vital Sign    Days of Exercise per Week: 3 days    Minutes of Exercise per Session: 30 min  Stress: No Stress Concern Present (05/01/2022)   [place]    Feeling of Stress : Not at all  Social Connections: Moderately Integrated (05/01/2022)   Social Connection and Isolation Panel [NHANES]    Frequency of Communication with Friends and Family: More than three times a week    Frequency of Social Gatherings with Friends and Family: More than three times a week    Attends Religious Services: More than 4 times per year    Active Member of [employer] or Organizations: Yes    Attends Archivist Meetings: More than 4 times per year    Marital Status: Widowed    Tobacco Counseling Counseling given: Not Answered   Clinical Intake:  Pre-visit preparation completed: Yes  Pain : No/denies pain     Nutritional Risks: None Diabetes: No  How often do you need to have someone help you when you read instructions, pamphlets, or other written materials from your doctor or pharmacy?: 1 - Never  Diabetic?no   Interpreter  Needed?: No  Information entered by :: Jadene Pierini, LPN   Activities of Daily Living    05/01/2022    9:54 AM 04/29/2022    1:21 PM  In your present state of health, do you have any difficulty performing the following activities:  Hearing? 0 0  Vision? 0 0  Difficulty concentrating or making decisions? 0 0  Walking or climbing stairs? 0 0  Dressing or bathing? 0 0  Doing errands, shopping? 0 0  Preparing Food and eating ? N N  Using the Toilet? N N  In the past six months, have you accidently leaked urine? N N  Do you have problems with loss of bowel control? N N  Managing your Medications? N N  Managing your Finances? N N  Housekeeping or managing your Housekeeping? N N    Patient Care Team: Chevis Pretty, FNP as PCP - General (Nurse Practitioner) Celestia Khat, OD (Optometry)  Indicate any recent Medical Services you may have received from other than [facility] providers in the past year (date may be approximate).     Assessment:   This is a routine wellness examination for [place].  Hearing/Vision screen Vision Screening - Comments:: Wears rx glasses - up to date with routine eye exams with  Dr.Johnson   Dietary issues and exercise activities discussed: Current Exercise Habits: Home exercise routine, Type of exercise: walking, Time (Minutes): 30, Frequency (Times/Week): 3, Weekly Exercise (Minutes/Week): 90, Intensity: Mild, Exercise limited by: None identified   Goals Addressed             This Visit's Progress    awv   On track    04/25/2019 AWV Goal: Fall Prevention  Over the next year, patient will decrease their risk for falls by: Using assistive devices, such as a cane or walker, as needed Identifying fall risks within their home and correcting them by: Removing throw rugs Adding handrails to stairs or ramps Removing clutter and keeping a clear pathway throughout the home Increasing light, especially at night Adding shower handles/bars Raising  toilet seat Identifying potential personal risk factors for falls: Medication side effects Incontinence/urgency Vestibular dysfunction Hearing loss Musculoskeletal disorders Neurological disorders Orthostatic hypotension  04/25/2019 AWV Goal: Exercise for General Health  Patient will verbalize understanding of the benefits of increased physical activity: Exercising regularly is important. It will improve your overall fitness, flexibility, and endurance. Regular exercise also will improve your overall health. It can help you control your weight, reduce stress, and improve your bone density. Over the next year, patient will increase physical activity as tolerated with a goal of at least 150 minutes of moderate physical activity per week.  You can tell that you are exercising at a moderate intensity if your heart starts beating faster and you start breathing faster but can still hold a conversation. Moderate-intensity exercise ideas include: Walking 1 mile (1.6 km) in about 15 minutes Biking Hiking Golfing Dancing Water aerobics Patient will verbalize understanding of everyday activities that increase physical activity by providing examples like the following: Yard work, such as: Sales promotion account executive Gardening Washing windows or floors Patient will be able to explain general safety guidelines for exercising:  Before you start a new exercise program, talk with your health care provider. Do not exercise so much that you hurt yourself, feel dizzy, or get very short of breath. Wear comfortable clothes and wear shoes with good support. Drink plenty of water while you exercise to prevent dehydration or heat stroke. Work out until your breathing and your heartbeat get faster.        Depression Screen    05/01/2022    9:53 AM 04/27/2022    9:30 AM 10/24/2021    8:18 AM 04/30/2021    9:59 AM 04/24/2021     8:20 AM 10/21/2020   10:15 AM 09/24/2020    3:04 PM  PHQ 2/9 Scores  PHQ - 2 Score 0 0 0 0 0 0 0  PHQ- 9 Score 0 '2 2 2 2 '$ 0 0    Fall Risk    05/01/2022    9:52 AM 04/29/2022    1:21 PM 04/27/2022  9:30 AM 10/24/2021    8:18 AM 06/23/2021    2:18 PM  Fall Risk   Falls in the past year? 0 0 0 0 0  Number falls in past yr: 0      Injury with Fall? 0      Risk for fall due to : No Fall Risks      Follow up Falls prevention discussed    Falls evaluation completed    [place]:  Any stairs in or around the home? Yes  If so, are there any without handrails? No  Home free of loose throw rugs in walkways, pet beds, electrical cords, etc? Yes  Adequate lighting in your home to reduce risk of falls? Yes   ASSISTIVE DEVICES UTILIZED TO PREVENT FALLS:  Life alert? No  Use of a cane, walker or w/c? No  Grab bars in the bathroom? Yes  Shower chair or bench in shower? No  Elevated toilet seat or a handicapped toilet? No       10/05/2017    9:27 AM  MMSE - Mini Mental State Exam  Orientation to time 5  Orientation to Place 5  Registration 3  Attention/ Calculation 5  Recall 3  Language- name 2 objects 2  Language- repeat 1  Language- follow 3 step command 3  Language- read & follow direction 1  Write a sentence 1  Copy design 1  Total score 30        05/01/2022    9:55 AM 04/29/2020    8:58 AM 04/25/2019    9:24 AM  6CIT Screen  What Year? 0 points 0 points 0 points  What month? 0 points 0 points 0 points  What time? 0 points 0 points 0 points  Count back from 20 0 points 0 points 0 points  Months in reverse 0 points 0 points 0 points  Repeat phrase 0 points 0 points 0 points  Total Score 0 points 0 points 0 points    Immunizations Immunization History  Administered Date(s) Administered   Fluad Quad(high Dose 65+) 11/11/2018, 12/07/2019, 01/02/2021, 12/16/2021   Influenza Split 12/29/2012   Influenza, High Dose Seasonal PF  12/10/2016, 12/06/2017   Influenza,inj,Quad PF,6+ Mos 12/12/2013, 12/14/2014   Influenza-Unspecified 12/30/2015   Moderna Sars-Covid-2 Vaccination 04/13/2019, 05/12/2019, 01/26/2020   Pneumococcal Conjugate-13 03/30/2016   Pneumococcal Polysaccharide-23 08/06/2017   Td 08/16/2009   Tdap 04/27/2022   Zoster Recombinat (Shingrix) 04/24/2021, 10/24/2021   Zoster, Live 05/17/2013    TDAP status: Up to date  Flu Vaccine status: Up to date  Pneumococcal vaccine status: Up to date  Covid-19 vaccine status: Completed vaccines  Qualifies for Shingles Vaccine? Yes   Zostavax completed Yes   Shingrix Completed?: Yes  Screening Tests Health Maintenance  Topic Date Due   COVID-19 Vaccine (4 - 2023-24 season) 05/13/2022 (Originally 11/07/2021)   DEXA SCAN  04/28/2023 (Originally 04/23/2022)   MAMMOGRAM  10/17/2022   Medicare Annual Wellness ([place])  05/02/2023   COLONOSCOPY (Pts 45-6yr Insurance coverage will need to be confirmed)  02/15/2024   DTaP/Tdap/Td (3 - Td or Tdap) 04/27/2032   Pneumonia Vaccine 72 Years old  Completed   INFLUENZA VACCINE  Completed   Hepatitis C Screening  Completed   Zoster Vaccines- Shingrix  Completed   HPV VACCINES  Aged Out    Health Maintenance  There are no preventive care reminders to display for this patient.   Colorectal cancer screening: Type of screening: Colonoscopy. Completed  02/15/2019. Repeat every 5 years  Mammogram status: Completed 10/16/2021. Repeat every year  Bone Density status: Completed 04/23/2020. Results reflect: Bone density results: OSTEOPOROSIS. Repeat every 2 years.  Lung Cancer Screening: (Low Dose CT Chest recommended if Age 69-80 years, 30 pack-year currently smoking OR have quit w/in 15years.) does not qualify.   Lung Cancer Screening Referral: n/a  Additional Screening:  Hepatitis C Screening: does not qualify; Completed 03/26/2015  Vision Screening: Recommended annual ophthalmology exams for early detection of  glaucoma and other disorders of the eye. Is the patient up to date with their annual eye exam?  Yes  Who is the provider or what is the name of the office in which the patient attends annual eye exams? Dr.Johnson  If pt is not established with a provider, would they like to be referred to a provider to establish care? No .   Dental Screening: Recommended annual dental exams for proper oral hygiene  Community Resource Referral / Chronic Care Management: CRR required this visit?  No   CCM required this visit?  No      Plan:     I have personally reviewed and noted the following in the patient's chart:   Medical and social history Use of alcohol, tobacco or illicit drugs  Current medications and supplements including opioid prescriptions. Patient is not currently taking opioid prescriptions. Functional ability and status Nutritional status Physical activity Advanced directives List of other physicians Hospitalizations, surgeries, and ER visits in previous 12 months Vitals Screenings to include cognitive, depression, and falls Referrals and appointments  In addition, I have reviewed and discussed with patient certain preventive protocols, quality metrics, and best practice recommendations. A written personalized care plan for preventive services as well as general preventive health recommendations were provided to patient.     Daphane Shepherd, LPN   [id number]   Nurse Notes: none

## 2022-05-01 NOTE — Patient Instructions (Signed)
Lori Ross , Thank you for taking time to come for your Medicare Wellness Visit. I appreciate your ongoing commitment to your health goals. Please review the following plan we discussed and let me know if I can assist you in the future.   These are the goals we discussed:  Goals      awv     04/25/2019 AWV Goal: Fall Prevention  Over the next year, patient will decrease their risk for falls by: Using assistive devices, such as a cane or walker, as needed Identifying fall risks within their home and correcting them by: Removing throw rugs Adding handrails to stairs or ramps Removing clutter and keeping a clear pathway throughout the home Increasing light, especially at night Adding shower handles/bars Raising toilet seat Identifying potential personal risk factors for falls: Medication side effects Incontinence/urgency Vestibular dysfunction Hearing loss Musculoskeletal disorders Neurological disorders Orthostatic hypotension  04/25/2019 AWV Goal: Exercise for General Health  Patient will verbalize understanding of the benefits of increased physical activity: Exercising regularly is important. It will improve your overall fitness, flexibility, and endurance. Regular exercise also will improve your overall health. It can help you control your weight, reduce stress, and improve your bone density. Over the next year, patient will increase physical activity as tolerated with a goal of at least 150 minutes of moderate physical activity per week.  You can tell that you are exercising at a moderate intensity if your heart starts beating faster and you start breathing faster but can still hold a conversation. Moderate-intensity exercise ideas include: Walking 1 mile (1.6 km) in about 15 minutes Biking Hiking Golfing Dancing Water aerobics Patient will verbalize understanding of everyday activities that increase physical activity by providing examples like the following: Yard work,  such as: Sales promotion account executive Gardening Washing windows or floors Patient will be able to explain general safety guidelines for exercising:  Before you start a new exercise program, talk with your health care provider. Do not exercise so much that you hurt yourself, feel dizzy, or get very short of breath. Wear comfortable clothes and wear shoes with good support. Drink plenty of water while you exercise to prevent dehydration or heat stroke. Work out until your breathing and your heartbeat get faster.      Exercise 150 min/wk Moderate Activity        This is a list of the screening recommended for you and due dates:  Health Maintenance  Topic Date Due   COVID-19 Vaccine (4 - 2023-24 season) 05/13/2022*   DEXA scan (bone density measurement)  04/28/2023*   Mammogram  10/17/2022   Medicare Annual Wellness Visit  05/02/2023   Colon Cancer Screening  02/15/2024   DTaP/Tdap/Td vaccine (3 - Td or Tdap) 04/27/2032   Pneumonia Vaccine  Completed   Flu Shot  Completed   Hepatitis C Screening: USPSTF Recommendation to screen - Ages 18-79 yo.  Completed   Zoster (Shingles) Vaccine  Completed   HPV Vaccine  Aged Out  *Topic was postponed. The date shown is not the original due date.    Advanced directives: In Chart  Conditions/risks identified: Aim for 30 minutes of exercise or brisk walking, 6-8 glasses of water, and 5 servings of fruits and vegetables each day.   Next appointment: Follow up in one year for your annual wellness visit    Preventive Care 65 Years and Older, Female Preventive care refers to lifestyle choices and  visits with your health care provider that can promote health and wellness. What does preventive care include? A yearly physical exam. This is also called an annual well check. Dental exams once or twice a year. Routine eye exams. Ask your health care provider how often you should  have your eyes checked. Personal lifestyle choices, including: Daily care of your teeth and gums. Regular physical activity. Eating a healthy diet. Avoiding tobacco and drug use. Limiting alcohol use. Practicing safe sex. Taking low-dose aspirin every day. Taking vitamin and mineral supplements as recommended by your health care provider. What happens during an annual well check? The services and screenings done by your health care provider during your annual well check will depend on your age, overall health, lifestyle risk factors, and family history of disease. Counseling  Your health care provider may ask you questions about your: Alcohol use. Tobacco use. Drug use. Emotional well-being. Home and relationship well-being. Sexual activity. Eating habits. History of falls. Memory and ability to understand (cognition). Work and work Statistician. Reproductive health. Screening  You may have the following tests or measurements: Height, weight, and BMI. Blood pressure. Lipid and cholesterol levels. These may be checked every 5 years, or more frequently if you are over 75 years old. Skin check. Lung cancer screening. You may have this screening every year starting at age 89 if you have a 30-pack-year history of smoking and currently smoke or have quit within the past 15 years. Fecal occult blood test (FOBT) of the stool. You may have this test every year starting at age 25. Flexible sigmoidoscopy or colonoscopy. You may have a sigmoidoscopy every 5 years or a colonoscopy every 10 years starting at age 34. Hepatitis C blood test. Hepatitis B blood test. Sexually transmitted disease (STD) testing. Diabetes screening. This is done by checking your blood sugar (glucose) after you have not eaten for a while (fasting). You may have this done every 1-3 years. Bone density scan. This is done to screen for osteoporosis. You may have this done starting at age 53. Mammogram. This may be done  every 1-2 years. Talk to your health care provider about how often you should have regular mammograms. Talk with your health care provider about your test results, treatment options, and if necessary, the need for more tests. Vaccines  Your health care provider may recommend certain vaccines, such as: Influenza vaccine. This is recommended every year. Tetanus, diphtheria, and acellular pertussis (Tdap, Td) vaccine. You may need a Td booster every 10 years. Zoster vaccine. You may need this after age 26. Pneumococcal 13-valent conjugate (PCV13) vaccine. One dose is recommended after age 66. Pneumococcal polysaccharide (PPSV23) vaccine. One dose is recommended after age 44. Talk to your health care provider about which screenings and vaccines you need and how often you need them. This information is not intended to replace advice given to you by your health care provider. Make sure you discuss any questions you have with your health care provider. Document Released: 03/22/2015 Document Revised: 11/13/2015 Document Reviewed: 12/25/2014 Elsevier Interactive Patient Education  2017 [place] Prevention in the Home Falls can cause injuries. They can happen to people of all ages. There are many things you can do to make your home safe and to help prevent falls. What can I do on the outside of my home? Regularly fix the edges of walkways and driveways and fix any cracks. Remove anything that might make you trip as you walk through a door, such as  a raised step or threshold. Trim any bushes or trees on the path to your home. Use bright outdoor lighting. Clear any walking paths of anything that might make someone trip, such as rocks or tools. Regularly check to see if handrails are loose or broken. Make sure that both sides of any steps have handrails. Any raised decks and porches should have guardrails on the edges. Have any leaves, snow, or ice cleared regularly. Use sand or salt on  walking paths during winter. Clean up any spills in your garage right away. This includes oil or grease spills. What can I do in the bathroom? Use night lights. Install grab bars by the toilet and in the tub and shower. Do not use towel bars as grab bars. Use non-skid mats or decals in the tub or shower. If you need to sit down in the shower, use a plastic, non-slip stool. Keep the floor dry. Clean up any water that spills on the floor as soon as it happens. Remove soap buildup in the tub or shower regularly. Attach bath mats securely with double-sided non-slip rug tape. Do not have throw rugs and other things on the floor that can make you trip. What can I do in the bedroom? Use night lights. Make sure that you have a light by your bed that is easy to reach. Do not use any sheets or blankets that are too big for your bed. They should not hang down onto the floor. Have a firm chair that has side arms. You can use this for support while you get dressed. Do not have throw rugs and other things on the floor that can make you trip. What can I do in the kitchen? Clean up any spills right away. Avoid walking on wet floors. Keep items that you use a lot in easy-to-reach places. If you need to reach something above you, use a strong step stool that has a grab bar. Keep electrical cords out of the way. Do not use floor polish or wax that makes floors slippery. If you must use wax, use non-skid floor wax. Do not have throw rugs and other things on the floor that can make you trip. What can I do with my stairs? Do not leave any items on the stairs. Make sure that there are handrails on both sides of the stairs and use them. Fix handrails that are broken or loose. Make sure that handrails are as long as the stairways. Check any carpeting to make sure that it is firmly attached to the stairs. Fix any carpet that is loose or worn. Avoid having throw rugs at the top or bottom of the stairs. If you do  have throw rugs, attach them to the floor with carpet tape. Make sure that you have a light switch at the top of the stairs and the bottom of the stairs. If you do not have them, ask someone to add them for you. What else can I do to help prevent falls? Wear shoes that: Do not have high heels. Have rubber bottoms. Are comfortable and fit you well. Are closed at the toe. Do not wear sandals. If you use a stepladder: Make sure that it is fully opened. Do not climb a closed stepladder. Make sure that both sides of the stepladder are locked into place. Ask someone to hold it for you, if possible. Clearly mark and make sure that you can see: Any grab bars or handrails. First and last steps. Where the edge  of each step is. Use tools that help you move around (mobility aids) if they are needed. These include: Canes. Walkers. Scooters. Crutches. Turn on the lights when you go into a dark area. Replace any light bulbs as soon as they burn out. Set up your furniture so you have a clear path. Avoid moving your furniture around. If any of your floors are uneven, fix them. If there are any pets around you, be aware of where they are. Review your medicines with your doctor. Some medicines can make you feel dizzy. This can increase your chance of falling. Ask your doctor what other things that you can do to help prevent falls. This information is not intended to replace advice given to you by your health care provider. Make sure you discuss any questions you have with your health care provider. Document Released: 12/20/2008 Document Revised: 08/01/2015 Document Reviewed: 03/30/2014 Elsevier Interactive Patient Education  2017 [employer].

## 2022-05-04 ENCOUNTER — Other Ambulatory Visit: Payer: Medicare Other

## 2022-05-04 DIAGNOSIS — E875 Hyperkalemia: Secondary | ICD-10-CM | POA: Diagnosis not present

## 2022-05-05 LAB — CMP14+EGFR
ALT: 15 IU/L (ref 0–32)
AST: 25 IU/L (ref 0–40)
Albumin/Globulin Ratio: 1.7 (ref 1.2–2.2)
Albumin: 4.6 g/dL (ref 3.8–4.8)
Alkaline Phosphatase: 42 IU/L — ABNORMAL LOW (ref 44–121)
BUN/Creatinine Ratio: 20 (ref 12–28)
BUN: 20 mg/dL (ref 8–27)
Bilirubin Total: 0.5 mg/dL (ref 0.0–1.2)
CO2: 22 mmol/L (ref 20–29)
Calcium: 10.3 mg/dL (ref 8.7–10.3)
Chloride: 104 mmol/L (ref 96–106)
Creatinine, Ser: 1.01 mg/dL — ABNORMAL HIGH (ref 0.57–1.00)
Globulin, Total: 2.7 g/dL (ref 1.5–4.5)
Glucose: 85 mg/dL (ref 70–99)
Potassium: 4.9 mmol/L (ref 3.5–5.2)
Sodium: 142 mmol/L (ref 134–144)
Total Protein: 7.3 g/dL (ref 6.0–8.5)
eGFR: 60 mL/min/{1.73_m2} (ref >59)

## 2022-05-06 ENCOUNTER — Ambulatory Visit (INDEPENDENT_AMBULATORY_CARE_PROVIDER_SITE_OTHER): Payer: Medicare Other

## 2022-05-06 ENCOUNTER — Other Ambulatory Visit: Payer: Self-pay | Admitting: Nurse Practitioner

## 2022-05-06 DIAGNOSIS — Z780 Asymptomatic menopausal state: Secondary | ICD-10-CM | POA: Diagnosis not present

## 2022-05-07 DIAGNOSIS — M8589 Other specified disorders of bone density and structure, multiple sites: Secondary | ICD-10-CM | POA: Diagnosis not present

## 2022-05-07 DIAGNOSIS — Z780 Asymptomatic menopausal state: Secondary | ICD-10-CM | POA: Diagnosis not present

## 2022-08-11 ENCOUNTER — Encounter: Payer: Self-pay | Admitting: Family Medicine

## 2022-08-11 ENCOUNTER — Ambulatory Visit (INDEPENDENT_AMBULATORY_CARE_PROVIDER_SITE_OTHER): Payer: Medicare Other | Admitting: Family Medicine

## 2022-08-11 VITALS — BP 134/88 | HR 89 | Temp 98.7°F | Ht 64.0 in | Wt 142.0 lb

## 2022-08-11 DIAGNOSIS — L03032 Cellulitis of left toe: Secondary | ICD-10-CM

## 2022-08-11 MED ORDER — CEPHALEXIN 500 MG PO CAPS: 500.0000 mg | ORAL_CAPSULE | Freq: Four times a day (QID) | ORAL | 0 refills | Status: AC

## 2022-08-11 NOTE — Patient Instructions (Signed)
Erysipelas  Erysipelas is an infection that affects the skin and tissues near the surface of the skin. It causes the skin to become red, swollen, and painful. The infection is most common on the legs but may also affect other areas, such as the face. With treatment, the infection usually goes away in a few days. If not treated, the infection can spread or lead to other problems, such as collections of pus (abscesses). What are the causes? This condition is caused by bacteria. Most often, it is caused by bacteria called streptococci.  Bacteria may get into the skin through a break in the skin, such as: A cut or scrape. An incision from surgery. A burn. An insect bite. An open sore. A crack in the skin. Sometimes, it is not known how the bacteria infected the skin. What increases the risk? You are more likely to develop this condition if you: Are a young child. Are an older adult. Have a weakened disease-fighting system (immune system), such as having HIV or AIDS. Have diabetes. Drink a lot of alcohol. Had recent surgery. Have a yeast infection of the skin. Have swollen legs. What are the signs or symptoms? The infection causes a reddened area on the skin. This reddened area may: Be painful and swollen. Have a clear border around it. Feel itchy and hot. Develop blisters or abscesses. Other symptoms may include: Fever. Chills. Nausea and vomiting. Swollen glands (lymph nodes), such as in the neck. Headache. Feeling tired (fatigue). Loss of appetite. How is this diagnosed? This condition is diagnosed based on: A physical exam. Your health care provider will examine your skin closely. Your symptoms and medical history. How is this treated? This condition is treated with antibiotic medicine. Symptoms usually get better within a few days after starting antibiotics. Follow these instructions at home: Medicines Take other over-the-counter and prescription medicines only as told by  your health care provider. Take your antibiotic medicine as told by your health care provider. Do not stop taking the antibiotic even if your condition starts to improve. Ask your health care provider if it is safe for you to take medicines for pain as needed, such as acetaminophen or ibuprofen. General instructions  If the affected area is on an arm or leg, raise (elevate) the affected arm or leg above the level of your heart while you are sitting or lying down. Do not put any creams or lotions on the affected area of your skin unless your health care provider tells you to do that. Do not share bedding, towels, or washcloths (linens) with other people. Use only your own linens to prevent the infection from spreading to others. Follow instructions from your health care provider about how to take care of your wound. Make sure you: Wash your hands with soap and water for at least 20 seconds before and after you change your bandage (dressing). If soap and water are not available, use hand sanitizer. Change your dressing as told by your health care provider. Keep all follow-up visits. This is important. Contact a health care provider if: Your symptoms do not improve within 1-2 days of starting treatment. You develop new symptoms. You have a fever. You feel generally sick (malaise) with muscle aches and pains. Get help right away if: Your symptoms get worse. You develop vomiting or diarrhea that does not go away. Your red area gets larger or turns dark in color. You notice red streaks coming from the infected area. Summary Erysipelas is an infection affecting  the skin and tissues near the surface of the skin. It causes the skin to become red, swollen, and painful. This condition is caused by bacteria. Most often, it is caused by bacteria called streptococci. Bacteria may enter through a break in the skin. Sometimes, it is not known how the bacteria infected the skin. This condition is treated  with antibiotic medicine. Symptoms usually get better within a few days after starting antibiotics. This information is not intended to replace advice given to you by your health care provider. Make sure you discuss any questions you have with your health care provider. Document Revised: 07/27/2019 Document Reviewed: 07/26/2019 Elsevier Patient Education  2024 [employer].

## 2022-08-11 NOTE — Progress Notes (Signed)
Subjective: Lori Ross: Lori Pierini, FNP WGN:FAOZH Lori Ross is a 72 y.o. female presenting to clinic today for:  1. Infected big Ross Patient reports onset of erythema, throbbing pain and warmth of the left great Ross about 2 weeks ago.  It seems to wax and wane in severity and actually looks a little less swollen today.  She has tried oral analgesics including NSAIDs, Neosporin, ice but nothing really has improved the symptoms.  She does not recall getting cut.  She denies any injury to the Ross.  She does not get pedicures.  No joint involvement or pain. No fevers.   ROS: Per HPI  No Known Allergies Past Medical History:  Diagnosis Date   Breast mass, right    Breast mass, right 10/27/2016   Hyperlipidemia    Hyperlipidemia    Osteoporosis    Vitamin D insufficiency     Current Outpatient Medications:    atorvastatin (LIPITOR) 40 MG tablet, Take 1 tablet (40 mg total) by mouth daily., Disp: 90 tablet, Rfl: 1   Calcium Carbonate-Vitamin D 600-400 MG-UNIT tablet, Take 1 tablet by mouth daily., Disp: , Rfl:    Cholecalciferol (VITAMIN D3) 2000 UNITS TABS, Take 2,000 Units by mouth daily., Disp: , Rfl:    fenofibrate 160 MG tablet, Take 1 tablet (160 mg total) by mouth daily., Disp: 90 tablet, Rfl: 1   MAGNESIUM PO, Take 1 tablet by mouth daily., Disp: , Rfl:    Omega-3 Fatty Acids (FISH OIL) 1200 MG CAPS, Take 1,200 mg by mouth daily., Disp: , Rfl:  Social History   Socioeconomic History   Marital status: Widowed    Spouse name: Not on file   Number of children: 2   Years of education: 14   Highest education level: Associate degree: academic program  Occupational History   Occupation: Tax Designer, television/film set    Comment: Retired  Tobacco Use   Smoking status: Never   Smokeless tobacco: Never  Building services engineer Use: Never used  Substance and Sexual Activity   Alcohol use: Never   Drug use: Never   Sexual activity: Not Currently    Birth  control/protection: Pill, Post-menopausal  Other Topics Concern   Not on file  Social History Narrative   Lives alone in 2 story home- 2 dogs 4 cats    Daughter lives in [place]   Social Determinants of Health   Financial Resource Strain: Low Risk  (08/10/2022)   Overall Financial Resource Strain (CARDIA)    Difficulty of Paying Living Expenses: Not hard at all  Food Insecurity: No Food Insecurity (08/10/2022)   Hunger Vital Sign    Worried About Running Out of Food in the Last Year: Never true    Ran Out of Food in the Last Year: Never true  Transportation Needs: No Transportation Needs (08/10/2022)   PRAPARE - Administrator, Civil Service (Medical): No    Lack of Transportation (Non-Medical): No  Physical Activity: Unknown (08/10/2022)   Exercise Vital Sign    Days of Exercise per Week: Patient declined    Minutes of Exercise per Session: 30 min  Stress: No Stress Concern Present (08/10/2022)   [employer] of Occupational Health - Occupational Stress Questionnaire    Feeling of Stress : Only a little  Social Connections: Moderately Integrated (08/10/2022)   Social Connection and Isolation Panel [NHANES]    Frequency of Communication with Friends and Family: More than three times a week  Frequency of Social Gatherings with Friends and Family: More than three times a week    Attends Religious Services: More than 4 times per year    Active Member of Clubs or Organizations: Yes    Attends Banker Meetings: More than 4 times per year    Marital Status: Widowed  Intimate Partner Violence: Not At Risk (05/01/2022)   Humiliation, Afraid, Rape, and Kick questionnaire    Fear of Current or Ex-Partner: No    Emotionally Abused: No    Physically Abused: No    Sexually Abused: No   Family History  Problem Relation Age of Onset   Cancer - Other Mother    Cancer Mother    Stroke Father    Hypertension Sister    Stroke Sister    Healthy Daughter    Healthy  Son     Objective: Office vital signs reviewed. BP 134/88   Pulse 89   Temp 98.7 F (37.1 C)   Ht 5\' 4"  (1.626 m)   Wt 142 lb (64.4 kg)   SpO2 96%   BMI 24.37 kg/m   Physical Examination:  General: Awake, alert, well nourished, No acute distress Skin:  Left great Ross: She has soft tissue inflammation, erythema and warmth noted just below the cuticle.  No evidence of paronychia or felon.  No purulence.  No evidence of soft tissue infection along the cuticle.  No tenderness palpation to the joint.  She has full flexion and extension of the great Ross  Assessment/ Plan: 72 y.o. female   Cellulitis of great Ross of left foot - Plan: cephALEXin (KEFLEX) 500 MG capsule  Will treat for erysipelas given the lack of purulence.  Keflex 4 times daily.  Home care instructions reviewed.  If symptoms or not improving, follow-up   No orders of the defined types were placed in this encounter.  No orders of the defined types were placed in this encounter.    Raliegh Ip, DO [facility] [phone]

## 2022-08-17 ENCOUNTER — Ambulatory Visit: Payer: Medicare Other | Admitting: Nurse Practitioner

## 2022-09-15 ENCOUNTER — Ambulatory Visit (INDEPENDENT_AMBULATORY_CARE_PROVIDER_SITE_OTHER): Payer: Medicare Other | Admitting: Nurse Practitioner

## 2022-09-15 ENCOUNTER — Encounter: Payer: Self-pay | Admitting: Nurse Practitioner

## 2022-09-15 ENCOUNTER — Other Ambulatory Visit (HOSPITAL_COMMUNITY): Payer: Self-pay | Admitting: Nurse Practitioner

## 2022-09-15 VITALS — BP 125/82 | HR 72 | Temp 98.0°F | Resp 20 | Ht 64.0 in | Wt 142.0 lb

## 2022-09-15 DIAGNOSIS — M10072 Idiopathic gout, left ankle and foot: Secondary | ICD-10-CM

## 2022-09-15 DIAGNOSIS — E559 Vitamin D deficiency, unspecified: Secondary | ICD-10-CM

## 2022-09-15 DIAGNOSIS — Z6826 Body mass index (BMI) 26.0-26.9, adult: Secondary | ICD-10-CM

## 2022-09-15 DIAGNOSIS — E782 Mixed hyperlipidemia: Secondary | ICD-10-CM | POA: Diagnosis not present

## 2022-09-15 DIAGNOSIS — M85852 Other specified disorders of bone density and structure, left thigh: Secondary | ICD-10-CM

## 2022-09-15 DIAGNOSIS — M25571 Pain in right ankle and joints of right foot: Secondary | ICD-10-CM

## 2022-09-15 DIAGNOSIS — N6489 Other specified disorders of breast: Secondary | ICD-10-CM

## 2022-09-15 DIAGNOSIS — M85851 Other specified disorders of bone density and structure, right thigh: Secondary | ICD-10-CM

## 2022-09-15 DIAGNOSIS — R600 Localized edema: Secondary | ICD-10-CM

## 2022-09-15 MED ORDER — MELOXICAM 15 MG PO TABS
15.0000 mg | ORAL_TABLET | Freq: Every day | ORAL | 0 refills | Status: DC
Start: 2022-09-15 — End: 2022-10-27

## 2022-09-15 MED ORDER — PREDNISONE 20 MG PO TABS: 40.0000 mg | ORAL_TABLET | Freq: Every day | ORAL | 0 refills | Status: AC

## 2022-09-15 NOTE — Progress Notes (Signed)
Subjective:    Patient ID: Lori Ross, female    DOB: 1951-03-07, 72 y.o.   MRN: [id number]   Chief Complaint: Big toe red and painful at times and Right leg and ankle weak in AM    HPI:  Lori Ross is a 72 y.o. who identifies as a female who was assigned female at birth.   Social history: Lives with: by herself Work history: retired   Water engineer in today for follow up of the following chronic medical issues:  1. Mixed hyperlipidemia Does try to watch diet and stays very active. Lab Results  Component Value Date   CHOL 158 04/27/2022   HDL 51 04/27/2022   LDLCALC 83 04/27/2022   TRIG 135 04/27/2022   CHOLHDL 3.1 04/27/2022     2. Peripheral edema Has some by the end of the day.  3. Vitamin D deficiency Is on daily vitamin d supplement  4. Osteopenia of both hips Had dexascan on 05/07/22 with t score of -1.4  5. BMI 26.0-26.9,adult No recent weight changes Wt Readings from Last 3 Encounters:  09/15/22 142 lb (64.4 kg)  08/11/22 142 lb (64.4 kg)  05/01/22 141 lb (64 kg)   BMI Readings from Last 3 Encounters:  09/15/22 24.37 kg/m  08/11/22 24.37 kg/m  05/01/22 24.20 kg/m      New complaints: -Left great toe pain and edema. She was given antibiotic 2 weeks ago and has gotten a little better - has trouble with weight bearing on right leg in mornings. Has to use a cane or walker when she first gets up. Mainly in her right foot and ankle.   No Known Allergies Outpatient Encounter Medications as of 09/15/2022  Medication Sig   atorvastatin (LIPITOR) 40 MG tablet Take 1 tablet (40 mg total) by mouth daily.   Calcium Carbonate-Vitamin D 600-400 MG-UNIT tablet Take 1 tablet by mouth daily.   Cholecalciferol (VITAMIN D3) 2000 UNITS TABS Take 2,000 Units by mouth daily.   fenofibrate 160 MG tablet Take 1 tablet (160 mg total) by mouth daily.   MAGNESIUM PO Take 1 tablet by mouth daily.   Omega-3 Fatty Acids (FISH OIL) 1200 MG CAPS Take 1,200 mg by mouth  daily.   No facility-administered encounter medications on file as of 09/15/2022.    Past Surgical History:  Procedure Laterality Date   BREAST EXCISIONAL BIOPSY Right    papiloma   BREAST LUMPECTOMY WITH RADIOACTIVE SEED LOCALIZATION Right 10/27/2016   Procedure: RIGHT BREAST LUMPECTOMY WITH RADIOACTIVE SEED LOCALIZATION;  Surgeon: Claud Kelp, MD;  Location: [facility];  Service: General;  Laterality: Right;   BREAST SURGERY  August 2018   No cancer found   COLONOSCOPY     HERNIA REPAIR  September 2022   VENTRAL HERNIA REPAIR N/A 11/27/2020   Procedure: LAPAROSCOPIC VENTRAL HERNIA W/MESH;  Surgeon: Lucretia Roers, MD;  Location: [facility];  Service: General;  Laterality: N/A;    Family History  Problem Relation Age of Onset   Cancer - Other Mother    Cancer Mother    Stroke Father    Hypertension Sister    Stroke Sister    Healthy Daughter    Healthy Son       Controlled substance contract: n/a     Review of Systems  Constitutional:  Negative for diaphoresis.  Eyes:  Negative for pain.  Respiratory:  Negative for shortness of breath.   Cardiovascular:  Negative for chest pain, palpitations and leg swelling.  Gastrointestinal:  Negative for abdominal pain.  Endocrine: Negative for polydipsia.  Skin:  Negative for rash.  Neurological:  Negative for dizziness, weakness and headaches.  Hematological:  Does not bruise/bleed easily.  All other systems reviewed and are negative.      Objective:   Physical Exam Vitals and nursing note reviewed.  Constitutional:      General: She is not in acute distress.    Appearance: Normal appearance. She is well-developed.  HENT:     Head: Normocephalic.     Right Ear: Tympanic membrane normal.     Left Ear: Tympanic membrane normal.     Nose: Nose normal.     Mouth/Throat:     Mouth: Mucous membranes are moist.  Eyes:     Extraocular Movements: Extraocular movements intact.     Pupils: Pupils are  equal, round, and reactive to light.  Neck:     Vascular: No carotid bruit or JVD.  Cardiovascular:     Rate and Rhythm: Normal rate and regular rhythm.     Heart sounds: Normal heart sounds.  Pulmonary:     Effort: Pulmonary effort is normal. No respiratory distress.     Breath sounds: Normal breath sounds. No wheezing or rales.  Chest:     Chest wall: No tenderness.  Abdominal:     General: Bowel sounds are normal. There is no distension or abdominal bruit.     Palpations: Abdomen is soft. There is no hepatomegaly, splenomegaly, mass or pulsatile mass.     Tenderness: There is no abdominal tenderness.  Musculoskeletal:        General: Normal range of motion.     Cervical back: Normal range of motion and neck supple.  Lymphadenopathy:     Cervical: No cervical adenopathy.  Skin:    General: Skin is warm and dry.  Neurological:     Mental Status: She is alert and oriented to person, place, and time.     Deep Tendon Reflexes: Reflexes are normal and symmetric.  Psychiatric:        Behavior: Behavior normal.        Thought Content: Thought content normal.        Judgment: Judgment normal.    BP 125/82   Pulse 72   Temp 98 F (36.7 C) (Temporal)   Resp 20   Ht 5\' 4"  (1.626 m)   Wt 142 lb (64.4 kg)   SpO2 100%   BMI 24.37 kg/m         Assessment & Plan:   IMAJEAN Ross comes in today with chief complaint of Big toe red and painful at times and Right leg and ankle weak in AM   Diagnosis and orders addressed:  1. Mixed hyperlipidemia Low fat diet  2. Peripheral edema Elevate legs when sitting  3. Vitamin D deficiency Continue vitamin d supplement  4. Osteopenia of both hips Weight bearing exercise  5. BMI 26.0-26.9,adult Discussed diet and exercise for person with BMI >25 Will recheck weight in 3-6 months   6. Acute idiopathic gout involving toe of left foot Steroids daily for 5 days  7. Acute right ankle pain Mobic daily after finish  steroids  Meds ordered this encounter  Medications   predniSONE (DELTASONE) 20 MG tablet    Sig: Take 2 tablets (40 mg total) by mouth daily with breakfast for 5 days. 2 po daily for 5 days    Dispense:  10 tablet    Refill:  0  Order Specific Question:   Supervising Provider    Answer:   Arville Care A [[id number]]   meloxicam (MOBIC) 15 MG tablet    Sig: Take 1 tablet (15 mg total) by mouth daily.    Dispense:  30 tablet    Refill:  0    Order Specific Question:   Supervising Provider    Answer:   Arville Care A [id number]     Labs pending Health Maintenance reviewed Diet and exercise encouraged  Follow up plan: 1 month   Mary-Margaret Daphine Deutscher, FNP

## 2022-09-15 NOTE — Patient Instructions (Signed)
Gout  Gout is a condition that causes painful swelling of the joints. Gout is a type of inflammation of the joints (arthritis). This condition is caused by having too much uric acid in the body. Uric acid is a chemical that forms when the body breaks down substances called purines. Purines are important for building body proteins. When the body has too much uric acid, sharp crystals can form and build up inside the joints. This causes pain and swelling. Gout attacks can happen quickly and may be very painful (acute gout). Over time, the attacks can affect more joints and become more frequent (chronic gout). Gout can also cause uric acid to build up under the skin and inside the kidneys. What are the causes? This condition is caused by too much uric acid in your blood. This can happen because: Your kidneys do not remove enough uric acid from your blood. This is the most common cause. Your body makes too much uric acid. This can happen with some cancers and cancer treatments. It can also occur if your body is breaking down too many red blood cells (hemolytic anemia). You eat too many foods that are high in purines. These foods include organ meats and some seafood. Alcohol, especially beer, is also high in purines. A gout attack may be triggered by trauma or stress. What increases the risk? The following factors may make you more likely to develop this condition: Having a family history of gout. Being female and middle-aged. Being female and having gone through menopause. Taking certain medicines, including aspirin, cyclosporine, diuretics, levodopa, and niacin. Having an organ transplant. Having certain conditions, such as: Being obese. Lead poisoning. Kidney disease. A skin condition called psoriasis. Other factors include: Losing weight too quickly. Being dehydrated. Frequently drinking alcohol, especially beer. Frequently drinking beverages that are sweetened with a type of sugar called  fructose. What are the signs or symptoms? An attack of acute gout happens quickly. It usually occurs in just one joint. The most common place is the big toe. Attacks often start at night. Other joints that may be affected include joints of the feet, ankle, knee, fingers, wrist, or elbow. Symptoms of this condition may include: Severe pain. Warmth. Swelling. Stiffness. Tenderness. The affected joint may be very painful to touch. Shiny, red, or purple skin. Chills and fever. Chronic gout may cause symptoms more frequently. More joints may be involved. You may also have white or yellow lumps (tophi) on your hands or feet or in other areas near your joints. How is this diagnosed? This condition is diagnosed based on your symptoms, your medical history, and a physical exam. You may have tests, such as: Blood tests to measure uric acid levels. Removal of joint fluid with a thin needle (aspiration) to look for uric acid crystals. X-rays to look for joint damage. How is this treated? Treatment for this condition has two phases: treating an acute attack and preventing future attacks. Acute gout treatment may include medicines to reduce pain and swelling, including: NSAIDs, such as ibuprofen. Steroids. These are strong anti-inflammatory medicines that can be taken by mouth (orally) or injected into a joint. Colchicine. This medicine relieves pain and swelling when it is taken soon after an attack. It can be given by mouth or through an IV. Preventive treatment may include: Daily use of smaller doses of NSAIDs or colchicine. Use of a medicine that reduces uric acid levels in your blood, such as allopurinol. Changes to your diet. You may need to see   a dietitian about what to eat and drink to prevent gout. Follow these instructions at home: During a gout attack  If directed, put ice on the affected area. To do this: Put ice in a plastic bag. Place a towel between your skin and the bag. Leave the  ice on for 20 minutes, 2-3 times a day. Remove the ice if your skin turns bright red. This is very important. If you cannot feel pain, heat, or cold, you have a greater risk of damage to the area. Raise (elevate) the affected joint above the level of your heart as often as possible. Rest the joint as much as possible. If the affected joint is in your leg, you may be given crutches to use. Follow instructions from your health care provider about eating or drinking restrictions. Avoiding future gout attacks Follow a low-purine diet as told by your dietitian or health care provider. Avoid foods and drinks that are high in purines, including liver, kidney, anchovies, asparagus, herring, mushrooms, mussels, and beer. Maintain a healthy weight or lose weight if you are overweight. If you want to lose weight, talk with your health care provider. Do not lose weight too quickly. Start or maintain an exercise program as told by your health care provider. Eating and drinking Avoid drinking beverages that contain fructose. Drink enough fluids to keep your urine pale yellow. If you drink alcohol: Limit how much you have to: 0-1 drink a day for women who are not pregnant. 0-2 drinks a day for men. Know how much alcohol is in a drink. In the [place] one drink equals one 12 oz bottle of beer (355 mL), one 5 oz glass of wine (148 mL), or one 1 oz glass of hard liquor (44 mL). General instructions Take over-the-counter and prescription medicines only as told by your health care provider. Ask your health care provider if the medicine prescribed to you requires you to avoid driving or using machinery. Return to your normal activities as told by your health care provider. Ask your health care provider what activities are safe for you. Keep all follow-up visits. This is important. Where to find more information National Institutes of Health: www.niams.nih.gov Contact a health care provider if you have: Another  gout attack. Continuing symptoms of a gout attack after 10 days of treatment. Side effects from your medicines. Chills or a fever. Burning pain when you urinate. Pain in your lower back or abdomen. Get help right away if you: Have severe or uncontrolled pain. Cannot urinate. Summary Gout is painful swelling of the joints caused by having too much uric acid in the body. The most common site for gout to occur is in the big toe, but it can affect other joints in the body. Medicines and dietary changes can help to prevent and treat gout attacks. This information is not intended to replace advice given to you by your health care provider. Make sure you discuss any questions you have with your health care provider. Document Revised: 11/27/2020 Document Reviewed: 11/27/2020 Elsevier Patient Education  2024 Elsevier Inc.  

## 2022-10-20 ENCOUNTER — Ambulatory Visit (HOSPITAL_COMMUNITY): Admission: RE | Admit: 2022-10-20 | Payer: Medicare Other | Source: Ambulatory Visit

## 2022-10-20 ENCOUNTER — Ambulatory Visit (HOSPITAL_COMMUNITY)
Admission: RE | Admit: 2022-10-20 | Discharge: 2022-10-20 | Disposition: A | Discharge status: Home / Self Care | Payer: Medicare Other | Source: Ambulatory Visit | Attending: Nurse Practitioner | Admitting: Nurse Practitioner

## 2022-10-20 ENCOUNTER — Encounter (HOSPITAL_COMMUNITY): Payer: Self-pay

## 2022-10-20 DIAGNOSIS — N6489 Other specified disorders of breast: Secondary | ICD-10-CM

## 2022-10-20 DIAGNOSIS — R928 Other abnormal and inconclusive findings on diagnostic imaging of breast: Secondary | ICD-10-CM | POA: Diagnosis not present

## 2022-10-20 DIAGNOSIS — R92323 Mammographic fibroglandular density, bilateral breasts: Secondary | ICD-10-CM | POA: Diagnosis not present

## 2022-10-27 ENCOUNTER — Encounter: Payer: Self-pay | Admitting: Nurse Practitioner

## 2022-10-27 ENCOUNTER — Ambulatory Visit (INDEPENDENT_AMBULATORY_CARE_PROVIDER_SITE_OTHER): Payer: Medicare Other | Admitting: Nurse Practitioner

## 2022-10-27 ENCOUNTER — Ambulatory Visit (INDEPENDENT_AMBULATORY_CARE_PROVIDER_SITE_OTHER): Payer: Medicare Other

## 2022-10-27 VITALS — BP 144/86 | HR 64 | Temp 97.2°F | Resp 20 | Ht 64.0 in | Wt 139.0 lb

## 2022-10-27 DIAGNOSIS — M25551 Pain in right hip: Secondary | ICD-10-CM | POA: Diagnosis not present

## 2022-10-27 DIAGNOSIS — Z6826 Body mass index (BMI) 26.0-26.9, adult: Secondary | ICD-10-CM

## 2022-10-27 DIAGNOSIS — M85852 Other specified disorders of bone density and structure, left thigh: Secondary | ICD-10-CM | POA: Diagnosis not present

## 2022-10-27 DIAGNOSIS — E782 Mixed hyperlipidemia: Secondary | ICD-10-CM | POA: Diagnosis not present

## 2022-10-27 DIAGNOSIS — R600 Localized edema: Secondary | ICD-10-CM

## 2022-10-27 DIAGNOSIS — M85851 Other specified disorders of bone density and structure, right thigh: Secondary | ICD-10-CM | POA: Diagnosis not present

## 2022-10-27 DIAGNOSIS — M1611 Unilateral primary osteoarthritis, right hip: Secondary | ICD-10-CM | POA: Diagnosis not present

## 2022-10-27 DIAGNOSIS — E559 Vitamin D deficiency, unspecified: Secondary | ICD-10-CM | POA: Diagnosis not present

## 2022-10-27 LAB — CBC WITH DIFFERENTIAL/PLATELET
Basophils Absolute: 0 10*3/uL (ref 0.0–0.2)
Basos: 1 %
EOS (ABSOLUTE): 0.1 10*3/uL (ref 0.0–0.4)
Eos: 2 %
Hematocrit: 42.6 % (ref 34.0–46.6)
Hemoglobin: 14 g/dL (ref 11.1–15.9)
Immature Grans (Abs): 0 10*3/uL (ref 0.0–0.1)
Immature Granulocytes: 0 %
Lymphocytes Absolute: 1.5 10*3/uL (ref 0.7–3.1)
Lymphs: 34 %
MCH: 29.2 pg (ref 26.6–33.0)
MCHC: 32.9 g/dL (ref 31.5–35.7)
MCV: 89 fL (ref 79–97)
Monocytes Absolute: 0.3 10*3/uL (ref 0.1–0.9)
Monocytes: 8 %
Neutrophils Absolute: 2.4 10*3/uL (ref 1.4–7.0)
Neutrophils: 55 %
Platelets: 330 10*3/uL (ref 150–450)
RBC: 4.8 x10E6/uL (ref 3.77–5.28)
RDW: 13.3 % (ref 11.7–15.4)
WBC: 4.4 10*3/uL (ref 3.4–10.8)

## 2022-10-27 LAB — CMP14+EGFR
ALT: 18 IU/L (ref 0–32)
AST: 23 IU/L (ref 0–40)
Albumin: 4.6 g/dL (ref 3.8–4.8)
Alkaline Phosphatase: 35 IU/L — ABNORMAL LOW (ref 44–121)
BUN/Creatinine Ratio: 22 (ref 12–28)
BUN: 19 mg/dL (ref 8–27)
Bilirubin Total: 0.5 mg/dL (ref 0.0–1.2)
CO2: 23 mmol/L (ref 20–29)
Calcium: 9.8 mg/dL (ref 8.7–10.3)
Chloride: 105 mmol/L (ref 96–106)
Creatinine, Ser: 0.88 mg/dL (ref 0.57–1.00)
Globulin, Total: 2.4 g/dL (ref 1.5–4.5)
Glucose: 93 mg/dL (ref 70–99)
Potassium: 4.4 mmol/L (ref 3.5–5.2)
Sodium: 142 mmol/L (ref 134–144)
Total Protein: 7 g/dL (ref 6.0–8.5)
eGFR: 70 mL/min/{1.73_m2} (ref >59)

## 2022-10-27 LAB — LIPID PANEL
Chol/HDL Ratio: 3.3 ratio (ref 0.0–4.4)
Cholesterol, Total: 153 mg/dL (ref 100–199)
HDL: 47 mg/dL (ref >39)
LDL Chol Calc (NIH): 88 mg/dL (ref 0–99)
Triglycerides: 97 mg/dL (ref 0–149)
VLDL Cholesterol Cal: 18 mg/dL (ref 5–40)

## 2022-10-27 MED ORDER — FENOFIBRATE 160 MG PO TABS: 160.0000 mg | ORAL_TABLET | Freq: Every day | ORAL | 1 refills | Status: DC

## 2022-10-27 MED ORDER — MELOXICAM 15 MG PO TABS
15.0000 mg | ORAL_TABLET | Freq: Every day | ORAL | 1 refills | Status: DC
Start: 2022-10-27 — End: 2023-04-15

## 2022-10-27 MED ORDER — ATORVASTATIN CALCIUM 40 MG PO TABS: 40.0000 mg | ORAL_TABLET | Freq: Every day | ORAL | 1 refills | Status: DC

## 2022-10-27 NOTE — Progress Notes (Signed)
Subjective:    Patient ID: Lori Ross, female    DOB: [birth year], 72 y.o.   MRN: [id number]   Chief Complaint: medical management of chronic issues     HPI:  Lori Ross is a 72 y.o. who identifies as a female who was assigned female at birth.   Social history: Lives with: by herself Work history: retired   Water engineer in today for follow up of the following chronic medical issues:  1. Mixed hyperlipidemia Does try to watch diet and exercises several times a week. Lab Results  Component Value Date   CHOL 158 04/27/2022   HDL 51 04/27/2022   LDLCALC 83 04/27/2022   TRIG 135 04/27/2022   CHOLHDL 3.1 04/27/2022     2. Peripheral edema Has some lower ext edema usually by the end of the day.  3. Osteopenia of both hips Last dexascan was done on 05/07/22. T score was - 1.8.  4. Vitamin D deficiency Is on a daily vitamin d supplement  5. BMI 26.0-26.9,adult No recent weight changes Wt Readings from Last 3 Encounters:  10/27/22 139 lb (63 kg)  09/15/22 142 lb (64.4 kg)  08/11/22 142 lb (64.4 kg)   BMI Readings from Last 3 Encounters:  10/27/22 23.86 kg/m  09/15/22 24.37 kg/m  08/11/22 24.37 kg/m     New complaints: Right hip pain. Started several weeks ago. Rates pain 4-5/10. Was taking mobic which seemed to help. Pain stays in hip. Doe snot radiate. Pain is worse in mornings when she gets up.   No Known Allergies Outpatient Encounter Medications as of 10/27/2022  Medication Sig   atorvastatin (LIPITOR) 40 MG tablet Take 1 tablet (40 mg total) by mouth daily.   Calcium Carbonate-Vitamin D 600-400 MG-UNIT tablet Take 1 tablet by mouth daily.   Cholecalciferol (VITAMIN D3) 2000 UNITS TABS Take 2,000 Units by mouth daily.   fenofibrate 160 MG tablet Take 1 tablet (160 mg total) by mouth daily.   MAGNESIUM PO Take 1 tablet by mouth daily.   meloxicam (MOBIC) 15 MG tablet Take 1 tablet (15 mg total) by mouth daily.   Omega-3 Fatty Acids (FISH OIL) 1200 MG  CAPS Take 1,200 mg by mouth daily.   No facility-administered encounter medications on file as of 10/27/2022.    Past Surgical History:  Procedure Laterality Date   BREAST EXCISIONAL BIOPSY Right    papiloma   BREAST LUMPECTOMY WITH RADIOACTIVE SEED LOCALIZATION Right 10/27/2016   Procedure: RIGHT BREAST LUMPECTOMY WITH RADIOACTIVE SEED LOCALIZATION;  Surgeon: Claud Kelp, MD;  Location: [facility];  Service: General;  Laterality: Right;   BREAST SURGERY  August 2018   No cancer found   COLONOSCOPY     HERNIA REPAIR  September 2022   VENTRAL HERNIA REPAIR N/A 11/27/2020   Procedure: LAPAROSCOPIC VENTRAL HERNIA W/MESH;  Surgeon: Lucretia Roers, MD;  Location: [facility];  Service: General;  Laterality: N/A;    Family History  Problem Relation Age of Onset   Cancer - Other Mother    Cancer Mother    Stroke Father 58   Hypertension Sister    Stroke Sister 28   Healthy Daughter    Healthy Son       Controlled substance contract: n/a     Review of Systems  Constitutional:  Negative for diaphoresis.  Eyes:  Negative for pain.  Respiratory:  Negative for shortness of breath.   Cardiovascular:  Negative for chest pain, palpitations and leg swelling.  Gastrointestinal:  Negative for abdominal pain.  Endocrine: Negative for polydipsia.  Musculoskeletal:  Positive for arthralgias (right hip).  Skin:  Negative for rash.  Neurological:  Negative for dizziness, weakness and headaches.  Hematological:  Does not bruise/bleed easily.  All other systems reviewed and are negative.      Objective:   Physical Exam Vitals and nursing note reviewed.  Constitutional:      General: She is not in acute distress.    Appearance: Normal appearance. She is well-developed.  HENT:     Head: Normocephalic.     Right Ear: Tympanic membrane normal.     Left Ear: Tympanic membrane normal.     Nose: Nose normal.     Mouth/Throat:     Mouth: Mucous membranes are moist.   Eyes:     Pupils: Pupils are equal, round, and reactive to light.  Neck:     Vascular: No carotid bruit or JVD.  Cardiovascular:     Rate and Rhythm: Normal rate and regular rhythm.     Heart sounds: Normal heart sounds.  Pulmonary:     Effort: Pulmonary effort is normal. No respiratory distress.     Breath sounds: Normal breath sounds. No wheezing or rales.  Chest:     Chest wall: No tenderness.  Abdominal:     General: Bowel sounds are normal. There is no distension or abdominal bruit.     Palpations: Abdomen is soft. There is no hepatomegaly, splenomegaly, mass or pulsatile mass.     Tenderness: There is no abdominal tenderness.  Musculoskeletal:        General: Normal range of motion.     Cervical back: Normal range of motion and neck supple.     Comments: From of right hip with no pain- pain in hip when laying down and rising  Lymphadenopathy:     Cervical: No cervical adenopathy.  Skin:    General: Skin is warm and dry.  Neurological:     Mental Status: She is alert and oriented to person, place, and time.     Deep Tendon Reflexes: Reflexes are normal and symmetric.  Psychiatric:        Behavior: Behavior normal.        Thought Content: Thought content normal.        Judgment: Judgment normal.     BP (!) 144/86   Pulse 64   Temp (!) 97.2 F (36.2 C) (Temporal)   Resp 20   Ht 5\' 4"  (1.626 m)   Wt 139 lb (63 kg)   SpO2 98%   BMI 23.86 kg/m    Right hip- degenerative changes -Preliminary reading by Paulene Floor, FNP  [facility]      Assessment & Plan:   Lori Ross comes in today with chief complaint of Medical Management of Chronic Issues   Diagnosis and orders addressed:  1. Mixed hyperlipidemia Low fat diet - atorvastatin (LIPITOR) 40 MG tablet; Take 1 tablet (40 mg total) by mouth daily.  Dispense: 90 tablet; Refill: 1 - fenofibrate 160 MG tablet; Take 1 tablet (160 mg total) by mouth daily.  Dispense: 90 tablet; Refill: 1  2. Peripheral  edema Elevate legs when sitting  3. Osteopenia of both hips Weight bearing exercises  4. Vitamin D deficiency Continue daily vitamin d supplement  5. BMI 26.0-26.9,adult Discussed diet and exercise for person with BMI >25 Will recheck weight in 3-6 months   6. Right hip pain Moist heat Rest Continue mobic as prescribed -  DG HIP UNILAT W OR W/O PELVIS 2-3 VIEWS RIGHT - meloxicam (MOBIC) 15 MG tablet; Take 1 tablet (15 mg total) by mouth daily.  Dispense: 90 tablet; Refill: 1   Labs pending Health Maintenance reviewed Diet and exercise encouraged  Follow up plan: 6 months   Mary-Margaret Daphine Deutscher, FNP

## 2022-10-27 NOTE — Patient Instructions (Signed)
Hip Pain The hip is the joint between the upper legs and the lower pelvis. The bones, cartilage, tendons, and muscles of your hip joint support your body and allow you to move around. Hip pain can range from a minor ache to severe pain in one or both of your hips. The pain may be felt on the inside of the hip joint near the groin, or on the outside near the buttocks and upper thigh. You may also have swelling or stiffness in your hip area. Follow these instructions at home: Managing pain, stiffness, and swelling     If told, put ice on the painful area. Put ice in a plastic bag. Place a towel between your skin and the bag. Leave the ice on for 20 minutes, 2-3 times a day. If told, apply heat to the affected area as often as told by your health care provider. Use the heat source that your provider recommends, such as a moist heat pack or a heating pad. Place a towel between your skin and the heat source. Leave the heat on for 20-30 minutes. If your skin turns bright red, remove the ice or heat right away to prevent skin damage. The risk of damage is higher if you cannot feel pain, heat, or cold. Activity Do exercises as told by your provider. Avoid activities that cause pain. General instructions  Take over-the-counter and prescription medicines only as told by your provider. Keep a journal of your symptoms. Write down: How often you have hip pain. The location of your pain. What the pain feels like. What makes the pain worse. Sleep with a pillow between your legs on your most comfortable side. Keep all follow-up visits. Your provider will monitor your pain and activity. Contact a health care provider if: You cannot put weight on your leg. Your pain or swelling gets worse after a week. It gets harder to walk. You have a fever. Get help right away if: You fall. You have a sudden increase in pain and swelling in your hip. Your hip is red or swollen or very tender to touch. This  information is not intended to replace advice given to you by your health care provider. Make sure you discuss any questions you have with your health care provider. Document Revised: 10/28/2021 Document Reviewed: 10/28/2021 Elsevier Patient Education  2024 Elsevier Inc.  

## 2022-12-22 ENCOUNTER — Ambulatory Visit (INDEPENDENT_AMBULATORY_CARE_PROVIDER_SITE_OTHER): Payer: Medicare Other

## 2022-12-22 DIAGNOSIS — Z23 Encounter for immunization: Secondary | ICD-10-CM

## 2023-01-11 ENCOUNTER — Ambulatory Visit (INDEPENDENT_AMBULATORY_CARE_PROVIDER_SITE_OTHER): Payer: Medicare Other | Admitting: Nurse Practitioner

## 2023-01-11 ENCOUNTER — Encounter: Payer: Self-pay | Admitting: Nurse Practitioner

## 2023-01-11 VITALS — BP 153/84 | HR 69 | Temp 97.8°F | Resp 20 | Ht 64.0 in | Wt 140.0 lb

## 2023-01-11 DIAGNOSIS — N814 Uterovaginal prolapse, unspecified: Secondary | ICD-10-CM

## 2023-01-11 DIAGNOSIS — R1031 Right lower quadrant pain: Secondary | ICD-10-CM | POA: Diagnosis not present

## 2023-01-11 NOTE — Progress Notes (Signed)
Subjective:    Patient ID: Lori Ross, female    DOB: [birth year], 72 y.o.   MRN: [id number]   Chief Complaint: Abdominal Pain and protrusion from vagina   Abdominal Pain Pertinent negatives include no constipation, diarrhea, headaches, nausea or vomiting.    Patient in c/o: - abdominal pain on right. Started a while back , she has hernia surgery and pain is still there. Today rates pain 2/10 currently but can go as high as 5/10. Says pain is like a "pulling "sensation. Wearing a compression wrap around abdomen makes it fell better.   - something protruding for vaginal area. Noticed it last week. No pain or discomfort there. No vaginal discharge.  Patient Active Problem List   Diagnosis Date Noted   Ventral hernia without obstruction or gangrene 11/14/2020   BMI 26.0-26.9,adult 12/14/2014   Peripheral edema 12/14/2014   Osteopenia 06/30/2010   Hyperlipidemia 06/30/2010   Vitamin D deficiency 06/30/2010       Review of Systems  Constitutional:  Negative for diaphoresis.  Eyes:  Negative for pain.  Respiratory:  Negative for shortness of breath.   Cardiovascular:  Negative for chest pain, palpitations and leg swelling.  Gastrointestinal:  Positive for abdominal pain. Negative for blood in stool, constipation, diarrhea, nausea, rectal pain and vomiting.  Endocrine: Negative for polydipsia.  Skin:  Negative for rash.  Neurological:  Negative for dizziness, weakness and headaches.  Hematological:  Does not bruise/bleed easily.  All other systems reviewed and are negative.      Objective:   Physical Exam Constitutional:      Appearance: She is well-developed.  Cardiovascular:     Rate and Rhythm: Normal rate.  Pulmonary:     Effort: Pulmonary effort is normal.     Breath sounds: Normal breath sounds.  Abdominal:     General: Abdomen is flat. Bowel sounds are normal. There is no distension. There are no signs of injury.     Palpations: There is no mass.      Tenderness: There is abdominal tenderness in the right lower quadrant. There is no guarding or rebound. Negative signs include Murphy's sign, McBurney's sign and psoas sign.     Hernia: No hernia is present.  Genitourinary:    Vagina: No signs of injury and foreign body. No vaginal discharge, erythema, tenderness or bleeding.     Cervix: Normal.     Uterus: Deviated.      Adnexa: Right adnexa normal.     Rectum: Normal.  Skin:    General: Skin is warm.  Neurological:     General: No focal deficit present.     Mental Status: She is alert and oriented to person, place, and time.  Psychiatric:        Mood and Affect: Mood normal.        Behavior: Behavior normal.     BP (!) 153/84   Pulse 69   Temp 97.8 F (36.6 C) (Temporal)   Resp 20   Ht 5\' 4"  (1.626 m)   Wt 140 lb (63.5 kg)   SpO2 97%   BMI 24.03 kg/m        Assessment & Plan:  Lori Ross in today with chief complaint of Abdominal Pain and protrusion from vagina   1. Right lower quadrant pain Will schedule ultra souns - US Pelvis Complete; Future  2. Uterine prolapse Patient does not want to do anything at this time    The above assessment and management plan  was discussed with the patient. The patient verbalized understanding of and has agreed to the management plan. Patient is aware to call the clinic if symptoms persist or worsen. Patient is aware when to return to the clinic for a follow-up visit. Patient educated on when it is appropriate to go to the emergency department.   Mary-Margaret Daphine Deutscher, FNP

## 2023-01-11 NOTE — Patient Instructions (Signed)
Pelvic Organ Prolapse Pelvic organ prolapse is a condition in women that involves the stretching, bulging, or dropping of pelvic organs into an abnormal position, past the opening of the vagina. It happens when the muscles and tissues that surround and support pelvic structures become weak or stretched. Pelvic organ prolapse can involve the: Vagina (vaginal prolapse). Uterus (uterine prolapse). Bladder (cystocele). Rectum (rectocele). Intestines (enterocele). When organs other than the vagina are involved, they often bulge into the vagina or protrude from the vagina, depending on how severe the prolapse is. What are the causes? This condition may be caused by: Pregnancy, labor, and childbirth. Past pelvic surgery. Lower levels of the hormone estrogen due to menopause. Consistently lifting more than 50 lb (23 kg). Obesity. Long-term difficulty passing stool (chronic constipation). Long-term, or chronic, cough. Fluid buildup in the abdomen due to certain conditions. What are the signs or symptoms? Symptoms of this condition include: Leaking a little urine (loss of bladder control) when you cough, sneeze, strain, and exercise (stress incontinence). This may be worse immediately after childbirth. It may gradually improve over time. Feeling pressure in your pelvis or vagina. This pressure may increase when you cough or when you are passing stool. A bulge that protrudes from the opening of your vagina. Difficulty passing urine or stool. Pain in your lower back. Pain or discomfort during sex, or decreased interest in sex. Repeated bladder infections (urinary tract infections). Difficulty inserting a tampon. In some people, this condition causes no symptoms. How is this diagnosed? This condition may be diagnosed based on a vaginal and rectal exam. During the exam, you may be asked to cough and strain while you are lying down, sitting, and standing up. Your health care provider will determine if  other tests are required, such as bladder function tests. How is this treated? Treatment for this condition may depend on your symptoms. Treatment may include: Lifestyle changes, such as drinking plenty of fluids and eating foods that are high in fiber. Emptying your bladder at scheduled times (bladder training therapy). This can help reduce or avoid urinary incontinence. Estrogen. This may help mild prolapse by increasing the strength and tone of pelvic floor muscles. Kegel exercises. These may help mild cases of prolapse by strengthening and tightening the muscles of the pelvic floor. A soft, flexible device that helps support the vaginal walls and keep pelvic organs in place (pessary). This is inserted into your vagina by your health care provider. Surgery. This is often the only form of treatment for severe prolapse. Follow these instructions at home: Eating and drinking Avoid drinking beverages that contain caffeine or alcohol. Increase your intake of high-fiber foods to decrease constipation and straining during bowel movements. Activity Lose weight if recommended by your health care provider. Avoid heavy lifting and straining with exercise and work. Do not hold your breath when you perform mild to moderate lifting and exercise activities. Limit your activities as directed by your health care provider. Do Kegel exercises as directed by your health care provider. To do this: Squeeze your pelvic floor muscles tight. You should feel a tight lift in your rectal area and a tightness in your vaginal area. Keep your stomach, buttocks, and legs relaxed. Hold the muscles tight for up to 10 seconds. Then relax your muscles. Repeat this exercise 50 times a day, or as much as told by your health care provider. Continue to do this exercise for at least 4-6 weeks, or for as long as told by your health care provider.  General instructions Take over-the-counter and prescription medicines only as told by  your health care provider. Wear a sanitary pad or adult diapers if you have urinary incontinence. If you have a pessary, take care of it as told by your health care provider. Keep all follow-up visits. This is important. Contact a health care provider if you: Have symptoms that interfere with your daily activities or sex life. Need medicine to help with the discomfort. Notice bleeding from your vagina that is not related to your menstrual period. Have a fever. Have pain or bleeding when you urinate. Have bleeding when you pass stool. Pass urine when you have sex. Have chronic constipation. Have a pessary that falls out. Have a foul-smelling vaginal discharge. Have an unusual, low pain in your abdomen. Get help right away if you: Cannot pass urine. Summary Pelvic organ prolapse is the stretching, bulging, or dropping of pelvic organs into an abnormal position. It happens when the muscles and tissues that surround and support pelvic structures become weak or stretched. When organs other than the vagina are involved, they often bulge into the vagina or protrude from it, depending on how severe the prolapse is. In most cases, this condition needs to be treated only if it produces symptoms. Treatment may include lifestyle changes, estrogen, Kegel exercises, pessary insertion, or surgery. Avoid heavy lifting and straining with exercise and work. Do not hold your breath when you perform mild to moderate lifting and exercise activities. Limit your activities as directed by your health care provider. This information is not intended to replace advice given to you by your health care provider. Make sure you discuss any questions you have with your health care provider. Document Revised: 08/21/2019 Document Reviewed: 08/21/2019 Elsevier Patient Education  2024 [employer].

## 2023-01-19 ENCOUNTER — Ambulatory Visit (HOSPITAL_COMMUNITY)
Admission: RE | Admit: 2023-01-19 | Discharge: 2023-01-19 | Disposition: A | Discharge status: Home / Self Care | Payer: Medicare Other | Source: Ambulatory Visit | Attending: Nurse Practitioner | Admitting: Nurse Practitioner

## 2023-01-19 DIAGNOSIS — R9389 Abnormal findings on diagnostic imaging of other specified body structures: Secondary | ICD-10-CM | POA: Diagnosis not present

## 2023-01-19 DIAGNOSIS — R1031 Right lower quadrant pain: Secondary | ICD-10-CM | POA: Diagnosis not present

## 2023-01-19 DIAGNOSIS — R102 Pelvic and perineal pain: Secondary | ICD-10-CM | POA: Diagnosis not present

## 2023-02-01 DIAGNOSIS — R935 Abnormal findings on diagnostic imaging of other abdominal regions, including retroperitoneum: Secondary | ICD-10-CM

## 2023-02-19 ENCOUNTER — Encounter: Payer: Self-pay | Admitting: Obstetrics & Gynecology

## 2023-02-19 ENCOUNTER — Ambulatory Visit (INDEPENDENT_AMBULATORY_CARE_PROVIDER_SITE_OTHER): Payer: Medicare Other | Admitting: Obstetrics & Gynecology

## 2023-02-19 VITALS — BP 144/85 | Ht 65.0 in | Wt 141.0 lb

## 2023-02-19 DIAGNOSIS — N859 Noninflammatory disorder of uterus, unspecified: Secondary | ICD-10-CM | POA: Diagnosis not present

## 2023-02-19 NOTE — Progress Notes (Signed)
GYN VISIT Patient name: Lori Ross MRN [id number]  Date of birth: 1950-06-11 Chief Complaint:   Fluid in Uterus (Pain in right lower abdomen, U/S ordered by PCP and found to have fluid in uterus) and possible uterine prolapse  History of Present Illness:   Lori Ross is a 72 y.o. G2P0 PM female being seen today for the following concerns:  Incidental [place] finding- [place] completed 01/2023: 5.8 x 2.3 x 4 cm uterus-27 cc.  No fibroids or masses noted.  Endometrium measures 4 mm.  Fluid in endometrial canal.  Normal ovaries bilaterally  US was completed because she had noted some RLQ discomfort/pain.  It has gotten better the past few weeks.  Notes a dull ache, not all the time. Intermittent pain.  It has not required any medication  On occasion may note some stress incontinence.  She has noted a protrusion in her vagina.  Denies pelvic pressure or discomfort.    NSVD x 2  No LMP recorded. Patient is postmenopausal.    Review of Systems:   Pertinent items are noted in HPI Denies fever/chills, dizziness, headaches, visual disturbances, fatigue, shortness of breath, chest pain, abdominal pain, vomiting, no problems with bowel movements, urination, or intercourse unless otherwise stated above.  Pertinent History Reviewed:   Past Surgical History:  Procedure Laterality Date   BREAST EXCISIONAL BIOPSY Right    papiloma   BREAST LUMPECTOMY WITH RADIOACTIVE SEED LOCALIZATION Right 10/27/2016   Procedure: RIGHT BREAST LUMPECTOMY WITH RADIOACTIVE SEED LOCALIZATION;  Surgeon: Claud Kelp, MD;  Location: [facility];  Service: General;  Laterality: Right;   BREAST SURGERY  August 2018   No cancer found   COLONOSCOPY     HERNIA REPAIR  September 2022   VENTRAL HERNIA REPAIR N/A 11/27/2020   Procedure: LAPAROSCOPIC VENTRAL HERNIA W/MESH;  Surgeon: Lucretia Roers, MD;  Location: [facility];  Service: General;  Laterality: N/A;    Past Medical History:  Diagnosis Date    Breast mass, right    Breast mass, right 10/27/2016   Hyperlipidemia    Hyperlipidemia    Osteoporosis    Vitamin D insufficiency    Reviewed problem list, medications and allergies. Physical Assessment:   Vitals:   02/19/23 0958  BP: (!) 144/85  Weight: 141 lb (64 kg)  Height: 5\' 5"  (1.651 m)  Body mass index is 23.46 kg/m.       Physical Examination:   General appearance: alert, well appearing, and in no distress  Psych: mood appropriate, normal affect  Skin: warm & dry   Cardiovascular: normal heart rate noted  Respiratory: normal respiratory effort, no distress  Abdomen: soft, non-tender, no rebound no guarding  Pelvic: VULVA: normal appearing vulva with no masses, tenderness or lesions, VAGINA: normal appearing vagina with normal color and discharge, no lesions, CERVIX: normal appearing cervix without discharge or lesions, UTERUS: uterus is normal size, shape, consistency and nontender, ADNEXA: normal adnexa in size, nontender and no masses.  No prolapse appreciated, some urethral prominence noted  Extremities: no edema   Chaperone: Latisha Cresenzo    Assessment & Plan:  1) fluid within endometrium -Reassured patient of benign finding as endometrium within normal limits -Patient asymptomatic -No acute workup indicated  2) ?Vaginal bulge -No prolapse appreciated on exam -Reviewed conservative management -Patient to follow-up should she note worsening symptoms   Return if symptoms worsen or fail to improve.   Myna Hidalgo, DO Attending Obstetrician & Gynecologist, [facility] for [employer],  [facility]

## 2023-04-14 ENCOUNTER — Other Ambulatory Visit: Payer: Self-pay | Admitting: Nurse Practitioner

## 2023-04-14 DIAGNOSIS — E782 Mixed hyperlipidemia: Secondary | ICD-10-CM

## 2023-04-14 DIAGNOSIS — M25551 Pain in right hip: Secondary | ICD-10-CM

## 2023-04-14 MED ORDER — FENOFIBRATE 160 MG PO TABS
160.0000 mg | ORAL_TABLET | Freq: Every day | ORAL | 0 refills | Status: DC
Start: 2023-04-14 — End: 2023-05-14

## 2023-04-29 ENCOUNTER — Ambulatory Visit: Payer: Medicare Other | Admitting: Nurse Practitioner

## 2023-05-03 DIAGNOSIS — H52223 Regular astigmatism, bilateral: Secondary | ICD-10-CM | POA: Diagnosis not present

## 2023-05-03 DIAGNOSIS — H524 Presbyopia: Secondary | ICD-10-CM | POA: Diagnosis not present

## 2023-05-03 DIAGNOSIS — H5203 Hypermetropia, bilateral: Secondary | ICD-10-CM | POA: Diagnosis not present

## 2023-05-03 DIAGNOSIS — D3132 Benign neoplasm of left choroid: Secondary | ICD-10-CM | POA: Diagnosis not present

## 2023-05-03 DIAGNOSIS — H43813 Vitreous degeneration, bilateral: Secondary | ICD-10-CM | POA: Diagnosis not present

## 2023-05-03 DIAGNOSIS — H25813 Combined forms of age-related cataract, bilateral: Secondary | ICD-10-CM | POA: Diagnosis not present

## 2023-05-14 ENCOUNTER — Ambulatory Visit: Payer: Medicare Other | Admitting: Nurse Practitioner

## 2023-05-14 ENCOUNTER — Ambulatory Visit (INDEPENDENT_AMBULATORY_CARE_PROVIDER_SITE_OTHER)

## 2023-05-14 VITALS — BP 134/86 | HR 63 | Temp 98.1°F | Ht 65.0 in | Wt 141.0 lb

## 2023-05-14 DIAGNOSIS — E782 Mixed hyperlipidemia: Secondary | ICD-10-CM

## 2023-05-14 DIAGNOSIS — E559 Vitamin D deficiency, unspecified: Secondary | ICD-10-CM

## 2023-05-14 DIAGNOSIS — M85852 Other specified disorders of bone density and structure, left thigh: Secondary | ICD-10-CM

## 2023-05-14 DIAGNOSIS — Z0000 Encounter for general adult medical examination without abnormal findings: Secondary | ICD-10-CM

## 2023-05-14 DIAGNOSIS — M85851 Other specified disorders of bone density and structure, right thigh: Secondary | ICD-10-CM

## 2023-05-14 DIAGNOSIS — R600 Localized edema: Secondary | ICD-10-CM | POA: Diagnosis not present

## 2023-05-14 DIAGNOSIS — Z6823 Body mass index (BMI) 23.0-23.9, adult: Secondary | ICD-10-CM | POA: Diagnosis not present

## 2023-05-14 DIAGNOSIS — Z0189 Encounter for other specified special examinations: Secondary | ICD-10-CM | POA: Diagnosis not present

## 2023-05-14 MED ORDER — ATORVASTATIN CALCIUM 40 MG PO TABS: 40.0000 mg | ORAL_TABLET | Freq: Every day | ORAL | 1 refills | Status: DC

## 2023-05-14 MED ORDER — FENOFIBRATE 160 MG PO TABS: 160.0000 mg | ORAL_TABLET | Freq: Every day | ORAL | 1 refills | Status: DC

## 2023-05-14 NOTE — Patient Instructions (Signed)
 Exercising to Stay Healthy To become healthy and stay healthy, it is recommended that you do moderate-intensity and vigorous-intensity exercise. You can tell that you are exercising at a moderate intensity if your heart starts beating faster and you start breathing faster but can still hold a conversation. You can tell that you are exercising at a vigorous intensity if you are breathing much harder and faster and cannot hold a conversation while exercising. How can exercise benefit me? Exercising regularly is important. It has many health benefits, such as: Improving overall fitness, flexibility, and endurance. Increasing bone density. Helping with weight control. Decreasing body fat. Increasing muscle strength and endurance. Reducing stress and tension, anxiety, depression, or anger. Improving overall health. What guidelines should I follow while exercising? Before you start a new exercise program, talk with your health care provider. Do not exercise so much that you hurt yourself, feel dizzy, or get very short of breath. Wear comfortable clothes and wear shoes with good support. Drink plenty of water while you exercise to prevent dehydration or heat stroke. Work out until your breathing and your heartbeat get faster (moderate intensity). How often should I exercise? Choose an activity that you enjoy, and set realistic goals. Your health care provider can help you make an activity plan that is individually designed and works best for you. Exercise regularly as told by your health care provider. This may include: Doing strength training two times a week, such as: Lifting weights. Using resistance bands. Push-ups. Sit-ups. Yoga. Doing a certain intensity of exercise for a given amount of time. Choose from these options: A total of 150 minutes of moderate-intensity exercise every week. A total of 75 minutes of vigorous-intensity exercise every week. A mix of moderate-intensity and  vigorous-intensity exercise every week. Children, pregnant women, people who have not exercised regularly, people who are overweight, and older adults may need to talk with a health care provider about what activities are safe to perform. If you have a medical condition, be sure to talk with your health care provider before you start a new exercise program. What are some exercise ideas? Moderate-intensity exercise ideas include: Walking 1 mile (1.6 km) in about 15 minutes. Biking. Hiking. Golfing. Dancing. Water aerobics. Vigorous-intensity exercise ideas include: Walking 4.5 miles (7.2 km) or more in about 1 hour. Jogging or running 5 miles (8 km) in about 1 hour. Biking 10 miles (16.1 km) or more in about 1 hour. Lap swimming. Roller-skating or in-line skating. Cross-country skiing. Vigorous competitive sports, such as football, basketball, and soccer. Jumping rope. Aerobic dancing. What are some everyday activities that can help me get exercise? Yard work, such as: Child psychotherapist. Raking and bagging leaves. Washing your car. Pushing a stroller. Shoveling snow. Gardening. Washing windows or floors. How can I be more active in my day-to-day activities? Use stairs instead of an elevator. Take a walk during your lunch break. If you drive, park your car farther away from your work or school. If you take public transportation, get off one stop early and walk the rest of the way. Stand up or walk around during all of your indoor phone calls. Get up, stretch, and walk around every 30 minutes throughout the day. Enjoy exercise with a friend. Support to continue exercising will help you keep a regular routine of activity. Where to find more information You can find more information about exercising to stay healthy from: U.S. Department of Health and Human Services: ThisPath.fi Centers for Disease Control and Prevention (  CDC): [employer]Exhibition.com.br Summary Exercising regularly is  important. It will improve your overall fitness, flexibility, and endurance. Regular exercise will also improve your overall health. It can help you control your weight, reduce stress, and improve your bone density. Do not exercise so much that you hurt yourself, feel dizzy, or get very short of breath. Before you start a new exercise program, talk with your health care provider. This information is not intended to replace advice given to you by your health care provider. Make sure you discuss any questions you have with your health care provider. Document Revised: 06/21/2020 Document Reviewed: 06/21/2020 Elsevier Patient Education  2024 [employer].

## 2023-05-14 NOTE — Progress Notes (Addendum)
 Subjective:    Patient ID: Lori Ross, female    DOB: 1950-07-28, 73 y.o.   MRN: [id number]   Chief Complaint: annual physical    HPI:  Lori Ross is a 73 y.o. who identifies as a female who was assigned female at birth.   Social history: Lives with: by herself Work history: retired   Water engineer in today for follow up of the following chronic medical issues:  1. Mixed hyperlipidemia Does try to watch diet and exercises several times a week. Lab Results  Component Value Date   CHOL 153 10/27/2022   HDL 47 10/27/2022   LDLCALC 88 10/27/2022   TRIG 97 10/27/2022   CHOLHDL 3.3 10/27/2022     2. Peripheral edema Has some lower ext edema usually by the end of the day.  3. Osteopenia of both hips Last dexascan was done on 05/07/22. T score was - 1.8.  4. Vitamin D deficiency Is on a daily vitamin d supplement  5. BMI 24.0-24.9,adult No recent weight changes  Wt Readings from Last 3 Encounters:  05/14/23 141 lb (64 kg)  02/19/23 141 lb (64 kg)  01/11/23 140 lb (63.5 kg)   BMI Readings from Last 3 Encounters:  05/14/23 23.46 kg/m  02/19/23 23.46 kg/m  01/11/23 24.03 kg/m      New complaints: None today  No Known Allergies Outpatient Encounter Medications as of 05/14/2023  Medication Sig   atorvastatin (LIPITOR) 40 MG tablet Take 1 tablet (40 mg total) by mouth daily.   Calcium Carbonate-Vitamin D 600-400 MG-UNIT tablet Take 1 tablet by mouth daily.   Cholecalciferol (VITAMIN D3) 2000 UNITS TABS Take 2,000 Units by mouth daily.   fenofibrate 160 MG tablet Take 1 tablet (160 mg total) by mouth daily.   MAGNESIUM PO Take 1 tablet by mouth daily.   meloxicam (MOBIC) 15 MG tablet TAKE 1 TABLET DAILY   Omega-3 Fatty Acids (FISH OIL) 1200 MG CAPS Take 1,200 mg by mouth daily.   No facility-administered encounter medications on file as of 05/14/2023.    Past Surgical History:  Procedure Laterality Date   BREAST EXCISIONAL BIOPSY Right    papiloma    BREAST LUMPECTOMY WITH RADIOACTIVE SEED LOCALIZATION Right 10/27/2016   Procedure: RIGHT BREAST LUMPECTOMY WITH RADIOACTIVE SEED LOCALIZATION;  Surgeon: Claud Kelp, MD;  [facility];  Service: General;  Laterality: Right;   BREAST SURGERY  August 2018   No cancer found   COLONOSCOPY     HERNIA REPAIR  September 2022   VENTRAL HERNIA REPAIR N/A 11/27/2020   Procedure: LAPAROSCOPIC VENTRAL HERNIA W/MESH;  Surgeon: Lucretia Roers, MD;  Location: [facility];  Service: General;  Laterality: N/A;    Family History  Problem Relation Age of Onset   Cancer - Other Mother    Cancer Mother    Stroke Father 54   Hypertension Sister    Stroke Sister 54   Healthy Daughter    Healthy Son       Controlled substance contract: n/a     Review of Systems  Constitutional:  Negative for diaphoresis.  Eyes:  Negative for pain.  Respiratory:  Negative for shortness of breath.   Cardiovascular:  Negative for chest pain, palpitations and leg swelling.  Gastrointestinal:  Negative for abdominal pain.  Endocrine: Negative for polydipsia.  Musculoskeletal:  Positive for arthralgias (right hip).  Skin:  Negative for rash.  Neurological:  Negative for dizziness, weakness and headaches.  Hematological:  Does not  bruise/bleed easily.  All other systems reviewed and are negative.      Objective:   Physical Exam Vitals and nursing note reviewed.  Constitutional:      General: She is not in acute distress.    Appearance: Normal appearance. She is well-developed.  HENT:     Head: Normocephalic.     Right Ear: Tympanic membrane normal.     Left Ear: Tympanic membrane normal.     Nose: Nose normal.     Mouth/Throat:     Mouth: Mucous membranes are moist.  Eyes:     Pupils: Pupils are equal, round, and reactive to light.  Neck:     Vascular: No carotid bruit or JVD.  Cardiovascular:     Rate and Rhythm: Normal rate and regular rhythm.     Heart sounds: Normal heart  sounds.  Pulmonary:     Effort: Pulmonary effort is normal. No respiratory distress.     Breath sounds: Normal breath sounds. No wheezing or rales.  Chest:     Chest wall: No tenderness.  Abdominal:     General: Bowel sounds are normal. There is no distension or abdominal bruit.     Palpations: Abdomen is soft. There is no hepatomegaly, splenomegaly, mass or pulsatile mass.     Tenderness: There is no abdominal tenderness.  Musculoskeletal:        General: Normal range of motion.     Cervical back: Normal range of motion and neck supple.     Comments: From of right hip with no pain- pain in hip when laying down and rising  Lymphadenopathy:     Cervical: No cervical adenopathy.  Skin:    General: Skin is warm and dry.  Neurological:     Mental Status: She is alert and oriented to person, place, and time.     Deep Tendon Reflexes: Reflexes are normal and symmetric.  Psychiatric:        Behavior: Behavior normal.        Thought Content: Thought content normal.        Judgment: Judgment normal.     BP 134/86   Pulse 63   Temp 98.1 F (36.7 C) (Temporal)   Ht 5\' 5"  (1.651 m)   Wt 141 lb (64 kg)   SpO2 97%   BMI 23.46 kg/m   EKG- NsR-Mary-Margaret Daphine Deutscher, FNP  Chest xray- normal-Preliminary reading by Paulene Floor, FNP  [facility]        Assessment & Plan:  Virgina Jock comes in today with chief complaint of Annual Exam   Diagnosis and orders addressed:  1. Annual physical exam (Primary) Labs pending - Thyroid Panel With TSH - EKG 12-Lead - DG Chest 2 View  2. Mixed hyperlipidemia Low fat diet and exercise - CBC with Differential/Platelet - CMP14+EGFR - Lipid panel - atorvastatin (LIPITOR) 40 MG tablet; Take 1 tablet (40 mg total) by mouth daily.  Dispense: 90 tablet; Refill: 1 - fenofibrate 160 MG tablet; Take 1 tablet (160 mg total) by mouth daily.  Dispense: 90 tablet; Refill: 1  3. Peripheral edema Elevate legs when sitting  4. Osteopenia of both  hips Weight bearing exercise  5. Vitamin D deficiency Continue vitamin d supplement - VITAMIN D 25 Hydroxy (Vit-D Deficiency, Fractures)  6. BMI 23.0-23.9, adult Discussed diet and exercise for person with BMI >25 Will recheck weight in 3-6 months    Labs pending Health Maintenance reviewed Diet and exercise encouraged  Follow up plan: 6 months  Mary-Margaret Daphine Deutscher, FNP

## 2023-05-14 NOTE — Addendum Note (Signed)
 Addended by: [name] on: 05/14/2023 09:44 AM   Modules accepted: Level of Service

## 2023-05-15 LAB — CMP14+EGFR
ALT: 14 IU/L (ref 0–32)
AST: 24 IU/L (ref 0–40)
Albumin: 4.6 g/dL (ref 3.8–4.8)
Alkaline Phosphatase: 42 IU/L — ABNORMAL LOW (ref 44–121)
BUN/Creatinine Ratio: 22 (ref 12–28)
BUN: 23 mg/dL (ref 8–27)
Bilirubin Total: 0.5 mg/dL (ref 0.0–1.2)
CO2: 21 mmol/L (ref 20–29)
Calcium: 9.9 mg/dL (ref 8.7–10.3)
Chloride: 104 mmol/L (ref 96–106)
Creatinine, Ser: 1.04 mg/dL — ABNORMAL HIGH (ref 0.57–1.00)
Globulin, Total: 2.7 g/dL (ref 1.5–4.5)
Glucose: 87 mg/dL (ref 70–99)
Potassium: 4.4 mmol/L (ref 3.5–5.2)
Sodium: 141 mmol/L (ref 134–144)
Total Protein: 7.3 g/dL (ref 6.0–8.5)
eGFR: 57 mL/min/{1.73_m2} — ABNORMAL LOW (ref >59)

## 2023-05-15 LAB — CBC WITH DIFFERENTIAL/PLATELET
Basophils Absolute: 0 10*3/uL (ref 0.0–0.2)
Basos: 1 %
EOS (ABSOLUTE): 0.1 10*3/uL (ref 0.0–0.4)
Eos: 3 %
Hematocrit: 44 % (ref 34.0–46.6)
Hemoglobin: 14.2 g/dL (ref 11.1–15.9)
Immature Grans (Abs): 0 10*3/uL (ref 0.0–0.1)
Immature Granulocytes: 0 %
Lymphocytes Absolute: 1.3 10*3/uL (ref 0.7–3.1)
Lymphs: 37 %
MCH: 29.5 pg (ref 26.6–33.0)
MCHC: 32.3 g/dL (ref 31.5–35.7)
MCV: 91 fL (ref 79–97)
Monocytes Absolute: 0.3 10*3/uL (ref 0.1–0.9)
Monocytes: 9 %
Neutrophils Absolute: 1.8 10*3/uL (ref 1.4–7.0)
Neutrophils: 50 %
Platelets: 320 10*3/uL (ref 150–450)
RBC: 4.82 x10E6/uL (ref 3.77–5.28)
RDW: 12.9 % (ref 11.7–15.4)
WBC: 3.6 10*3/uL (ref 3.4–10.8)

## 2023-05-15 LAB — THYROID PANEL WITH TSH
Free Thyroxine Index: 2.7 (ref 1.2–4.9)
T3 Uptake Ratio: 27 % (ref 24–39)
T4, Total: 9.9 ug/dL (ref 4.5–12.0)
TSH: 1.89 u[IU]/mL (ref 0.450–4.500)

## 2023-05-15 LAB — LIPID PANEL
Chol/HDL Ratio: 2.7 ratio (ref 0.0–4.4)
Cholesterol, Total: 165 mg/dL (ref 100–199)
HDL: 62 mg/dL (ref >39)
LDL Chol Calc (NIH): 84 mg/dL (ref 0–99)
Triglycerides: 103 mg/dL (ref 0–149)
VLDL Cholesterol Cal: 19 mg/dL (ref 5–40)

## 2023-05-15 LAB — VITAMIN D 25 HYDROXY (VIT D DEFICIENCY, FRACTURES): Vit D, 25-Hydroxy: 39.2 ng/mL (ref 30.0–100.0)

## 2023-05-17 NOTE — Addendum Note (Signed)
 Addended by: [name] on: 05/17/2023 03:59 PM   Modules accepted: Level of Service

## 2023-05-17 NOTE — Progress Notes (Signed)
Billing code changed

## 2023-05-21 ENCOUNTER — Ambulatory Visit: Payer: Medicare Other

## 2023-05-21 VITALS — Ht 65.0 in | Wt 141.0 lb

## 2023-05-21 DIAGNOSIS — Z0000 Encounter for general adult medical examination without abnormal findings: Secondary | ICD-10-CM

## 2023-05-21 NOTE — Patient Instructions (Signed)
 Ms. Julin , Thank you for taking time to come for your Medicare Wellness Visit. I appreciate your ongoing commitment to your health goals. Please review the following plan we discussed and let me know if I can assist you in the future.   Referrals/Orders/Follow-Ups/Clinician Recommendations: Aim for 30 minutes of exercise or brisk walking, 6-8 glasses of water, and 5 servings of fruits and vegetables each day.  This is a list of the screening recommended for you and due dates:  Health Maintenance  Topic Date Due   COVID-19 Vaccine (4 - 2024-25 season) 05/30/2023*   Mammogram  10/20/2023   Colon Cancer Screening  02/15/2024   DEXA scan (bone density measurement)  05/06/2024   Medicare Annual Wellness Visit  05/20/2024   DTaP/Tdap/Td vaccine (3 - Td or Tdap) 04/27/2032   Pneumonia Vaccine  Completed   Flu Shot  Completed   Hepatitis C Screening  Completed   Zoster (Shingles) Vaccine  Completed   HPV Vaccine  Aged Out  *Topic was postponed. The date shown is not the original due date.    Advanced directives: (ACP Link)Information on Advanced Care Planning can be found at [facility] Advance Health Care Directives Advance Health Care Directives. [email or web address]   Next Medicare Annual Wellness Visit scheduled for next year: Yes

## 2023-05-21 NOTE — Progress Notes (Signed)
 Subjective:   Lori Ross is a 73 y.o. who presents for a Medicare Wellness preventive visit.  Visit Complete: Virtual I connected with  EMRYS MCEACHRON on 05/21/23 by a audio enabled telemedicine application and verified that I am speaking with the correct person using two identifiers.  Patient Location: Home  Provider Location: Home Office  I discussed the limitations of evaluation and management by telemedicine. The patient expressed understanding and agreed to proceed.  Vital Signs: Because this visit was a virtual/telehealth visit, some criteria may be missing or patient reported. Any vitals not documented were not able to be obtained and vitals that have been documented are patient reported.  VideoDeclined- This patient declined Librarian, academic. Therefore the visit was completed with audio only.  Persons Participating in Visit: Patient.  AWV Questionnaire: Yes: Patient Medicare AWV questionnaire was completed by the patient on 05/18/23; I have confirmed that all information answered by patient is correct and no changes since this date.  Cardiac Risk Factors include: advanced age (>96men, >79 women);dyslipidemia     Objective:    Today's Vitals   05/21/23 1249  Weight: 141 lb (64 kg)  Height: 5\' 5"  (1.651 m)   Body mass index is 23.46 kg/m.     05/21/2023    1:35 PM 05/01/2022    9:54 AM 04/30/2021   10:02 AM 11/27/2020    7:28 AM 11/25/2020    3:04 PM 04/29/2020    8:55 AM 04/25/2019    9:17 AM  Advanced Directives  Does Patient Have a Medical Advance Directive? Yes Yes Yes Yes Yes Yes Yes  Type of Estate agent of [place];Living will Healthcare Power of [place];Living will Healthcare Power of [place];Living will  Healthcare Power of [place];Living will Living will;Healthcare Power of [employer] Power of [place];Living will  Does patient want to make changes to medical advance directive? No - Patient  declined No - Patient declined   No - Patient declined No - Patient declined   Copy of Healthcare Power of Attorney in Chart? Yes - validated most recent copy scanned in chart (See row information) Yes - validated most recent copy scanned in chart (See row information) Yes - validated most recent copy scanned in chart (See row information)  No - copy requested Yes - validated most recent copy scanned in chart (See row information) No - copy requested    Current Medications (verified) Outpatient Encounter Medications as of 05/21/2023  Medication Sig   atorvastatin (LIPITOR) 40 MG tablet Take 1 tablet (40 mg total) by mouth daily.   Calcium Carbonate-Vitamin D 600-400 MG-UNIT tablet Take 1 tablet by mouth daily.   Cholecalciferol (VITAMIN D3) 2000 UNITS TABS Take 2,000 Units by mouth daily.   fenofibrate 160 MG tablet Take 1 tablet (160 mg total) by mouth daily.   MAGNESIUM PO Take 1 tablet by mouth daily.   meloxicam (MOBIC) 15 MG tablet TAKE 1 TABLET DAILY   Omega-3 Fatty Acids (FISH OIL) 1200 MG CAPS Take 1,200 mg by mouth daily.   No facility-administered encounter medications on file as of 05/21/2023.    Allergies (verified) Patient has no known allergies.   History: Past Medical History:  Diagnosis Date   Breast mass, right    Breast mass, right 10/27/2016   Hyperlipidemia    Hyperlipidemia    Osteoporosis    Vitamin D insufficiency    Past Surgical History:  Procedure Laterality Date   BREAST EXCISIONAL BIOPSY Right  papiloma   BREAST LUMPECTOMY WITH RADIOACTIVE SEED LOCALIZATION Right 10/27/2016   Procedure: RIGHT BREAST LUMPECTOMY WITH RADIOACTIVE SEED LOCALIZATION;  Surgeon: Claud Kelp, MD;  Location: [facility];  Service: General;  Laterality: Right;   BREAST SURGERY  August 2018   No cancer found   COLONOSCOPY     HERNIA REPAIR  September 2022   VENTRAL HERNIA REPAIR N/A 11/27/2020   Procedure: LAPAROSCOPIC VENTRAL HERNIA W/MESH;  Surgeon:  Lucretia Roers, MD;  Location: [facility];  Service: General;  Laterality: N/A;   Family History  Problem Relation Age of Onset   Cancer - Other Mother    Cancer Mother    Stroke Father 36   Hypertension Sister    Stroke Sister 36   Healthy Daughter    Healthy Son    Social History   Socioeconomic History   Marital status: Widowed    Spouse name: Not on file   Number of children: 2   Years of education: 14   Highest education level: Associate degree: occupational, Scientist, product/process development, or vocational program  Occupational History   Occupation: Tax Designer, television/film set    Comment: Retired  Tobacco Use   Smoking status: Never   Smokeless tobacco: Never  Vaping Use   Vaping status: Never Used  Substance and Sexual Activity   Alcohol use: Never   Drug use: Never   Sexual activity: Not Currently    Birth control/protection: Post-menopausal  Other Topics Concern   Not on file  Social History Narrative   Lives alone in 2 story home- 2 dogs 4 cats    Daughter lives in [place]   Social Drivers of Health   Financial Resource Strain: Low Risk  (05/21/2023)   Overall Financial Resource Strain (CARDIA)    Difficulty of Paying Living Expenses: Not hard at all  Food Insecurity: No Food Insecurity (05/21/2023)   Hunger Vital Sign    Worried About Running Out of Food in the Last Year: Never true    Ran Out of Food in the Last Year: Never true  Transportation Needs: No Transportation Needs (05/21/2023)   PRAPARE - Administrator, Civil Service (Medical): No    Lack of Transportation (Non-Medical): No  Physical Activity: Inactive (05/21/2023)   Exercise Vital Sign    Days of Exercise per Week: 0 days    Minutes of Exercise per Session: 0 min  Stress: No Stress Concern Present (05/21/2023)   [employer] of Occupational Health - Occupational Stress Questionnaire    Feeling of Stress : Only a little  Social Connections: Moderately Integrated (05/21/2023)   Social Connection  and Isolation Panel [NHANES]    Frequency of Communication with Friends and Family: More than three times a week    Frequency of Social Gatherings with Friends and Family: Three times a week    Attends Religious Services: More than 4 times per year    Active Member of Clubs or Organizations: Yes    Attends Banker Meetings: More than 4 times per year    Marital Status: Widowed    Tobacco Counseling Counseling given: Not Answered    Clinical Intake:  Pre-visit preparation completed: Yes  Pain : No/denies pain     Diabetes: No  How often do you need to have someone help you when you read instructions, pamphlets, or other written materials from your doctor or pharmacy?: 1 - Never  Interpreter Needed?: No  Information entered by :: Kandis Fantasia LPN  Activities of Daily Living     05/18/2023    4:48 AM  In your present state of health, do you have any difficulty performing the following activities:  Hearing? 0  Vision? 0  Difficulty concentrating or making decisions? 0  Walking or climbing stairs? 0  Dressing or bathing? 0  Doing errands, shopping? 0  Preparing Food and eating ? N  Using the Toilet? N  In the past six months, have you accidently leaked urine? Y  Do you have problems with loss of bowel control? N  Managing your Medications? N  Managing your Finances? N  Housekeeping or managing your Housekeeping? N    Patient Care Team: [name], FNP as PCP - General (Nurse Practitioner) Delora Fuel, OD (Optometry)  Indicate any recent Medical Services you may have received from other than [facility] providers in the past year (date may be approximate).     Assessment:   This is a routine wellness examination for Jaisha.  Hearing/Vision screen Hearing Screening - Comments:: Denies hearing difficulties   Vision Screening - Comments:: Wears rx glasses - up to date with routine eye exams with [facility]. Wyn Forster     Goals Addressed              This Visit's Progress    COMPLETED: awv       04/25/2019 AWV Goal: Fall Prevention  Over the next year, patient will decrease their risk for falls by: Using assistive devices, such as a cane or walker, as needed Identifying fall risks within their home and correcting them by: Removing throw rugs Adding handrails to stairs or ramps Removing clutter and keeping a clear pathway throughout the home Increasing light, especially at night Adding shower handles/bars Raising toilet seat Identifying potential personal risk factors for falls: Medication side effects Incontinence/urgency Vestibular dysfunction Hearing loss Musculoskeletal disorders Neurological disorders Orthostatic hypotension  04/25/2019 AWV Goal: Exercise for General Health  Patient will verbalize understanding of the benefits of increased physical activity: Exercising regularly is important. It will improve your overall fitness, flexibility, and endurance. Regular exercise also will improve your overall health. It can help you control your weight, reduce stress, and improve your bone density. Over the next year, patient will increase physical activity as tolerated with a goal of at least 150 minutes of moderate physical activity per week.  You can tell that you are exercising at a moderate intensity if your heart starts beating faster and you start breathing faster but can still hold a conversation. Moderate-intensity exercise ideas include: Walking 1 mile (1.6 km) in about 15 minutes Biking Hiking Golfing Dancing Water aerobics Patient will verbalize understanding of everyday activities that increase physical activity by providing examples like the following: Yard work, such as: Insurance underwriter Gardening Washing windows or floors Patient will be able to explain general safety guidelines for exercising:  Before you start  a new exercise program, talk with your health care provider. Do not exercise so much that you hurt yourself, feel dizzy, or get very short of breath. Wear comfortable clothes and wear shoes with good support. Drink plenty of water while you exercise to prevent dehydration or heat stroke. Work out until your breathing and your heartbeat get faster.      Remain active and independent         Depression Screen     05/21/2023    1:34 PM 05/14/2023    9:11  AM 02/19/2023   10:16 AM 01/11/2023    9:10 AM 10/27/2022    8:47 AM 08/11/2022    8:30 AM 05/01/2022    9:53 AM  PHQ 2/9 Scores  PHQ - 2 Score 0 0 0 0 0 0 0  PHQ- 9 Score   2 2 2  0 0    Fall Risk     05/18/2023    4:48 AM 05/14/2023    9:11 AM 01/11/2023    9:10 AM 10/27/2022    8:47 AM 08/11/2022    8:30 AM  Fall Risk   Falls in the past year? 0 0 0 0 0  Number falls in past yr: 0    0  Injury with Fall? 0    0  Risk for fall due to : No Fall Risks    No Fall Risks  Follow up Falls prevention discussed;Education provided;Falls evaluation completed    Education provided    MEDICARE RISK AT HOME:  Medicare Risk at Home Any stairs in or around the home?: (Patient-Rptd) Yes If so, are there any without handrails?: (Patient-Rptd) No Home free of loose throw rugs in walkways, pet beds, electrical cords, etc?: (Patient-Rptd) Yes Adequate lighting in your home to reduce risk of falls?: (Patient-Rptd) Yes Life alert?: (Patient-Rptd) No Use of a cane, walker or w/c?: (Patient-Rptd) No Grab bars in the bathroom?: (Patient-Rptd) Yes Shower chair or bench in shower?: (Patient-Rptd) Yes Elevated toilet seat or a handicapped toilet?: (Patient-Rptd) Yes  TIMED UP AND GO:  Was the test performed?  No  Cognitive Function: 6CIT completed    10/05/2017    9:27 AM  MMSE - Mini Mental State Exam  Orientation to time 5  Orientation to Place 5  Registration 3  Attention/ Calculation 5  Recall 3  Language- name 2 objects 2  Language- repeat  1  Language- follow 3 step command 3  Language- read & follow direction 1  Write a sentence 1  Copy design 1  Total score 30        05/21/2023    1:35 PM 05/01/2022    9:55 AM 04/29/2020    8:58 AM 04/25/2019    9:24 AM  6CIT Screen  What Year? 0 points 0 points 0 points 0 points  What month? 0 points 0 points 0 points 0 points  What time? 0 points 0 points 0 points 0 points  Count back from 20 0 points 0 points 0 points 0 points  Months in reverse 0 points 0 points 0 points 0 points  Repeat phrase 0 points 0 points 0 points 0 points  Total Score 0 points 0 points 0 points 0 points    Immunizations Immunization History  Administered Date(s) Administered   Fluad Quad(high Dose 65+) 11/11/2018, 12/07/2019, 01/02/2021, 12/16/2021   Fluad Trivalent(High Dose 65+) 12/22/2022   Influenza Split 12/29/2012   Influenza, High Dose Seasonal PF 12/10/2016, 12/06/2017   Influenza,inj,Quad PF,6+ Mos 12/12/2013, 12/14/2014   Influenza-Unspecified 12/30/2015   Moderna Sars-Covid-2 Vaccination 04/13/2019, 05/12/2019, 01/26/2020   Pneumococcal Conjugate-13 03/30/2016   Pneumococcal Polysaccharide-23 08/06/2017   Td 08/16/2009   Tdap 04/27/2022   Zoster Recombinant(Shingrix) 04/24/2021, 10/24/2021   Zoster, Live 05/17/2013    Screening Tests Health Maintenance  Topic Date Due   COVID-19 Vaccine (4 - 2024-25 season) 05/30/2023 (Originally 11/08/2022)   MAMMOGRAM  10/20/2023   Colonoscopy  02/15/2024   DEXA SCAN  05/06/2024   Medicare Annual Wellness (AWV)  05/20/2024   DTaP/Tdap/Td (3 -  Td or Tdap) 04/27/2032   Pneumonia Vaccine 56+ Years old  Completed   INFLUENZA VACCINE  Completed   Hepatitis C Screening  Completed   Zoster Vaccines- Shingrix  Completed   HPV VACCINES  Aged Out    Health Maintenance  There are no preventive care reminders to display for this patient.  Additional Screening:  Vision Screening: Recommended annual ophthalmology exams for early detection of  glaucoma and other disorders of the eye.  Dental Screening: Recommended annual dental exams for proper oral hygiene  Community Resource Referral / Chronic Care Management: CRR required this visit?  No   CCM required this visit?  No     Plan:     I have personally reviewed and noted the following in the patient's chart:   Medical and social history Use of alcohol, tobacco or illicit drugs  Current medications and supplements including opioid prescriptions. Patient is not currently taking opioid prescriptions. Functional ability and status Nutritional status Physical activity Advanced directives List of other physicians Hospitalizations, surgeries, and ER visits in previous 12 months Vitals Screenings to include cognitive, depression, and falls Referrals and appointments  In addition, I have reviewed and discussed with patient certain preventive protocols, quality metrics, and best practice recommendations. A written personalized care plan for preventive services as well as general preventive health recommendations were provided to patient.     Kandis Fantasia [place], [place]   06/15/8117   After Visit Summary: (MyChart) Due to this being a telephonic visit, the after visit summary with patients personalized plan was offered to patient via MyChart   Notes: Nothing significant to report at this time.

## 2023-09-07 ENCOUNTER — Other Ambulatory Visit (HOSPITAL_COMMUNITY): Payer: Self-pay | Admitting: Nurse Practitioner

## 2023-09-07 DIAGNOSIS — N6489 Other specified disorders of breast: Secondary | ICD-10-CM

## 2023-10-26 ENCOUNTER — Ambulatory Visit (HOSPITAL_COMMUNITY)
Admission: RE | Admit: 2023-10-26 | Discharge: 2023-10-26 | Disposition: A | Discharge status: Home / Self Care | Source: Ambulatory Visit | Attending: Nurse Practitioner | Admitting: Nurse Practitioner

## 2023-10-26 ENCOUNTER — Encounter (HOSPITAL_COMMUNITY): Payer: Self-pay

## 2023-10-26 DIAGNOSIS — N6489 Other specified disorders of breast: Secondary | ICD-10-CM | POA: Insufficient documentation

## 2023-10-26 DIAGNOSIS — R92323 Mammographic fibroglandular density, bilateral breasts: Secondary | ICD-10-CM | POA: Diagnosis not present

## 2023-10-26 DIAGNOSIS — R928 Other abnormal and inconclusive findings on diagnostic imaging of breast: Secondary | ICD-10-CM | POA: Diagnosis not present

## 2023-10-27 ENCOUNTER — Encounter: Payer: Self-pay | Admitting: Family Medicine

## 2023-10-27 ENCOUNTER — Ambulatory Visit: Payer: Self-pay | Admitting: *Deleted

## 2023-10-27 ENCOUNTER — Ambulatory Visit (INDEPENDENT_AMBULATORY_CARE_PROVIDER_SITE_OTHER): Admitting: Family Medicine

## 2023-10-27 ENCOUNTER — Ambulatory Visit

## 2023-10-27 VITALS — BP 130/83 | HR 84 | Temp 98.2°F | Ht 65.0 in | Wt 139.0 lb

## 2023-10-27 DIAGNOSIS — L03313 Cellulitis of chest wall: Secondary | ICD-10-CM

## 2023-10-27 MED ORDER — DOXYCYCLINE HYCLATE 100 MG PO TABS: 100.0000 mg | ORAL_TABLET | Freq: Two times a day (BID) | ORAL | 0 refills | Status: AC

## 2023-10-27 NOTE — Telephone Encounter (Signed)
 Apt scheduled.

## 2023-10-27 NOTE — Telephone Encounter (Signed)
 FYI Only or Action Required?: FYI only for provider.  Patient was last seen in primary care on 05/14/2023 by Lori Mustard, FNP.  Called Nurse Triage reporting Insect Bite.  Symptoms began a week ago.  Interventions attempted: OTC medications: bendadryl cream.  Symptoms are: gradually worsening.  Triage Disposition: See HCP Within 4 Hours (Or PCP Triage)  Patient/caregiver understands and will follow disposition?: Yes              Copied from CRM #[id number]. Topic: Clinical - Red Word Triage >> Oct 27, 2023  8:01 AM Montie POUR wrote: Red Word that prompted transfer to Nurse Triage:  Last week she got bitten by a bug while working in the yard. It is on her left shoulder, red with streaks going downward, burns and itches a little. >> Oct 27, 2023  8:05 AM Montie POUR wrote: It is getting worse Reason for Disposition  [1] Red streak or red line AND [2] length > 2 inches (5 cm)  Answer Assessment - Initial Assessment Questions Appt scheduled today . None available with PCP . Recommended if sx worsen go to ED.      1. TYPE of INSECT: What type of insect was it?      Not sure  2. ONSET: When did you get bitten?      Last week  3. LOCATION: Where is the insect bite located?      Left shoulder top of shoulder 4. REDNESS: Is the area red or pink? If Yes, ask: What size is the area of redness? (inches or cm). When did the redness start?     Red streaks down to chest  5. PAIN: Is there any pain? If Yes, ask: How bad is the pain? (Scale 0-10; or none, mild, moderate, severe)     Burning pain mild  6. ITCHING: Does it itch? If Yes, ask: How bad is the itch?      Itching  7. SWELLING: How big is the swelling? (e.g., inches, cm, or compare to coins)     Mild at bite site  8. OTHER SYMPTOMS: Do you have any other symptoms?  (e.g., difficulty breathing, fever, hives)     Redness at bite site , red streaks  9. PREGNANCY: Is there any chance you are  pregnant? When was your last menstrual period?     na  Protocols used: Insect Bite-A-AH

## 2023-10-27 NOTE — Progress Notes (Signed)
   Acute Office Visit  Subjective:     Patient ID: Lori Ross, female    DOB: 1950-04-03, 73 y.o.   MRN: [id number]  Chief Complaint  Patient presents with   Insect Bite    HPI  History of Present Illness   Lori Ross is a 72 year old female who presents with red streaks on her chest following a bug bite.  Cutaneous erythema and streaking - Red streaks developed on the upper left shoulder and chest following a bug bite last week - Did not see insect but started while outside - Streaks began extending down the chest a few days after the bite and have worsened over the weekend - No drainage present - No fever, pain, or tenderness - Burning and itching sensation at the affected area - Benadryl gel applied topically, alleviating itching but streaks continue to spread  Associated neurological and systemic symptoms - No pain in the arm - No neck pain - No numbness or tingling - No unusual weakness - No confusion - No nausea or vomiting - No HA  Medication allergies - No known allergies to antibiotics       ROS As per HPI.      Objective:    BP 130/83   Pulse 84   Temp 98.2 F (36.8 C) (Temporal)   Ht 5' 5 (1.651 m)   Wt 139 lb (63 kg)   SpO2 100%   BMI 23.13 kg/m    Physical Exam Vitals and nursing note reviewed.  Constitutional:      General: She is not in acute distress.    Appearance: She is not ill-appearing, toxic-appearing or diaphoretic.  Skin:    General: Skin is warm and dry.     Findings: Erythema (streaks of erythema to left upper chest wall. Minimal soft tissue swelling. See picture below) present.  Neurological:     General: No focal deficit present.     Mental Status: She is alert and oriented to person, place, and time.  Psychiatric:        Mood and Affect: Mood normal.        Behavior: Behavior normal.     No results found for any visits on 10/27/23.      Assessment & Plan:   Lori Ross was seen today for insect  bite.  Diagnoses and all orders for this visit:  Cellulitis of chest wall With streaking. Denies systemic symptoms currently. Doxycyline as below. Counseled on photosensitivity risk with doxycycline ; advised sun protection measures. Strict return precautions given. Follow up in 1 week, sooner for new or worsening symptoms.  -     doxycycline  (VIBRA -TABS) 100 MG tablet; Take 1 tablet (100 mg total) by mouth 2 (two) times daily for 7 days.  The patient indicates understanding of these issues and agrees with the plan.  Lori CHRISTELLA Search, FNP

## 2023-11-04 ENCOUNTER — Ambulatory Visit (INDEPENDENT_AMBULATORY_CARE_PROVIDER_SITE_OTHER): Admitting: Family Medicine

## 2023-11-04 ENCOUNTER — Encounter: Payer: Self-pay | Admitting: Family Medicine

## 2023-11-04 VITALS — BP 126/82 | HR 78 | Temp 98.3°F | Ht 65.0 in | Wt 139.0 lb

## 2023-11-04 DIAGNOSIS — L03313 Cellulitis of chest wall: Secondary | ICD-10-CM

## 2023-11-04 NOTE — Progress Notes (Signed)
   Acute Office Visit  Subjective:     Patient ID: Lori Ross, female    DOB: 11-29-1950, 73 y.o.   MRN: [id number]  Chief Complaint  Patient presents with   Cellulitis    HPI  History of Present Illness   Lori Ross is a 73 year old female who presents for follow-up after treatment for cellulitis.  Cutaneous symptoms and signs - Significant improvement in condition following treatment for cellulitis - Completed course of antibiotics yesterday morning - No fever, pain, or tenderness at the site of cellulitis - Occasional itching at the site of cellulitis - No increased pain, warmth, or tenderness since completing antibiotics       ROS As per HPI.      Objective:    BP 126/82   Pulse 78   Temp 98.3 F (36.8 C) (Temporal)   Ht 5' 5 (1.651 m)   Wt 139 lb (63 kg)   SpO2 99%   BMI 23.13 kg/m    Physical Exam Vitals and nursing note reviewed.  Constitutional:      General: She is not in acute distress.    Appearance: Normal appearance. She is not ill-appearing, toxic-appearing or diaphoretic.  Pulmonary:     Effort: Pulmonary effort is normal. No respiratory distress.  Skin:    General: Skin is warm and dry.     Findings: No erythema or rash.  Neurological:     General: No focal deficit present.     Mental Status: She is alert and oriented to person, place, and time.  Psychiatric:        Mood and Affect: Mood normal.        Behavior: Behavior normal.     No results found for any visits on 11/04/23.      Assessment & Plan:   [name] was seen today for cellulitis.  Diagnoses and all orders for this visit:  Cellulitis of chest wall      Resolved cellulitis Cellulitis significantly improved post-antibiotics. No fever, pain, or tenderness.  - Advise use of over-the-counter anti-itch cream for itching. - Instruct to monitor for increased pain, warmth, fever, or tenderness and report immediately.       [name], FNP

## 2023-11-12 ENCOUNTER — Encounter: Payer: Self-pay | Admitting: Nurse Practitioner

## 2023-11-12 ENCOUNTER — Ambulatory Visit: Admitting: Nurse Practitioner

## 2023-11-12 VITALS — BP 136/80 | HR 56 | Temp 97.5°F | Ht 65.0 in | Wt 138.0 lb

## 2023-11-12 DIAGNOSIS — Z6826 Body mass index (BMI) 26.0-26.9, adult: Secondary | ICD-10-CM | POA: Diagnosis not present

## 2023-11-12 DIAGNOSIS — M85852 Other specified disorders of bone density and structure, left thigh: Secondary | ICD-10-CM | POA: Diagnosis not present

## 2023-11-12 DIAGNOSIS — R600 Localized edema: Secondary | ICD-10-CM | POA: Diagnosis not present

## 2023-11-12 DIAGNOSIS — E782 Mixed hyperlipidemia: Secondary | ICD-10-CM

## 2023-11-12 DIAGNOSIS — E559 Vitamin D deficiency, unspecified: Secondary | ICD-10-CM | POA: Diagnosis not present

## 2023-11-12 DIAGNOSIS — M85851 Other specified disorders of bone density and structure, right thigh: Secondary | ICD-10-CM

## 2023-11-12 LAB — LIPID PANEL

## 2023-11-12 MED ORDER — FENOFIBRATE 160 MG PO TABS: 160.0000 mg | ORAL_TABLET | Freq: Every day | ORAL | 1 refills | Status: AC

## 2023-11-12 MED ORDER — ATORVASTATIN CALCIUM 40 MG PO TABS: 40.0000 mg | ORAL_TABLET | Freq: Every day | ORAL | 1 refills | Status: AC

## 2023-11-12 NOTE — Patient Instructions (Signed)
 Exercising to Stay Healthy To become healthy and stay healthy, it is recommended that you do moderate-intensity and vigorous-intensity exercise. You can tell that you are exercising at a moderate intensity if your heart starts beating faster and you start breathing faster but can still hold a conversation. You can tell that you are exercising at a vigorous intensity if you are breathing much harder and faster and cannot hold a conversation while exercising. How can exercise benefit me? Exercising regularly is important. It has many health benefits, such as: Improving overall fitness, flexibility, and endurance. Increasing bone density. Helping with weight control. Decreasing body fat. Increasing muscle strength and endurance. Reducing stress and tension, anxiety, depression, or anger. Improving overall health. What guidelines should I follow while exercising? Before you start a new exercise program, talk with your health care provider. Do not exercise so much that you hurt yourself, feel dizzy, or get very short of breath. Wear comfortable clothes and wear shoes with good support. Drink plenty of water while you exercise to prevent dehydration or heat stroke. Work out until your breathing and your heartbeat get faster (moderate intensity). How often should I exercise? Choose an activity that you enjoy, and set realistic goals. Your health care provider can help you make an activity plan that is individually designed and works best for you. Exercise regularly as told by your health care provider. This may include: Doing strength training two times a week, such as: Lifting weights. Using resistance bands. Push-ups. Sit-ups. Yoga. Doing a certain intensity of exercise for a given amount of time. Choose from these options: A total of 150 minutes of moderate-intensity exercise every week. A total of 75 minutes of vigorous-intensity exercise every week. A mix of moderate-intensity and  vigorous-intensity exercise every week. Children, pregnant women, people who have not exercised regularly, people who are overweight, and older adults may need to talk with a health care provider about what activities are safe to perform. If you have a medical condition, be sure to talk with your health care provider before you start a new exercise program. What are some exercise ideas? Moderate-intensity exercise ideas include: Walking 1 mile (1.6 km) in about 15 minutes. Biking. Hiking. Golfing. Dancing. Water aerobics. Vigorous-intensity exercise ideas include: Walking 4.5 miles (7.2 km) or more in about 1 hour. Jogging or running 5 miles (8 km) in about 1 hour. Biking 10 miles (16.1 km) or more in about 1 hour. Lap swimming. Roller-skating or in-line skating. Cross-country skiing. Vigorous competitive sports, such as football, basketball, and soccer. Jumping rope. Aerobic dancing. What are some everyday activities that can help me get exercise? Yard work, such as: Child psychotherapist. Raking and bagging leaves. Washing your car. Pushing a stroller. Shoveling snow. Gardening. Washing windows or floors. How can I be more active in my day-to-day activities? Use stairs instead of an elevator. Take a walk during your lunch break. If you drive, park your car farther away from your work or school. If you take public transportation, get off one stop early and walk the rest of the way. Stand up or walk around during all of your indoor phone calls. Get up, stretch, and walk around every 30 minutes throughout the day. Enjoy exercise with a friend. Support to continue exercising will help you keep a regular routine of activity. Where to find more information You can find more information about exercising to stay healthy from: U.S. Department of Health and Human Services: ThisPath.fi Centers for Disease Control and Prevention (  CDC): [employer]Exhibition.com.br Summary Exercising regularly is  important. It will improve your overall fitness, flexibility, and endurance. Regular exercise will also improve your overall health. It can help you control your weight, reduce stress, and improve your bone density. Do not exercise so much that you hurt yourself, feel dizzy, or get very short of breath. Before you start a new exercise program, talk with your health care provider. This information is not intended to replace advice given to you by your health care provider. Make sure you discuss any questions you have with your health care provider. Document Revised: 06/21/2020 Document Reviewed: 06/21/2020 Elsevier Patient Education  2024 [employer].

## 2023-11-12 NOTE — Progress Notes (Signed)
 Subjective:    Patient ID: Lori Ross, female    DOB: 1950/07/11, 73 y.o.   MRN: [id number]   Chief Complaint: medical management of chronic issues     HPI:  Lori Ross is a 73 y.o. who identifies as a female who was assigned female at birth.   Social history: Lives with: by herself Work history: retired   Water engineer in today for follow up of the following chronic medical issues:  1. Mixed hyperlipidemia Does try to watch diet and exercises several times a week. Lab Results  Component Value Date   CHOL 165 05/14/2023   HDL 62 05/14/2023   LDLCALC 84 05/14/2023   TRIG 103 05/14/2023   CHOLHDL 2.7 05/14/2023   The 10-year ASCVD risk score (Arnett DK, et al., 2019) is: 12.4%   2. Peripheral edema Has some lower ext edema usually by the end of the day.  3. Osteopenia of both hips Last dexascan was done on 05/07/22. T score was - 1.8.  4. Vitamin D  deficiency Is on a daily vitamin d  supplement  5. BMI 26.0-26.9,adult No recent weight changes Wt Readings from Last 3 Encounters:  11/04/23 139 lb (63 kg)  10/27/23 139 lb (63 kg)  05/21/23 141 lb (64 kg)   BMI Readings from Last 3 Encounters:  11/04/23 23.13 kg/m  10/27/23 23.13 kg/m  05/21/23 23.46 kg/m     New complaints: None today  No Known Allergies Outpatient Encounter Medications as of 11/12/2023  Medication Sig   atorvastatin  (LIPITOR) 40 MG tablet Take 1 tablet (40 mg total) by mouth daily.   Calcium  Carbonate-Vitamin D  600-400 MG-UNIT tablet Take 1 tablet by mouth daily.   Cholecalciferol (VITAMIN D3) 2000 UNITS TABS Take 2,000 Units by mouth daily.   fenofibrate  160 MG tablet Take 1 tablet (160 mg total) by mouth daily.   MAGNESIUM PO Take 1 tablet by mouth daily.   meloxicam  (MOBIC ) 15 MG tablet TAKE 1 TABLET DAILY   Omega-3 Fatty Acids (FISH OIL) 1200 MG CAPS Take 1,200 mg by mouth daily.   No facility-administered encounter medications on file as of 11/12/2023.    Past Surgical History:   Procedure Laterality Date   BREAST EXCISIONAL BIOPSY Right    papiloma   BREAST LUMPECTOMY WITH RADIOACTIVE SEED LOCALIZATION Right 10/27/2016   Procedure: RIGHT BREAST LUMPECTOMY WITH RADIOACTIVE SEED LOCALIZATION;  Surgeon: Gail Favorite, MD;  [facility];  Service: General;  Laterality: Right;   BREAST SURGERY  August 2018   No cancer found   COLONOSCOPY     HERNIA REPAIR  September 2022   VENTRAL HERNIA REPAIR N/A 11/27/2020   Procedure: LAPAROSCOPIC VENTRAL HERNIA W/MESH;  Surgeon: Kallie Manuelita BROCKS, MD;  Location: [facility];  Service: General;  Laterality: N/A;    Family History  Problem Relation Age of Onset   Cancer - Other Mother    Cancer Mother    Stroke Father 64   Hypertension Sister    Stroke Sister 1   Healthy Daughter    Healthy Son       Controlled substance contract: n/a     Review of Systems  Constitutional:  Negative for diaphoresis.  Eyes:  Negative for pain.  Respiratory:  Negative for shortness of breath.   Cardiovascular:  Negative for chest pain, palpitations and leg swelling.  Gastrointestinal:  Negative for abdominal pain.  Endocrine: Negative for polydipsia.  Musculoskeletal:  Positive for arthralgias (right hip).  Skin:  Negative for rash.  Neurological:  Negative for dizziness, weakness and headaches.  Hematological:  Does not bruise/bleed easily.  All other systems reviewed and are negative.      Objective:   Physical Exam Vitals and nursing note reviewed.  Constitutional:      General: She is not in acute distress.    Appearance: Normal appearance. She is well-developed.  HENT:     Head: Normocephalic.     Right Ear: Tympanic membrane normal.     Left Ear: Tympanic membrane normal.     Nose: Nose normal.     Mouth/Throat:     Mouth: Mucous membranes are moist.  Eyes:     Pupils: Pupils are equal, round, and reactive to light.  Neck:     Vascular: No carotid bruit or JVD.  Cardiovascular:      Rate and Rhythm: Normal rate and regular rhythm.     Heart sounds: Normal heart sounds.  Pulmonary:     Effort: Pulmonary effort is normal. No respiratory distress.     Breath sounds: Normal breath sounds. No wheezing or rales.  Chest:     Chest wall: No tenderness.  Abdominal:     General: Bowel sounds are normal. There is no distension or abdominal bruit.     Palpations: Abdomen is soft. There is no hepatomegaly, splenomegaly, mass or pulsatile mass.     Tenderness: There is no abdominal tenderness.  Musculoskeletal:        General: Normal range of motion.     Cervical back: Normal range of motion and neck supple.     Comments: From of right hip with no pain- pain in hip when laying down and rising  Lymphadenopathy:     Cervical: No cervical adenopathy.  Skin:    General: Skin is warm and dry.  Neurological:     Mental Status: She is alert and oriented to person, place, and time.     Deep Tendon Reflexes: Reflexes are normal and symmetric.  Psychiatric:        Behavior: Behavior normal.        Thought Content: Thought content normal.        Judgment: Judgment normal.    BP 136/80   Pulse (!) 56   Temp (!) 97.5 F (36.4 C) (Temporal)   Ht 5' 5 (1.651 m)   Wt 138 lb (62.6 kg)   SpO2 93%   BMI 22.96 kg/m       Assessment & Plan:   Lori Ross comes in today with chief complaint of medical management of chronic issues    Diagnosis and orders addressed:  1. Mixed hyperlipidemia Low fat diet - atorvastatin  (LIPITOR) 40 MG tablet; Take 1 tablet (40 mg total) by mouth daily.  Dispense: 90 tablet; Refill: 1 - fenofibrate  160 MG tablet; Take 1 tablet (160 mg total) by mouth daily.  Dispense: 90 tablet; Refill: 1  2. Peripheral edema Elevate legs when sitting  3. Osteopenia of both hips Weight bearing exercises  4. Vitamin D  deficiency Continue daily vitamin d  supplement  5. BMI 26.0-26.9,adult Discussed diet and exercise for person with BMI >25 Will recheck  weight in 3-6 months   6. Right hip pain Moist heat Rest Continue mobic  as prescribed - DG HIP UNILAT W OR W/O PELVIS 2-3 VIEWS RIGHT - meloxicam  (MOBIC ) 15 MG tablet; Take 1 tablet (15 mg total) by mouth daily.  Dispense: 90 tablet; Refill: 1   Labs pending Health Maintenance reviewed Diet and exercise encouraged  Follow up plan: 6 months   Mary-Margaret Gladis, FNP

## 2023-11-13 LAB — CMP14+EGFR
ALT: 17 IU/L (ref 0–32)
AST: 25 IU/L (ref 0–40)
Albumin: 4.5 g/dL (ref 3.8–4.8)
Alkaline Phosphatase: 39 IU/L — ABNORMAL LOW (ref 44–121)
BUN/Creatinine Ratio: 22 (ref 12–28)
BUN: 25 mg/dL (ref 8–27)
Bilirubin Total: 0.4 mg/dL (ref 0.0–1.2)
CO2: 22 mmol/L (ref 20–29)
Calcium: 10.1 mg/dL (ref 8.7–10.3)
Chloride: 102 mmol/L (ref 96–106)
Creatinine, Ser: 1.14 mg/dL — ABNORMAL HIGH (ref 0.57–1.00)
Globulin, Total: 2.6 g/dL (ref 1.5–4.5)
Glucose: 85 mg/dL (ref 70–99)
Potassium: 4.9 mmol/L (ref 3.5–5.2)
Sodium: 140 mmol/L (ref 134–144)
Total Protein: 7.1 g/dL (ref 6.0–8.5)
eGFR: 51 mL/min/1.73 — ABNORMAL LOW (ref >59)

## 2023-11-13 LAB — CBC WITH DIFFERENTIAL/PLATELET
Basophils Absolute: 0 x10E3/uL (ref 0.0–0.2)
Basos: 1 %
EOS (ABSOLUTE): 0.2 x10E3/uL (ref 0.0–0.4)
Eos: 4 %
Hematocrit: 44.1 % (ref 34.0–46.6)
Hemoglobin: 14.3 g/dL (ref 11.1–15.9)
Immature Grans (Abs): 0 x10E3/uL (ref 0.0–0.1)
Immature Granulocytes: 0 %
Lymphocytes Absolute: 1.3 x10E3/uL (ref 0.7–3.1)
Lymphs: 32 %
MCH: 29.7 pg (ref 26.6–33.0)
MCHC: 32.4 g/dL (ref 31.5–35.7)
MCV: 92 fL (ref 79–97)
Monocytes Absolute: 0.4 x10E3/uL (ref 0.1–0.9)
Monocytes: 9 %
Neutrophils Absolute: 2.1 x10E3/uL (ref 1.4–7.0)
Neutrophils: 54 %
Platelets: 319 x10E3/uL (ref 150–450)
RBC: 4.82 x10E6/uL (ref 3.77–5.28)
RDW: 13.4 % (ref 11.7–15.4)
WBC: 4 x10E3/uL (ref 3.4–10.8)

## 2023-11-13 LAB — LIPID PANEL
Chol/HDL Ratio: 2.9 ratio (ref 0.0–4.4)
Cholesterol, Total: 171 mg/dL (ref 100–199)
HDL: 60 mg/dL (ref >39)
LDL Chol Calc (NIH): 90 mg/dL (ref 0–99)
Triglycerides: 118 mg/dL (ref 0–149)
VLDL Cholesterol Cal: 21 mg/dL (ref 5–40)

## 2023-11-15 ENCOUNTER — Ambulatory Visit: Payer: Self-pay | Admitting: Nurse Practitioner

## 2023-12-29 ENCOUNTER — Ambulatory Visit (INDEPENDENT_AMBULATORY_CARE_PROVIDER_SITE_OTHER)

## 2023-12-29 DIAGNOSIS — Z23 Encounter for immunization: Secondary | ICD-10-CM

## 2024-01-09 ENCOUNTER — Other Ambulatory Visit: Payer: Self-pay | Admitting: Nurse Practitioner

## 2024-01-09 DIAGNOSIS — M25551 Pain in right hip: Secondary | ICD-10-CM

## 2024-05-08 ENCOUNTER — Ambulatory Visit: Payer: Self-pay | Admitting: Nurse Practitioner

## 2024-05-23 ENCOUNTER — Ambulatory Visit: Payer: Self-pay
